# Patient Record
Sex: Male | Born: 1945 | Race: White | Hispanic: No | Marital: Married | Smoking: Never smoker
Health system: Southern US, Community
[De-identification: ages and names within clinical notes are randomized; demographics above are authoritative.]

## PROBLEM LIST (undated history)

## (undated) ENCOUNTER — Emergency Department (HOSPITAL_COMMUNITY): Source: Home / Self Care

## (undated) DIAGNOSIS — M1990 Unspecified osteoarthritis, unspecified site: Secondary | ICD-10-CM

## (undated) DIAGNOSIS — Z6841 Body Mass Index (BMI) 40.0 and over, adult: Secondary | ICD-10-CM

## (undated) DIAGNOSIS — Z87442 Personal history of urinary calculi: Secondary | ICD-10-CM

## (undated) DIAGNOSIS — G4730 Sleep apnea, unspecified: Secondary | ICD-10-CM

## (undated) DIAGNOSIS — I499 Cardiac arrhythmia, unspecified: Secondary | ICD-10-CM

## (undated) DIAGNOSIS — I10 Essential (primary) hypertension: Secondary | ICD-10-CM

## (undated) DIAGNOSIS — K5190 Ulcerative colitis, unspecified, without complications: Secondary | ICD-10-CM

## (undated) DIAGNOSIS — I739 Peripheral vascular disease, unspecified: Secondary | ICD-10-CM

## (undated) DIAGNOSIS — I82409 Acute embolism and thrombosis of unspecified deep veins of unspecified lower extremity: Secondary | ICD-10-CM

## (undated) HISTORY — PX: FEMUR SURGERY: SHX943

## (undated) HISTORY — PX: COLONOSCOPY W/ POLYPECTOMY: SHX1380

## (undated) HISTORY — DX: Cardiac arrhythmia, unspecified: I49.9

## (undated) HISTORY — DX: Essential (primary) hypertension: I10

## (undated) HISTORY — PX: JOINT REPLACEMENT: SHX530

---

## 1998-08-12 ENCOUNTER — Ambulatory Visit: Admission: RE | Admit: 1998-08-12 | Discharge: 1998-08-12 | Payer: Self-pay | Admitting: Internal Medicine

## 1999-01-29 ENCOUNTER — Encounter: Payer: Self-pay | Admitting: Orthopedic Surgery

## 1999-02-06 ENCOUNTER — Inpatient Hospital Stay (HOSPITAL_COMMUNITY): Admission: RE | Admit: 1999-02-06 | Discharge: 1999-02-12 | Payer: Self-pay | Admitting: Orthopedic Surgery

## 1999-02-06 ENCOUNTER — Encounter: Payer: Self-pay | Admitting: Orthopedic Surgery

## 1999-02-09 ENCOUNTER — Encounter: Payer: Self-pay | Admitting: Orthopedic Surgery

## 1999-09-25 ENCOUNTER — Encounter: Payer: Self-pay | Admitting: Orthopedic Surgery

## 1999-09-25 ENCOUNTER — Inpatient Hospital Stay (HOSPITAL_COMMUNITY): Admission: EM | Admit: 1999-09-25 | Discharge: 1999-09-26 | Payer: Self-pay | Admitting: Emergency Medicine

## 1999-09-25 ENCOUNTER — Encounter: Payer: Self-pay | Admitting: Emergency Medicine

## 2001-07-28 ENCOUNTER — Encounter: Payer: Self-pay | Admitting: Orthopedic Surgery

## 2001-07-28 ENCOUNTER — Encounter: Admission: RE | Admit: 2001-07-28 | Discharge: 2001-07-28 | Payer: Self-pay | Admitting: Orthopedic Surgery

## 2001-11-16 ENCOUNTER — Ambulatory Visit (HOSPITAL_COMMUNITY): Admission: RE | Admit: 2001-11-16 | Discharge: 2001-11-16 | Payer: Self-pay | Admitting: Gastroenterology

## 2003-11-06 ENCOUNTER — Inpatient Hospital Stay (HOSPITAL_COMMUNITY): Admission: EM | Admit: 2003-11-06 | Discharge: 2003-11-13 | Payer: Self-pay | Admitting: Emergency Medicine

## 2005-03-22 ENCOUNTER — Emergency Department (HOSPITAL_COMMUNITY): Admission: EM | Admit: 2005-03-22 | Discharge: 2005-03-22 | Payer: Self-pay | Admitting: Family Medicine

## 2011-02-04 ENCOUNTER — Other Ambulatory Visit: Payer: Self-pay | Admitting: Dermatology

## 2011-02-04 ENCOUNTER — Other Ambulatory Visit: Payer: Self-pay | Admitting: Gastroenterology

## 2011-02-19 ENCOUNTER — Other Ambulatory Visit (HOSPITAL_COMMUNITY): Payer: Self-pay | Admitting: Gastroenterology

## 2011-02-19 DIAGNOSIS — K635 Polyp of colon: Secondary | ICD-10-CM

## 2011-03-20 ENCOUNTER — Other Ambulatory Visit (HOSPITAL_COMMUNITY): Payer: Self-pay

## 2011-05-13 ENCOUNTER — Other Ambulatory Visit (HOSPITAL_COMMUNITY): Payer: Self-pay

## 2011-08-28 ENCOUNTER — Other Ambulatory Visit (HOSPITAL_COMMUNITY): Payer: Self-pay

## 2011-09-06 ENCOUNTER — Other Ambulatory Visit (HOSPITAL_COMMUNITY): Payer: Self-pay

## 2011-10-11 ENCOUNTER — Other Ambulatory Visit (HOSPITAL_COMMUNITY): Payer: Self-pay

## 2011-12-27 ENCOUNTER — Other Ambulatory Visit (HOSPITAL_COMMUNITY): Payer: Self-pay

## 2012-01-20 ENCOUNTER — Other Ambulatory Visit (HOSPITAL_COMMUNITY): Payer: Self-pay

## 2012-02-21 ENCOUNTER — Other Ambulatory Visit (HOSPITAL_COMMUNITY): Payer: Self-pay

## 2012-12-15 ENCOUNTER — Other Ambulatory Visit: Payer: Self-pay | Admitting: Gastroenterology

## 2012-12-15 DIAGNOSIS — Z86010 Personal history of colonic polyps: Secondary | ICD-10-CM

## 2013-01-04 ENCOUNTER — Encounter: Payer: Self-pay | Admitting: Podiatry

## 2013-01-04 ENCOUNTER — Ambulatory Visit (INDEPENDENT_AMBULATORY_CARE_PROVIDER_SITE_OTHER): Payer: PRIVATE HEALTH INSURANCE | Admitting: Podiatry

## 2013-01-04 VITALS — BP 127/65 | HR 75 | Resp 20 | Ht 72.0 in | Wt 320.0 lb

## 2013-01-04 DIAGNOSIS — L03039 Cellulitis of unspecified toe: Secondary | ICD-10-CM

## 2013-01-04 NOTE — Patient Instructions (Addendum)
Ingrown Toenail An ingrown toenail occurs when the sharp edge of a toenail grows into the skin of the groove beside the nail (lateral nail groove), causing pain. Ingrown toenails most commonly occur on the first (big) toe. If left untreated, an ingrown toenail may become inflamed and infected. The infection can even spread throughout the toe. SYMPTOMS   Pain, centered on the nail groove.  Tenderness and inflammation.  Pus drainage (occasionally).  Signs of infection: redness, swelling, pain, warm to the touch. CAUSES  Ingrown toenails are caused when the toenail grows into the neighboring fleshy nail fold. This may be the result of poor nail trimming or pressure from shoes.  RISK INCREASES WITH:   Curved nail formations.  Clipping toenails too far on the sides, which allows tissue to grow over the nail.  Poorly fitted shoes, especially those that press down on the toenails.  Sports that require sudden stops, causing the toe to jam into the shoe. PREVENTION   Trim toenails properly, making sure the sides are not clipped too short.  Wear properly fitted shoes.  Avoid excessive pressure on the toenails. PROGNOSIS  Ingrown toenails are usually curable within 1 week, depending on the presence or absence of an infection.  RELATED COMPLICATIONS   Chronic infection, that cannot be cured without surgery.  Spread of infection throughout the toe, or even the bone. TREATMENT  Treatment first involves resting from aggravating activities, wearing shoes that do not place pressure on the toenails, antibiotics (if infected), and soaking the foot in warm water (with or without Epsom salt). After the foot has been soaked, you may be able to gently lift the nail from the fleshy tissue, and place a bit of cotton ball under the nail, easing the pressure. If you have been prescribed antibiotics, expect relief to begin up to 48 hours after taking the medicine. Symptoms usually go away within 1 week. For  people who suffer from persistent (chronic) ingrown toenails, surgery may be advised. MEDICATION   If pain medicine is needed, nonsteroidal anti-inflammatory medicines (aspirin and ibuprofen), or other minor pain relievers (acetaminophen), are often advised.  Do not take pain medicine for 7 days before surgery.  Stronger pain relievers may be prescribed, if your caregiver thinks they are needed. Use only as directed and only as much as you need.  Antibiotics may be prescribed to fight infection. Take as directed by your caregiver. Finish the entire prescription, even if you start to feel better before you finish it. SOAKS AND COLD THERAPY   Soak the toe (or whole foot) for 20 minutes, twice a day, in a gallon of warm water. You may add 2 tablespoons of Epsom salts or a mild detergent.  Cold treatment (icing) should be applied for 10 to 15 minutes every 2 to 3 hours for inflammation and pain, and immediately after activity that aggravates your symptoms. Use ice packs or an ice massage. SEEK MEDICAL CARE IF:   Symptoms get worse or do not improve in 48 hours, despite treatment.  You develop fever, increased pain and swelling, or signs of infection (pain, redness, tenderness, swelling, warmth) in the toe, after surgery.  New, unexplained symptoms develop. (Drugs used in treatment may produce side effects.) Document Released: 01/07/2005 Document Revised: 04/01/2011 Document Reviewed: 04/21/2008 [facility] Patient Information 2014 [place], [place].   Betadine Soak Instructions  Purchase an 8 oz. bottle of BETADINE solution (Povidone) ANTIBACTERIAL SOAP INSTRUCTIONS  THE DAY AFTER PROCEDURE  Please follow the instructions your doctor has marked.  Shower as usual. Before getting out, place a drop of antibacterial liquid soap (Dial) on a wet, clean washcloth.  Gently wipe washcloth over affected area.  Afterward, rinse the area with warm water.  Blot the area dry with a soft cloth and cover  with antibiotic ointment (neosporin, polysporin, bacitracin) and band aid or gauze and tape  Place 3-4 drops of antibacterial liquid soap in a quart of warm tap water.  Submerge foot into water for 20 minutes.  If bandage was applied after your procedure, leave on to allow for easy lift off, then remove and continue with soak for the remaining time.  Next, blot area dry with a soft cloth and cover with a bandage.  Apply other medications as directed by your doctor, such as cortisporin otic solution (eardrops) or neosporin antibiotic ointment

## 2013-01-04 NOTE — Progress Notes (Signed)
Subjective:     Patient ID: Steven Blair, male   DOB: 1945-04-28, 67 y.o.   MRN: [id number]  HPI patient states that he has a painful ingrown toenail of his left big toe and a rash on top of his right ankle which has been present for a number of years. States the toenail gets sore in it's mostly border towards the second toe   Review of Systems  All other systems reviewed and are negative.       Objective:   Physical Exam  Nursing note and vitals reviewed. Constitutional: He is oriented to person, place, and time. He appears well-developed and well-nourished.  Cardiovascular: Intact distal pulses.   Musculoskeletal: Normal range of motion.  Neurological: He is oriented to person, place, and time.  Skin: Skin is warm.   neurovascular status was moderately diminished and I noted diminished hair growth on both feet. I questioned him on claudication symptoms and he does not appear to have claudication at this time. Left hallux is incurvated lateral border and painful when pressed with the thickened nail and slight red discoloration and dorsum of the right foot has some irritation also     Assessment:     Incurvated left hallux lateral border this painful with mild paronychia infection and rash dorsum right foot appears to be some kind of a localized process    Plan:     H&P was performed and discussed and today I infiltrated the left hallux remove the lateral border proud flesh allowed channel for drainage and applied sterile dressing. Reappoint again in 4 weeks to evaluate healing and consider sending him for circulatory studies depending on response

## 2013-01-04 NOTE — Progress Notes (Signed)
   Subjective:    Patient ID: Steven Blair, male    DOB: Jun 08, 1945, 67 y.o.   MRN: [id number]  HPI Comments: "I've got an ingrown toenail and a rash or something"  Pt has ingrown toenail 1st toe left, both corners for approx few mos. Been trimming back when can.   Pt also has rash area that is red and "crusty" for approx several mos. Been using antibiotic ointment on it. Not sore or itchy.     Review of Systems  Skin: Positive for rash.       Open sores  All other systems reviewed and are negative.       Objective:   Physical Exam        Assessment & Plan:

## 2013-02-01 ENCOUNTER — Ambulatory Visit: Payer: PRIVATE HEALTH INSURANCE | Admitting: Podiatry

## 2013-02-01 ENCOUNTER — Other Ambulatory Visit: Payer: Self-pay

## 2013-03-29 ENCOUNTER — Other Ambulatory Visit: Payer: Self-pay

## 2013-04-28 ENCOUNTER — Other Ambulatory Visit: Payer: Self-pay | Admitting: Gastroenterology

## 2013-05-24 ENCOUNTER — Other Ambulatory Visit: Payer: Self-pay

## 2013-06-07 ENCOUNTER — Other Ambulatory Visit: Payer: Self-pay

## 2013-07-05 ENCOUNTER — Other Ambulatory Visit: Payer: Self-pay

## 2013-08-02 ENCOUNTER — Other Ambulatory Visit: Payer: Self-pay

## 2013-11-22 ENCOUNTER — Other Ambulatory Visit: Payer: Self-pay

## 2014-01-21 HISTORY — PX: EYE SURGERY: SHX253

## 2014-02-14 ENCOUNTER — Other Ambulatory Visit: Payer: Self-pay | Admitting: Dermatology

## 2014-10-27 ENCOUNTER — Other Ambulatory Visit: Payer: Self-pay | Admitting: Gastroenterology

## 2014-10-31 ENCOUNTER — Observation Stay (HOSPITAL_COMMUNITY)
Admission: EM | Admit: 2014-10-31 | Discharge: 2014-11-02 | Disposition: A | Discharge status: Home / Self Care | Payer: No Typology Code available for payment source | Attending: Internal Medicine | Admitting: Internal Medicine

## 2014-10-31 ENCOUNTER — Encounter (HOSPITAL_COMMUNITY): Payer: Self-pay | Admitting: *Deleted

## 2014-10-31 ENCOUNTER — Emergency Department (HOSPITAL_COMMUNITY): Payer: No Typology Code available for payment source

## 2014-10-31 DIAGNOSIS — G4730 Sleep apnea, unspecified: Secondary | ICD-10-CM | POA: Diagnosis not present

## 2014-10-31 DIAGNOSIS — I4891 Unspecified atrial fibrillation: Secondary | ICD-10-CM | POA: Diagnosis not present

## 2014-10-31 DIAGNOSIS — E7800 Pure hypercholesterolemia, unspecified: Secondary | ICD-10-CM | POA: Diagnosis not present

## 2014-10-31 DIAGNOSIS — N200 Calculus of kidney: Secondary | ICD-10-CM | POA: Diagnosis not present

## 2014-10-31 DIAGNOSIS — Z7982 Long term (current) use of aspirin: Secondary | ICD-10-CM | POA: Insufficient documentation

## 2014-10-31 DIAGNOSIS — I480 Paroxysmal atrial fibrillation: Secondary | ICD-10-CM | POA: Diagnosis not present

## 2014-10-31 DIAGNOSIS — M19011 Primary osteoarthritis, right shoulder: Secondary | ICD-10-CM | POA: Diagnosis not present

## 2014-10-31 DIAGNOSIS — Z86718 Personal history of other venous thrombosis and embolism: Secondary | ICD-10-CM

## 2014-10-31 DIAGNOSIS — Z86010 Personal history of colonic polyps: Secondary | ICD-10-CM | POA: Diagnosis not present

## 2014-10-31 DIAGNOSIS — Z9889 Other specified postprocedural states: Secondary | ICD-10-CM | POA: Insufficient documentation

## 2014-10-31 DIAGNOSIS — K439 Ventral hernia without obstruction or gangrene: Secondary | ICD-10-CM | POA: Insufficient documentation

## 2014-10-31 DIAGNOSIS — Z6841 Body Mass Index (BMI) 40.0 and over, adult: Secondary | ICD-10-CM | POA: Diagnosis not present

## 2014-10-31 DIAGNOSIS — E43 Unspecified severe protein-calorie malnutrition: Secondary | ICD-10-CM | POA: Diagnosis not present

## 2014-10-31 DIAGNOSIS — K922 Gastrointestinal hemorrhage, unspecified: Secondary | ICD-10-CM | POA: Diagnosis present

## 2014-10-31 DIAGNOSIS — G4733 Obstructive sleep apnea (adult) (pediatric): Secondary | ICD-10-CM | POA: Diagnosis not present

## 2014-10-31 DIAGNOSIS — A09 Infectious gastroenteritis and colitis, unspecified: Secondary | ICD-10-CM

## 2014-10-31 DIAGNOSIS — R1084 Generalized abdominal pain: Secondary | ICD-10-CM | POA: Diagnosis not present

## 2014-10-31 DIAGNOSIS — R197 Diarrhea, unspecified: Secondary | ICD-10-CM | POA: Diagnosis present

## 2014-10-31 DIAGNOSIS — K5150 Left sided colitis without complications: Secondary | ICD-10-CM | POA: Insufficient documentation

## 2014-10-31 DIAGNOSIS — K529 Noninfective gastroenteritis and colitis, unspecified: Secondary | ICD-10-CM | POA: Diagnosis present

## 2014-10-31 DIAGNOSIS — Z7901 Long term (current) use of anticoagulants: Secondary | ICD-10-CM | POA: Diagnosis not present

## 2014-10-31 DIAGNOSIS — I10 Essential (primary) hypertension: Secondary | ICD-10-CM | POA: Diagnosis present

## 2014-10-31 DIAGNOSIS — I739 Peripheral vascular disease, unspecified: Secondary | ICD-10-CM | POA: Insufficient documentation

## 2014-10-31 DIAGNOSIS — Z882 Allergy status to sulfonamides status: Secondary | ICD-10-CM | POA: Insufficient documentation

## 2014-10-31 DIAGNOSIS — K5130 Ulcerative (chronic) rectosigmoiditis without complications: Principal | ICD-10-CM | POA: Insufficient documentation

## 2014-10-31 LAB — URINALYSIS, ROUTINE W REFLEX MICROSCOPIC
Glucose, UA: NEGATIVE mg/dL
Ketones, ur: 15 mg/dL — AB
Leukocytes, UA: NEGATIVE
Nitrite: NEGATIVE
Protein, ur: NEGATIVE mg/dL
Specific Gravity, Urine: 1.027 (ref 1.005–1.030)
Urobilinogen, UA: 1 mg/dL (ref 0.0–1.0)
pH: 5.5 (ref 5.0–8.0)

## 2014-10-31 LAB — CBC
HCT: 39 % (ref 39.0–52.0)
Hemoglobin: 13.1 g/dL (ref 13.0–17.0)
MCH: 32.3 pg (ref 26.0–34.0)
MCHC: 33.6 g/dL (ref 30.0–36.0)
MCV: 96.1 fL (ref 78.0–100.0)
Platelets: 392 10*3/uL (ref 150–400)
RBC: 4.06 MIL/uL — ABNORMAL LOW (ref 4.22–5.81)
RDW: 12.2 % (ref 11.5–15.5)
WBC: 9.8 10*3/uL (ref 4.0–10.5)

## 2014-10-31 LAB — COMPREHENSIVE METABOLIC PANEL
ALT: 43 U/L (ref 17–63)
AST: 36 U/L (ref 15–41)
Albumin: 2.8 g/dL — ABNORMAL LOW (ref 3.5–5.0)
Alkaline Phosphatase: 59 U/L (ref 38–126)
Anion gap: 13 (ref 5–15)
BUN: 42 mg/dL — ABNORMAL HIGH (ref 6–20)
CO2: 24 mmol/L (ref 22–32)
Calcium: 8.4 mg/dL — ABNORMAL LOW (ref 8.9–10.3)
Chloride: 96 mmol/L — ABNORMAL LOW (ref 101–111)
Creatinine, Ser: 1.25 mg/dL — ABNORMAL HIGH (ref 0.61–1.24)
GFR calc Af Amer: >60 mL/min (ref >60)
GFR calc non Af Amer: 57 mL/min — ABNORMAL LOW (ref >60)
Glucose, Bld: 136 mg/dL — ABNORMAL HIGH (ref 65–99)
Potassium: 3.5 mmol/L (ref 3.5–5.1)
Sodium: 133 mmol/L — ABNORMAL LOW (ref 135–145)
Total Bilirubin: 1.1 mg/dL (ref 0.3–1.2)
Total Protein: 6.7 g/dL (ref 6.5–8.1)

## 2014-10-31 LAB — URINE MICROSCOPIC-ADD ON

## 2014-10-31 LAB — TYPE AND SCREEN
ABO/RH(D): O POS
Antibody Screen: NEGATIVE

## 2014-10-31 LAB — SAMPLE TO BLOOD BANK

## 2014-10-31 LAB — ABO/RH: ABO/RH(D): O POS

## 2014-10-31 LAB — POC OCCULT BLOOD, ED: Fecal Occult Bld: POSITIVE — AB

## 2014-10-31 LAB — LIPASE, BLOOD: Lipase: 19 U/L — ABNORMAL LOW (ref 22–51)

## 2014-10-31 MED ORDER — SODIUM CHLORIDE 0.9 % IJ SOLN
3.0000 mL | Freq: Two times a day (BID) | INTRAMUSCULAR | Status: DC
  Administered 2014-11-01 – 2014-11-02 (×4): 3 mL via INTRAVENOUS

## 2014-10-31 MED ORDER — IOHEXOL 300 MG/ML  SOLN
50.0000 mL | Freq: Once | INTRAMUSCULAR | Status: AC | PRN
  Administered 2014-10-31: 50 mL via ORAL

## 2014-10-31 MED ORDER — MORPHINE SULFATE (PF) 4 MG/ML IV SOLN
4.0000 mg | Freq: Once | INTRAVENOUS | Status: AC
  Administered 2014-10-31: 4 mg via INTRAVENOUS
  Filled 2014-10-31: qty 1

## 2014-10-31 MED ORDER — PREDNISONE 20 MG PO TABS
40.0000 mg | ORAL_TABLET | Freq: Every day | ORAL | Status: DC
  Administered 2014-11-01 – 2014-11-02 (×3): 40 mg via ORAL
  Filled 2014-10-31 (×3): qty 2

## 2014-10-31 MED ORDER — SODIUM CHLORIDE 0.9 % IV SOLN
INTRAVENOUS | Status: DC
  Administered 2014-11-01 (×2): via INTRAVENOUS

## 2014-10-31 MED ORDER — ACETAMINOPHEN 650 MG RE SUPP: 650.0000 mg | Freq: Four times a day (QID) | RECTAL | Status: DC | PRN

## 2014-10-31 MED ORDER — SODIUM CHLORIDE 0.9 % IV BOLUS (SEPSIS)
500.0000 mL | Freq: Once | INTRAVENOUS | Status: AC
  Administered 2014-10-31: 500 mL via INTRAVENOUS

## 2014-10-31 MED ORDER — CIPROFLOXACIN IN D5W 400 MG/200ML IV SOLN
400.0000 mg | Freq: Two times a day (BID) | INTRAVENOUS | Status: DC
  Administered 2014-11-01 (×2): 400 mg via INTRAVENOUS
  Filled 2014-10-31 (×2): qty 200

## 2014-10-31 MED ORDER — SODIUM CHLORIDE 0.9 % IV SOLN: INTRAVENOUS | Status: DC

## 2014-10-31 MED ORDER — ONDANSETRON HCL 4 MG/2ML IJ SOLN: 4.0000 mg | Freq: Four times a day (QID) | INTRAMUSCULAR | Status: DC | PRN

## 2014-10-31 MED ORDER — METRONIDAZOLE IN NACL 5-0.79 MG/ML-% IV SOLN
500.0000 mg | Freq: Three times a day (TID) | INTRAVENOUS | Status: DC
  Administered 2014-11-01 – 2014-11-02 (×6): 500 mg via INTRAVENOUS
  Filled 2014-10-31 (×7): qty 100

## 2014-10-31 MED ORDER — SODIUM CHLORIDE 0.9 % IV SOLN
INTRAVENOUS | Status: DC
  Administered 2014-10-31 (×2): via INTRAVENOUS

## 2014-10-31 MED ORDER — ACETAMINOPHEN 325 MG PO TABS
650.0000 mg | ORAL_TABLET | Freq: Four times a day (QID) | ORAL | Status: DC | PRN
  Administered 2014-10-31 – 2014-11-02 (×4): 650 mg via ORAL
  Filled 2014-10-31 (×4): qty 2

## 2014-10-31 MED ORDER — PANTOPRAZOLE SODIUM 40 MG IV SOLR
40.0000 mg | Freq: Two times a day (BID) | INTRAVENOUS | Status: DC
  Administered 2014-11-01 – 2014-11-02 (×4): 40 mg via INTRAVENOUS
  Filled 2014-10-31 (×4): qty 40

## 2014-10-31 MED ORDER — ONDANSETRON HCL 4 MG PO TABS: 4.0000 mg | ORAL_TABLET | Freq: Four times a day (QID) | ORAL | Status: DC | PRN

## 2014-10-31 MED ORDER — LEVOFLOXACIN IN D5W 500 MG/100ML IV SOLN
500.0000 mg | Freq: Once | INTRAVENOUS | Status: DC
  Administered 2014-10-31: 500 mg via INTRAVENOUS
  Filled 2014-10-31: qty 100

## 2014-10-31 MED ORDER — IOHEXOL 300 MG/ML  SOLN
100.0000 mL | Freq: Once | INTRAMUSCULAR | Status: AC | PRN
  Administered 2014-10-31: 100 mL via INTRAVENOUS

## 2014-10-31 MED ORDER — METRONIDAZOLE IN NACL 5-0.79 MG/ML-% IV SOLN
500.0000 mg | Freq: Once | INTRAVENOUS | Status: DC
  Filled 2014-10-31: qty 100

## 2014-10-31 NOTE — ED Notes (Signed)
Pt to CT

## 2014-10-31 NOTE — ED Notes (Signed)
Pt reports bright red blood rectal bleeding today with dizziness.  Has had hx of internal bleeding.  Has scheduled colonoscopy tomorrow.  Pt also reports decreased appetite.

## 2014-10-31 NOTE — ED Provider Notes (Signed)
CSN: [id number]     Arrival date & time 10/31/14  1437 History   First MD Initiated Contact with Patient 10/31/14 1509     Chief Complaint  Patient presents with  . Rectal Bleeding     (Consider location/radiation/quality/duration/timing/severity/associated sxs/prior Treatment) HPI To Dr. Wynetta Emery (GI) last week. Started on prednisone for bouts of bleeding and "colon irritation". Today large bout of passing bright blood with dark, mucousy content. Gas and bloating this week-end, BMs about every 30 minutes. Only eating Jello, gatorade, Ensure, eaten very little this week-end. Light headed over weekend, worse today when out shopping. Cramping low abdominal pain for greater then 3 days. Scheduled for colonoscopy tomorrow. H/O DVT, off Xarelto 3-4 weeks, wasn't bleeding while taking Xarelto. No fever, no vomiting, no urinary symptoms except decreased urine output. Past Medical History  Diagnosis Date  . Hypertension   . Sleep apnea     no cpap used, couldn't tolerate  . DVT of leg (deep venous thrombosis) ([facility])     left leg tx. Xarelto - no longer taking  . Dysrhythmia     Atrial Fib x1 episode, cardioversion 4'16- NSR  . History of kidney stones     x1 -20 yrs ago  . Ulcerative colitis ([place])     history of , some current issues at this time  . BMI 40.0-44.9, adult ([place])     Greater than 40 BMI   Past Surgical History  Procedure Laterality Date  . Joint replacement      BTHA  . Femur surgery Left     ORIF 10'05  . Cardioversion      tx Paroxysmal Atrial Fibrillation- converted to NSR  . Colonoscopy w/ polypectomy     No family history on file. Social History  Substance Use Topics  . Smoking status: Never Smoker   . Smokeless tobacco: None  . Alcohol Use: Yes     Comment: occ. social    Review of Systems  10 Systems reviewed and are negative for acute change except as noted in the HPI.  Allergies  Sulfa antibiotics  Home Medications   Prior to Admission medications    Medication Sig Start Date End Date Taking? Authorizing Provider  losartan-hydrochlorothiazide (HYZAAR) 100-25 MG tablet Take 1 tablet by mouth every morning.   Yes Historical Provider, MD  oxymetazoline (AFRIN) 0.05 % nasal spray Place 1 spray into both nostrils daily as needed for congestion.   Yes Historical Provider, MD  predniSONE (DELTASONE) 20 MG tablet Take 40 mg by mouth daily. 10/27/14  Yes Historical Provider, MD  aspirin 81 MG tablet Take 81 mg by mouth daily.    Historical Provider, MD  COLOCORT 100 MG/60ML enema Place 1 enema rectally daily. 10/21/14   Historical Provider, MD  GAVILYTE-N WITH FLAVOR PACK 420 G solution Take 1 Dose by mouth once. 10/27/14   Historical Provider, MD  naproxen sodium (ANAPROX) 220 MG tablet Take 220 mg by mouth 2 (two) times daily with a meal.    Historical Provider, MD   BP 116/55 mmHg  Pulse 84  Temp(Src) 98 F (36.7 C) (Oral)  Resp 15  SpO2 98% Physical Exam  Constitutional: He is oriented to person, place, and time. He appears well-developed and well-nourished.  HENT:  Head: Normocephalic and atraumatic.  Eyes: EOM are normal. Pupils are equal, round, and reactive to light.  Neck: Neck supple.  Cardiovascular: Normal rate, regular rhythm, normal heart sounds and intact distal pulses.   Pulmonary/Chest: Effort normal and breath  sounds normal.  Abdominal: Soft. Bowel sounds are normal. He exhibits no distension. There is no tenderness.  Genitourinary: Guaiac positive stool.  Fresh red blood staining the gluteal region. Digital exam has melanotic stool and significant tenderness of the rectal vault.  Musculoskeletal: Normal range of motion. He exhibits no edema.  Neurological: He is alert and oriented to person, place, and time. He has normal strength. Coordination normal. GCS eye subscore is 4. GCS verbal subscore is 5. GCS motor subscore is 6.  Skin: Skin is warm, dry and intact.  Psychiatric: He has a normal mood and affect.    ED Course   Procedures (including critical care time) Labs Review Labs Reviewed  LIPASE, BLOOD - Abnormal; Notable for the following:    Lipase 19 (*)    All other components within normal limits  COMPREHENSIVE METABOLIC PANEL - Abnormal; Notable for the following:    Sodium 133 (*)    Chloride 96 (*)    Glucose, Bld 136 (*)    BUN 42 (*)    Creatinine, Ser 1.25 (*)    Calcium 8.4 (*)    Albumin 2.8 (*)    GFR calc non Af Amer 57 (*)    All other components within normal limits  CBC - Abnormal; Notable for the following:    RBC 4.06 (*)    All other components within normal limits  URINALYSIS, ROUTINE W REFLEX MICROSCOPIC (NOT AT [facility]) - Abnormal; Notable for the following:    Color, Urine AMBER (*)    APPearance CLOUDY (*)    Hgb urine dipstick TRACE (*)    Bilirubin Urine SMALL (*)    Ketones, ur 15 (*)    All other components within normal limits  URINE MICROSCOPIC-ADD ON - Abnormal; Notable for the following:    Bacteria, UA MANY (*)    Casts HYALINE CASTS (*)    All other components within normal limits  POC OCCULT BLOOD, ED - Abnormal; Notable for the following:    Fecal Occult Bld POSITIVE (*)    All other components within normal limits  SAMPLE TO BLOOD BANK  TYPE AND SCREEN  ABO/RH    Imaging Review Ct Abdomen Pelvis W Contrast  10/31/2014   CLINICAL DATA:  Bright red blood rectal bleeding today with dizziness. Scheduled colonoscopy tomorrow. Generalized abdominal pain.  EXAM: CT ABDOMEN AND PELVIS WITH CONTRAST  TECHNIQUE: Multidetector CT imaging of the abdomen and pelvis was performed using the standard protocol following bolus administration of intravenous contrast.  CONTRAST:  67m OMNIPAQUE IOHEXOL 300 MG/ML SOLN, 1059mOMNIPAQUE IOHEXOL 300 MG/ML SOLN  COMPARISON:  None.  FINDINGS: Lower chest:  No acute findings.  Hepatobiliary: Liver appears normal. Gallbladder is mildly distended. There are small layering stones and/or small amount of sludge within the gallbladder.  No pericholecystic inflammation or other signs of acute cholecystitis. No extrahepatic or intrahepatic bile duct dilatation.  Pancreas: No mass, inflammatory changes, or other significant abnormality.  Spleen: Within normal limits in size and appearance.  Adrenals/Urinary Tract: There is a 4 mm nonobstructing right renal stone, midpole region, and 6 mm nonobstructing stone within the lower pole right renal pelvis. There is a simple cyst within the upper pole of the left kidney. Kidneys otherwise normal. No hydronephrosis bilaterally. No ureteral or bladder calculi identified. Bladder is unremarkable, although partially obscured by metallic artifact from patient's bilateral hip prostheses.  Stomach/Bowel: There is mild thickening of the walls of the proximal transverse colon and upper ascending colon, centered at  the hepatic flexure. Suspect mild thickening of the walls of the sigmoid colon as well, although difficult to characterize due to inadequate distention. Stomach and small bowel appears normal throughout. No pericolic fluid collection or inflammatory stranding. No pericolic abscess collection. No dilated large or small bowel loops. No evidence of bowel perforation. Appendix is normal.  Vascular/Lymphatic: There are no enlarged lymph nodes identified. Atherosclerotic changes are seen along the walls of the normal- caliber abdominal aorta.  Reproductive: No mass or other significant abnormality.  Other: No free fluid or abscess collection. No free intraperitoneal air.  Musculoskeletal: No suspicious bone lesions identified. Extensive degenerative changes seen throughout the thoracolumbar spine but no acute osseous abnormality. There is a small periumbilical abdominal wall hernia which contains fat only. As above, there are bilateral hip prostheses which appear well position.  IMPRESSION: 1. At least mild thickening of the walls of the proximal transverse colon and upper descending colon, centered at the hepatic  flexure, suggesting acute colitis of an infectious or inflammatory nature. Perhaps mild thickening of the walls of the sigmoid colon as well, but not as convincing. No paracolic fluid collection or abscess. No evidence of bowel perforation (no free intraperitoneal air). No associated bowel obstruction. 2. Cholelithiasis without evidence of acute cholecystitis. 3. Right nephrolithiasis without hydronephrosis. 4. Left renal cyst.   Electronically Signed   By: Franki Cabot M.D.   On: 10/31/2014 19:02   I have personally reviewed and evaluated these images and lab results as part of my medical decision-making.   EKG Interpretation None       Recheck: Pain significantly improved with morphine. Patient remains alert and appropriate.  Consult: Reviewed with Dr. Paulita Fujita. We will plan to admit the patient on Cipro Flagyl and he will be seen in the morning for recheck by GI. Admission to hospitalist. MDM   Final diagnoses:  Colitis  Lower GI bleed   Patient presents with worsening colitis. Symptoms initially were for cramping and bloating with occasional blood-tinged stool. Over the past 24 hours patient has had significantly increased abdominal pain as well as explosive bloody stool. CT shows mild inflammatory changes without focal abscess or perforation. Digital examination does reveal significant tenderness of the rectum. At this time the patient will be initiated on Levaquin Flagyl combination and admitted for observation for lower GI bleed with colitis.    Charlesetta Shanks, MD 10/31/14 2118

## 2014-10-31 NOTE — H&P (Signed)
[facility] History and Physical  Patient: Steven Blair  MRN: [id number]  DOB: 1945-04-28  DOS: the patient was seen and examined on 10/31/2014 PCP: Henrine Screws, MD  Referring physician: Dr. Vallery Ridge Chief Complaint: GI bleed  HPI: Steven Blair is a 69 y.o. male with Past medical history of hypertension, sleep apnea not on C Pap, recent treatment for DVT which completed 4 weeks ago, history of ulcerative colitis, BMI more than 40. Patient presents with complaints of diarrhea as well as mild right-sided stomach pain and bright red blood per rectum. Patient mentions that he has been having loose watery stool with some blood mixed which is ongoing for last 3 weeks. He also had some right-sided mild crampy abdominal pain. He denies any nausea or vomiting no fever no chills no recent antibiotics use. Patient was on anticoagulation for DVT diagnosed in January and completed the treatment 4 weeks ago. Patient denies being on any antibiotics are being any recent hospitalization. Since last 3 days he has lost his appetite and has not been eating anything significant. He has seen his GI doctor as an outpatient will put him on 40 mg of prednisone. Patient was also scheduled for a colonoscopy tomorrow and was scheduled for a bowel prep starting tonight. While at home the patient started feeling dizzy and lightheaded and feeling more tired today. This was followed by copious amount of bright red blood per rectum. In this further later to more dizziness and therefore he decided to come to the hospital. At the time of my evaluation patient mentions his pain is minimal denies having any nausea or denies having any chest pain or shortness of breath or dizziness. No further bowel movement reported since his arrival.  The patient is coming from home.  At his baseline ambulates without support And is independent for most of his ADL; manages his medication on his own.  Review of Systems:  as mentioned in the history of present illness.  A comprehensive review of the other systems is negative.  Past Medical History  Diagnosis Date  . Hypertension   . Sleep apnea     no cpap used, couldn't tolerate  . DVT of leg (deep venous thrombosis) (HCC)     left leg tx. Xarelto - no longer taking  . Dysrhythmia     Atrial Fib x1 episode, cardioversion 4'16- NSR  . History of kidney stones     x1 -20 yrs ago  . Ulcerative colitis ([place])     history of , some current issues at this time  . BMI 40.0-44.9, adult ([place])     Greater than 40 BMI   Past Surgical History  Procedure Laterality Date  . Joint replacement      BTHA  . Femur surgery Left     ORIF 10'05  . Cardioversion      tx Paroxysmal Atrial Fibrillation- converted to NSR  . Colonoscopy w/ polypectomy     Social History:  reports that he has never smoked. He does not have any smokeless tobacco history on file. He reports that he drinks alcohol. He reports that he does not use illicit drugs.  Allergies  Allergen Reactions  . Sulfa Antibiotics Other (See Comments)    Childhood allergy    No family history on file.  Prior to Admission medications   Medication Sig Start Date End Date Taking? Authorizing Provider  losartan-hydrochlorothiazide (HYZAAR) 100-25 MG tablet Take 1 tablet by mouth every morning.   Yes Historical Provider,  MD  oxymetazoline (AFRIN) 0.05 % nasal spray Place 1 spray into both nostrils daily as needed for congestion.   Yes Historical Provider, MD  predniSONE (DELTASONE) 20 MG tablet Take 40 mg by mouth daily. 10/27/14  Yes Historical Provider, MD  aspirin 81 MG tablet Take 81 mg by mouth daily.    Historical Provider, MD  COLOCORT 100 MG/60ML enema Place 1 enema rectally daily. 10/21/14   Historical Provider, MD  GAVILYTE-N WITH FLAVOR PACK 420 G solution Take 1 Dose by mouth once. 10/27/14   Historical Provider, MD  naproxen sodium (ANAPROX) 220 MG tablet Take 220 mg by mouth 2 (two) times daily  with a meal.    Historical Provider, MD    Physical Exam: Filed Vitals:   10/31/14 1856 10/31/14 2034 10/31/14 2234 10/31/14 2243  BP: 135/75 116/55  127/86  Pulse: 87 84  96  Temp:    98.7 F (37.1 C)  TempSrc:    Oral  Resp: 17 15  16   Height:   6' 1"  (1.854 m)   Weight:   138.166 kg (304 lb 9.6 oz)   SpO2: 97% 98%  100%    General: Alert, Awake and Oriented to Time, Place and Person. Appear in mild distress Eyes: PERRL ENT: Oral Mucosa clear moist. Neck: no JVD Cardiovascular: S1 and S2 Present, no Murmur, Peripheral Pulses Present Respiratory: Bilateral Air entry equal and Decreased,  Clear to Auscultation, no Crackles, no wheezes Abdomen: Bowel Sound present, Soft and no tenderness Skin: no Rash Extremities: no Pedal edema, no calf tenderness Neurologic: Grossly no focal neuro deficit.  Labs on Admission:  CBC:  Recent Labs Lab 10/31/14 1450  WBC 9.8  HGB 13.1  HCT 39.0  MCV 96.1  PLT 392    CMP     Component Value Date/Time   NA 133* 10/31/2014 1450   K 3.5 10/31/2014 1450   CL 96* 10/31/2014 1450   CO2 24 10/31/2014 1450   GLUCOSE 136* 10/31/2014 1450   BUN 42* 10/31/2014 1450   CREATININE 1.25* 10/31/2014 1450   CALCIUM 8.4* 10/31/2014 1450   PROT 6.7 10/31/2014 1450   ALBUMIN 2.8* 10/31/2014 1450   AST 36 10/31/2014 1450   ALT 43 10/31/2014 1450   ALKPHOS 59 10/31/2014 1450   BILITOT 1.1 10/31/2014 1450   GFRNONAA 57* 10/31/2014 1450   GFRAA >60 10/31/2014 1450    No results for input(s): CKTOTAL, CKMB, CKMBINDEX, TROPONINI in the last 168 hours. BNP (last 3 results) No results for input(s): BNP in the last 8760 hours.  ProBNP (last 3 results) No results for input(s): PROBNP in the last 8760 hours.   Radiological Exams on Admission: Ct Abdomen Pelvis W Contrast  10/31/2014   CLINICAL DATA:  Bright red blood rectal bleeding today with dizziness. Scheduled colonoscopy tomorrow. Generalized abdominal pain.  EXAM: CT ABDOMEN AND PELVIS  WITH CONTRAST  TECHNIQUE: Multidetector CT imaging of the abdomen and pelvis was performed using the standard protocol following bolus administration of intravenous contrast.  CONTRAST:  38m OMNIPAQUE IOHEXOL 300 MG/ML SOLN, 1055mOMNIPAQUE IOHEXOL 300 MG/ML SOLN  COMPARISON:  None.  FINDINGS: Lower chest:  No acute findings.  Hepatobiliary: Liver appears normal. Gallbladder is mildly distended. There are small layering stones and/or small amount of sludge within the gallbladder. No pericholecystic inflammation or other signs of acute cholecystitis. No extrahepatic or intrahepatic bile duct dilatation.  Pancreas: No mass, inflammatory changes, or other significant abnormality.  Spleen: Within normal limits in size and appearance.  Adrenals/Urinary Tract: There is a 4 mm nonobstructing right renal stone, midpole region, and 6 mm nonobstructing stone within the lower pole right renal pelvis. There is a simple cyst within the upper pole of the left kidney. Kidneys otherwise normal. No hydronephrosis bilaterally. No ureteral or bladder calculi identified. Bladder is unremarkable, although partially obscured by metallic artifact from patient's bilateral hip prostheses.  Stomach/Bowel: There is mild thickening of the walls of the proximal transverse colon and upper ascending colon, centered at the hepatic flexure. Suspect mild thickening of the walls of the sigmoid colon as well, although difficult to characterize due to inadequate distention. Stomach and small bowel appears normal throughout. No pericolic fluid collection or inflammatory stranding. No pericolic abscess collection. No dilated large or small bowel loops. No evidence of bowel perforation. Appendix is normal.  Vascular/Lymphatic: There are no enlarged lymph nodes identified. Atherosclerotic changes are seen along the walls of the normal- caliber abdominal aorta.  Reproductive: No mass or other significant abnormality.  Other: No free fluid or abscess  collection. No free intraperitoneal air.  Musculoskeletal: No suspicious bone lesions identified. Extensive degenerative changes seen throughout the thoracolumbar spine but no acute osseous abnormality. There is a small periumbilical abdominal wall hernia which contains fat only. As above, there are bilateral hip prostheses which appear well position.  IMPRESSION: 1. At least mild thickening of the walls of the proximal transverse colon and upper descending colon, centered at the hepatic flexure, suggesting acute colitis of an infectious or inflammatory nature. Perhaps mild thickening of the walls of the sigmoid colon as well, but not as convincing. No paracolic fluid collection or abscess. No evidence of bowel perforation (no free intraperitoneal air). No associated bowel obstruction. 2. Cholelithiasis without evidence of acute cholecystitis. 3. Right nephrolithiasis without hydronephrosis. 4. Left renal cyst.   Electronically Signed   By: Franki Cabot M.D.   On: 10/31/2014 19:02   Assessment/Plan 1. Colitis Patient presents with complaints of bright red blood per rectum with dizziness and lightheadedness. CT scan shows patient has diffuse colitis probably inflammatory in origin. Patient was on oral prednisone. With suspicious for infection the patient was started on antibiotics after discussing with GI. The patient will be continued on Cipro and Flagyl I would also continue prednisone 40 mg. GI has been consulted who will be following up with the patient in the morning to make further recommendation. Currently I will keep him on clear liquid diet. Monitor H&H closely. A transfuse as needed for hemoglobin less than 7. Since the patient does not have any bowel movement for last 2 days other than one today I will hold off on further infectious workup.  2.   Sleep apnea Not on C Pap continue close monitoring.  3  Hypertension Holding blood pressure medication.  Nutrition: Clear liquid diet DVT  Prophylaxis:mechanical compression device  Advance goals of care discussion: Full code   Consults: GI  Family Communication: family was present at bedside, opportunity was given to ask question and all questions were answered satisfactorily at the time of interview. Disposition: Admitted as observation, telemetry unit.  Author: Berle Mull, MD [facility] Pager: [phone] 10/31/2014  If 7PM-7AM, please contact night-coverage [email or web address] Password [email or web address]

## 2014-10-31 NOTE — Progress Notes (Signed)
Brief Pharmacy note:  Ciprofloxacin for intra-abd infection  Cipro 481m IV q12 ordered, concurrent Flagyl 5050mIV q8hr appropriate  No adjustment necessary for antibiotics, Pharmacy will sign off  Thank you,  GrMinda DittoharmD Pager [phone]/10/2014, 10:24 PM

## 2014-11-01 ENCOUNTER — Encounter (HOSPITAL_COMMUNITY): Payer: Self-pay

## 2014-11-01 ENCOUNTER — Observation Stay (HOSPITAL_COMMUNITY): Payer: No Typology Code available for payment source | Admitting: Certified Registered Nurse Anesthetist

## 2014-11-01 ENCOUNTER — Encounter (HOSPITAL_COMMUNITY)
Admission: EM | Disposition: A | Discharge status: Home / Self Care | Payer: Self-pay | Source: Home / Self Care | Attending: Family Medicine

## 2014-11-01 ENCOUNTER — Ambulatory Visit (HOSPITAL_COMMUNITY)
Admission: RE | Admit: 2014-11-01 | Payer: No Typology Code available for payment source | Source: Ambulatory Visit | Admitting: Gastroenterology

## 2014-11-01 DIAGNOSIS — A09 Infectious gastroenteritis and colitis, unspecified: Secondary | ICD-10-CM | POA: Diagnosis not present

## 2014-11-01 DIAGNOSIS — E43 Unspecified severe protein-calorie malnutrition: Secondary | ICD-10-CM | POA: Insufficient documentation

## 2014-11-01 DIAGNOSIS — K529 Noninfective gastroenteritis and colitis, unspecified: Secondary | ICD-10-CM | POA: Diagnosis not present

## 2014-11-01 DIAGNOSIS — Z86718 Personal history of other venous thrombosis and embolism: Secondary | ICD-10-CM | POA: Diagnosis not present

## 2014-11-01 DIAGNOSIS — I10 Essential (primary) hypertension: Secondary | ICD-10-CM | POA: Diagnosis not present

## 2014-11-01 HISTORY — DX: Cardiac arrhythmia, unspecified: I49.9

## 2014-11-01 HISTORY — PX: FLEXIBLE SIGMOIDOSCOPY: SHX5431

## 2014-11-01 HISTORY — DX: Acute embolism and thrombosis of unspecified deep veins of unspecified lower extremity: I82.409

## 2014-11-01 HISTORY — DX: Body Mass Index (BMI) 40.0 and over, adult: Z684

## 2014-11-01 HISTORY — DX: Sleep apnea, unspecified: G47.30

## 2014-11-01 HISTORY — DX: Personal history of urinary calculi: Z87.442

## 2014-11-01 HISTORY — DX: Ulcerative colitis, unspecified, without complications: K51.90

## 2014-11-01 LAB — CBC WITH DIFFERENTIAL/PLATELET
BASOS ABS: 0 10*3/uL (ref 0.0–0.1)
Basophils Relative: 0 %
EOS ABS: 0 10*3/uL (ref 0.0–0.7)
Eosinophils Relative: 0 %
HEMATOCRIT: 36.7 % — AB (ref 39.0–52.0)
Hemoglobin: 12 g/dL — ABNORMAL LOW (ref 13.0–17.0)
LYMPHS ABS: 0.9 10*3/uL (ref 0.7–4.0)
LYMPHS PCT: 13 %
MCH: 32.1 pg (ref 26.0–34.0)
MCHC: 32.7 g/dL (ref 30.0–36.0)
MCV: 98.1 fL (ref 78.0–100.0)
MONOS PCT: 4 %
Monocytes Absolute: 0.3 10*3/uL (ref 0.1–1.0)
Neutro Abs: 6 10*3/uL (ref 1.7–7.7)
Neutrophils Relative %: 83 %
PLATELETS: 295 10*3/uL (ref 150–400)
RBC: 3.74 MIL/uL — AB (ref 4.22–5.81)
RDW: 12.5 % (ref 11.5–15.5)
WBC: 7.2 10*3/uL (ref 4.0–10.5)

## 2014-11-01 LAB — COMPREHENSIVE METABOLIC PANEL
ALBUMIN: 2.5 g/dL — AB (ref 3.5–5.0)
ALT: 34 U/L (ref 17–63)
ANION GAP: 10 (ref 5–15)
AST: 31 U/L (ref 15–41)
Alkaline Phosphatase: 54 U/L (ref 38–126)
BILIRUBIN TOTAL: 0.6 mg/dL (ref 0.3–1.2)
BUN: 31 mg/dL — AB (ref 6–20)
CHLORIDE: 95 mmol/L — AB (ref 101–111)
CO2: 28 mmol/L (ref 22–32)
Calcium: 7.9 mg/dL — ABNORMAL LOW (ref 8.9–10.3)
Creatinine, Ser: 1.03 mg/dL (ref 0.61–1.24)
GFR calc Af Amer: >60 mL/min (ref >60)
GFR calc non Af Amer: >60 mL/min (ref >60)
GLUCOSE: 167 mg/dL — AB (ref 65–99)
POTASSIUM: 4.2 mmol/L (ref 3.5–5.1)
SODIUM: 133 mmol/L — AB (ref 135–145)
Total Protein: 6.1 g/dL — ABNORMAL LOW (ref 6.5–8.1)

## 2014-11-01 LAB — PROTIME-INR
INR: 1.37 (ref 0.00–1.49)
PROTHROMBIN TIME: 17 s — AB (ref 11.6–15.2)

## 2014-11-01 LAB — C DIFFICILE QUICK SCREEN W PCR REFLEX
C DIFFICILE (CDIFF) INTERP: NEGATIVE
C DIFFICILE (CDIFF) TOXIN: NEGATIVE
C DIFFICLE (CDIFF) ANTIGEN: NEGATIVE

## 2014-11-01 SURGERY — SIGMOIDOSCOPY, FLEXIBLE
Anesthesia: Monitor Anesthesia Care

## 2014-11-01 MED ORDER — LACTATED RINGERS IV SOLN
INTRAVENOUS | Status: DC | PRN
  Administered 2014-11-01: 11:00:00 via INTRAVENOUS

## 2014-11-01 MED ORDER — LOSARTAN POTASSIUM 50 MG PO TABS
100.0000 mg | ORAL_TABLET | Freq: Every day | ORAL | Status: DC
  Administered 2014-11-01 – 2014-11-02 (×2): 100 mg via ORAL
  Filled 2014-11-01 (×2): qty 2

## 2014-11-01 MED ORDER — PROPOFOL 10 MG/ML IV BOLUS
INTRAVENOUS | Status: AC
  Filled 2014-11-01: qty 20

## 2014-11-01 MED ORDER — STERILE WATER FOR INJECTION IJ SOLN
INTRAMUSCULAR | Status: AC
  Filled 2014-11-01: qty 20

## 2014-11-01 MED ORDER — LOSARTAN POTASSIUM-HCTZ 100-25 MG PO TABS: 1.0000 | ORAL_TABLET | Freq: Every morning | ORAL | Status: DC

## 2014-11-01 MED ORDER — ONDANSETRON HCL 4 MG/2ML IJ SOLN
INTRAMUSCULAR | Status: DC | PRN
  Administered 2014-11-01: 4 mg via INTRAVENOUS

## 2014-11-01 MED ORDER — BOOST / RESOURCE BREEZE PO LIQD
1.0000 | Freq: Three times a day (TID) | ORAL | Status: DC
  Administered 2014-11-01 – 2014-11-02 (×2): 1 via ORAL

## 2014-11-01 MED ORDER — ONDANSETRON HCL 4 MG/2ML IJ SOLN
INTRAMUSCULAR | Status: AC
  Filled 2014-11-01: qty 2

## 2014-11-01 MED ORDER — HYDROCHLOROTHIAZIDE 25 MG PO TABS
25.0000 mg | ORAL_TABLET | Freq: Every day | ORAL | Status: DC
  Administered 2014-11-01 – 2014-11-02 (×2): 25 mg via ORAL
  Filled 2014-11-01 (×2): qty 1

## 2014-11-01 MED ORDER — PROMETHAZINE HCL 25 MG/ML IJ SOLN: 6.2500 mg | INTRAMUSCULAR | Status: DC | PRN

## 2014-11-01 MED ORDER — MORPHINE SULFATE (PF) 2 MG/ML IV SOLN
2.0000 mg | INTRAVENOUS | Status: DC | PRN
  Administered 2014-11-01 – 2014-11-02 (×5): 2 mg via INTRAVENOUS
  Filled 2014-11-01 (×5): qty 1

## 2014-11-01 MED ORDER — PROPOFOL 500 MG/50ML IV EMUL
INTRAVENOUS | Status: DC | PRN
  Administered 2014-11-01: 300 ug/kg/min via INTRAVENOUS

## 2014-11-01 MED ORDER — EPHEDRINE SULFATE 50 MG/ML IJ SOLN
INTRAMUSCULAR | Status: AC
  Filled 2014-11-01: qty 1

## 2014-11-01 MED ORDER — SODIUM CHLORIDE 0.9 % IJ SOLN
INTRAMUSCULAR | Status: AC
  Filled 2014-11-01: qty 10

## 2014-11-01 SURGICAL SUPPLY — 21 items

## 2014-11-01 NOTE — Op Note (Signed)
Problem: Bloody diarrhea. Chronic ulcerative proctosigmoiditis.  Endoscopist: Earle Gell  Premedication: Propofol administered by anesthesia  Procedure: Diagnostic, unprepped flexible proctosigmoidoscopy The patient was placed in the left lateral decubitus position. Anal inspection and digital rectal exam were normal. The Pentax pediatric colonoscope was introduced into the rectum and advanced to approximately 60 cm from the anal verge at which point solid stool was encountered. The patient did not receive a colon prep for today's exam.  Endoscopic appearance of the rectal and colonic mucosa extending to approximately 55 cm from the anal verge shows severe ulcerative proctosigmoiditis characterized by friable, cobblestoned mucosa with superficial ulceration and pseudomembrane formation. The colonic mucosa appeared normal at approximately 60 cm from the anal verge.  Assessment: Severe left-sided ulcerative colitis  Recommendation:  #1. Screen stool for C. difficile toxin  #2. Resume prednisone 40 mg each morning.  #3. Start low residue diet  #4. Screen for hepatitis B and C. Perform Quantiferon TB gold test. The patient may require a biologic agent in the near future.  5. He can be discharged from the hospital on prednisone 40 mg each morning. He should schedule an appointment with me in approximately 2 weeks.

## 2014-11-01 NOTE — Progress Notes (Signed)
Initial Nutrition Assessment  DOCUMENTATION CODES:   Severe malnutrition in context of acute illness/injury, Morbid obesity  INTERVENTION:  - Will order Boost Breeze TID, each supplement provides 250 kcal and 9 grams of protein - Diet advancement as medically feasible and will order Ensure Enlive with diet advancement - RD will continue to monitor for needs  NUTRITION DIAGNOSIS:   Inadequate oral intake related to acute illness, poor appetite as evidenced by per patient/family report.  GOAL:   Patient will meet greater than or equal to 90% of their needs  MONITOR:   Diet advancement, Supplement acceptance, Weight trends, Labs, I & O's  REASON FOR ASSESSMENT:   Malnutrition Screening Tool  ASSESSMENT:   69 y.o. male with Past medical history of hypertension, sleep apnea not on C Pap, recent treatment for DVT which completed 4 weeks ago, history of ulcerative colitis, BMI more than 40.  Pt seen for MST. BMI indicates morbid obesity. Pt on CLD but states he has not had anything PO this AM; last intake was 2 cans of ginger ale last night. Pt reports that for the past 1 week has has only had jello, Ensure, and grits. If he attempted to eat solid food during that time he would have "a blowout." Pt states these occurences were sometimes mixed with large amounts of blood. He states above listed items he tolerated well in addition to yogurt.  He denies nausea at this time and states no nausea or vomiting PTA. He states that abdominal pain has been intermittent and can be severe at times. He states that he lost 11 lbs in the past 1 week and gained 2 lbs back since admission; likely due to IVF. Eleven lb weight loss indicates 3.5% body weight in 1 week which is significant for time frame.  No muscle or fat wasting noted. Not able to meet needs at this time; pt to go to colonoscopy today. Medications reviewed. Labs reviewed; Na: 133 mmol/L, Cl: 96 mmol/L, BUN elevated, Ca: 7.9 mg/dL.   Diet  Order:  Diet clear liquid Room service appropriate?: Yes; Fluid consistency:: Thin  Skin:  Reviewed, no issues  Last BM:  10/11  Height:   Ht Readings from Last 1 Encounters:  10/31/14 6' 1"  (1.854 m)    Weight:   Wt Readings from Last 1 Encounters:  11/01/14 305 lb 12.5 oz (138.7 kg)    Ideal Body Weight:  83.64 kg (kg)  BMI:  Body mass index is 40.35 kg/(m^2).  Estimated Nutritional Needs:   Kcal:  2080-2280  Protein:  85-95 grams  Fluid:  2-2.3 L/day  EDUCATION NEEDS:   No education needs identified at this time     Jarome Matin, RD, LDN Inpatient Clinical Dietitian Pager # [phone] After hours/weekend pager # [phone]

## 2014-11-01 NOTE — Care Management Note (Signed)
Case Management Note  Patient Details  Name: Steven Blair MRN: [id number] Date of Birth: April 29, 1945  Subjective/Objective: 69 y/o m admitted w/colitis. From home.                   Action/Plan:d/c plan home.   Expected Discharge Date:   (unknown)               Expected Discharge Plan:  Home/Self Care  In-House Referral:     Discharge planning Services  CM Consult  Post Acute Care Choice:    Choice offered to:     DME Arranged:    DME Agency:     HH Arranged:    HH Agency:     Status of Service:  In process, will continue to follow  Medicare Important Message Given:    Date Medicare IM Given:    Medicare IM give by:    Date Additional Medicare IM Given:    Additional Medicare Important Message give by:     If discussed at [place] of Stay Meetings, dates discussed:    Additional Comments:  Dessa Phi, RN 11/01/2014, 3:18 PM

## 2014-11-01 NOTE — Transfer of Care (Signed)
Immediate Anesthesia Transfer of Care Note  Patient: Steven Blair  Procedure(s) Performed: Procedure(s): FLEXIBLE SIGMOIDOSCOPY (N/A)  Patient Location: PACU  Anesthesia Type:MAC  Level of Consciousness:  sedated, patient cooperative and responds to stimulation  Airway & Oxygen Therapy:Patient Spontanous Breathing and Patient connected to face mask oxgen  Post-op Assessment:  Report given to PACU RN and Post -op Vital signs reviewed and stable  Post vital signs:  Reviewed and stable  Last Vitals:  Filed Vitals:   11/01/14 1005  BP: 163/61  Pulse: 75  Temp: 36.9 C  Resp: 20    Complications: No apparent anesthesia complications

## 2014-11-01 NOTE — Progress Notes (Signed)
[facility] PROGRESS NOTE  Steven Blair BWI:[id number] DOB: May 25, 1945 DOA: 10/31/2014 PCP: Henrine Screws, MD  Assessment/Plan: 1. Colitis- patient underwent proctosigmoidoscopy as per GI which showed severe ulcerative proctosigmoiditis characterized by friable cobblestone mucosa with superficial ulceration and pseudomembrane formation. GI recommend screening for C. difficile toxin, continue prednisone, low residue diet, screen for have hepatitis B and C, Quantiferon test for TB test.  I will discontinue ciprofloxacin as no antibiotics recommended per GI, will continue IV Flagyl till C. difficile test is back. If C. difficile PCR is negative discontinue Flagyl. Patient can be discharged on prednisone 40 mg daily. 2. Sleep apnea- patient not on cpap, continue to monitor. 3. Hypertension- blood pressure now elevated, will restart home medications  Code Status: Full code Family Communication: No family at bedside Disposition Plan: Home in next 24 - 48 hrs   Consultants:  GI  Procedures:  Flexible proctosigmoidoscopy  Antibiotics:  Ciprofloxacin  Flagyl  HPI/Subjective: 69 year old male with a history of hypertension, sleep apnea, ulcerative colitis came to the hospital with diarrhea abdominal pain and bright red blood per rectum. Patient was empirically started on Cipro and Flagyl. This morning patient denies any complaints and waiting to get sigmoidoscopy per GI.  Objective: Filed Vitals:   11/01/14 1150  BP: 163/69  Pulse:   Temp:   Resp: 18    Intake/Output Summary (Last 24 hours) at 11/01/14 1319 Last data filed at 11/01/14 1125  Gross per 24 hour  Intake 1531.25 ml  Output      1 ml  Net 1530.25 ml   Filed Weights   10/31/14 2234 11/01/14 0526  Weight: 138.166 kg (304 lb 9.6 oz) 138.7 kg (305 lb 12.5 oz)    Exam:   General:  Appears in no acute distress  Cardiovascular: S1-S2 regular  Respiratory: Clear to auscultation  bilaterally  Abdomen: Soft, mild tenderness in the lower abdomen  Musculoskeletal: No cyanosis/clubbing/edema of the lower extremities   Data Reviewed: Basic Metabolic Panel:  Recent Labs Lab 10/31/14 1450 11/01/14 0607  NA 133* 133*  K 3.5 4.2  CL 96* 95*  CO2 24 28  GLUCOSE 136* 167*  BUN 42* 31*  CREATININE 1.25* 1.03  CALCIUM 8.4* 7.9*   Liver Function Tests:  Recent Labs Lab 10/31/14 1450 11/01/14 0607  AST 36 31  ALT 43 34  ALKPHOS 59 54  BILITOT 1.1 0.6  PROT 6.7 6.1*  ALBUMIN 2.8* 2.5*    Recent Labs Lab 10/31/14 1450  LIPASE 19*   No results for input(s): AMMONIA in the last 168 hours. CBC:  Recent Labs Lab 10/31/14 1450 11/01/14 0607  WBC 9.8 7.2  NEUTROABS  --  6.0  HGB 13.1 12.0*  HCT 39.0 36.7*  MCV 96.1 98.1  PLT 392 295   Studies: Ct Abdomen Pelvis W Contrast  10/31/2014   CLINICAL DATA:  Bright red blood rectal bleeding today with dizziness. Scheduled colonoscopy tomorrow. Generalized abdominal pain.  EXAM: CT ABDOMEN AND PELVIS WITH CONTRAST  TECHNIQUE: Multidetector CT imaging of the abdomen and pelvis was performed using the standard protocol following bolus administration of intravenous contrast.  CONTRAST:  47m OMNIPAQUE IOHEXOL 300 MG/ML SOLN, 1062mOMNIPAQUE IOHEXOL 300 MG/ML SOLN  COMPARISON:  None.  FINDINGS: Lower chest:  No acute findings.  Hepatobiliary: Liver appears normal. Gallbladder is mildly distended. There are small layering stones and/or small amount of sludge within the gallbladder. No pericholecystic inflammation or other signs of acute cholecystitis. No extrahepatic or intrahepatic bile duct dilatation.  Pancreas: No mass, inflammatory changes, or other significant abnormality.  Spleen: Within normal limits in size and appearance.  Adrenals/Urinary Tract: There is a 4 mm nonobstructing right renal stone, midpole region, and 6 mm nonobstructing stone within the lower pole right renal pelvis. There is a simple cyst  within the upper pole of the left kidney. Kidneys otherwise normal. No hydronephrosis bilaterally. No ureteral or bladder calculi identified. Bladder is unremarkable, although partially obscured by metallic artifact from patient's bilateral hip prostheses.  Stomach/Bowel: There is mild thickening of the walls of the proximal transverse colon and upper ascending colon, centered at the hepatic flexure. Suspect mild thickening of the walls of the sigmoid colon as well, although difficult to characterize due to inadequate distention. Stomach and small bowel appears normal throughout. No pericolic fluid collection or inflammatory stranding. No pericolic abscess collection. No dilated large or small bowel loops. No evidence of bowel perforation. Appendix is normal.  Vascular/Lymphatic: There are no enlarged lymph nodes identified. Atherosclerotic changes are seen along the walls of the normal- caliber abdominal aorta.  Reproductive: No mass or other significant abnormality.  Other: No free fluid or abscess collection. No free intraperitoneal air.  Musculoskeletal: No suspicious bone lesions identified. Extensive degenerative changes seen throughout the thoracolumbar spine but no acute osseous abnormality. There is a small periumbilical abdominal wall hernia which contains fat only. As above, there are bilateral hip prostheses which appear well position.  IMPRESSION: 1. At least mild thickening of the walls of the proximal transverse colon and upper descending colon, centered at the hepatic flexure, suggesting acute colitis of an infectious or inflammatory nature. Perhaps mild thickening of the walls of the sigmoid colon as well, but not as convincing. No paracolic fluid collection or abscess. No evidence of bowel perforation (no free intraperitoneal air). No associated bowel obstruction. 2. Cholelithiasis without evidence of acute cholecystitis. 3. Right nephrolithiasis without hydronephrosis. 4. Left renal cyst.    Electronically Signed   By: Franki Cabot M.D.   On: 10/31/2014 19:02    Scheduled Meds: . ciprofloxacin  400 mg Intravenous Q12H  . feeding supplement  1 Container Oral TID BM  . metronidazole  500 mg Intravenous Q8H  . pantoprazole (PROTONIX) IV  40 mg Intravenous Q12H  . predniSONE  40 mg Oral Daily  . sodium chloride  3 mL Intravenous Q12H   Continuous Infusions: . sodium chloride 75 mL/hr at 11/01/14 0000    Principal Problem:   Colitis Active Problems:   Lower GI bleed   Diarrhea, currently resolved   History of DVT of lower extremity   Sleep apnea   Hypertension   BMI 40.0-44.9, adult (HCC)   Protein-calorie malnutrition, severe    Time spent: 25 min    [facility]  [facility] Pager [phone]*. If 7PM-7AM, please contact night-coverage at [email or web address], password [facility] 11/01/2014, 1:19 PM  LOS: 1 day

## 2014-11-01 NOTE — Anesthesia Preprocedure Evaluation (Signed)
Anesthesia Evaluation  Patient identified by MRN, date of birth, ID band Patient awake    Reviewed: Allergy & Precautions, NPO status , Patient's Chart, lab work & pertinent test results  Airway Mallampati: II  TM Distance: >3 FB Neck ROM: Full    Dental no notable dental hx.    Pulmonary sleep apnea ,    Pulmonary exam normal breath sounds clear to auscultation       Cardiovascular hypertension, Pt. on medications + Peripheral Vascular Disease  Normal cardiovascular exam Rhythm:Regular Rate:Normal     Neuro/Psych negative neurological ROS  negative psych ROS   GI/Hepatic negative GI ROS, Neg liver ROS,   Endo/Other  Morbid obesity  Renal/GU negative Renal ROS  negative genitourinary   Musculoskeletal negative musculoskeletal ROS (+)   Abdominal   Peds negative pediatric ROS (+)  Hematology negative hematology ROS (+)   Anesthesia Other Findings   Reproductive/Obstetrics negative OB ROS                             Anesthesia Physical Anesthesia Plan  ASA: III  Anesthesia Plan: MAC   Post-op Pain Management:    Induction: Intravenous  Airway Management Planned: Nasal Cannula  Additional Equipment:   Intra-op Plan:   Post-operative Plan:   Informed Consent: I have reviewed the patients History and Physical, chart, labs and discussed the procedure including the risks, benefits and alternatives for the proposed anesthesia with the patient or authorized representative who has indicated his/her understanding and acceptance.   Dental advisory given  Plan Discussed with: CRNA and Surgeon  Anesthesia Plan Comments:         Anesthesia Quick Evaluation

## 2014-11-01 NOTE — H&P (Signed)
  Problem: Bloody diarrhea on xarelto for DVT and paroxysmal atrial fibrillation. History of ulcerative proctosigmoiditis. 02/04/2011 inactive colitis by colonoscopy to the ascending colon.  History: The patient is a 69 year old male born January 08, 1946. He has a history of chronic ulcerative proctosigmoiditis. He was placed on xarelto following a bout of paroxysmal atrial fibrillation.  For approximately 3 weeks, the patient has had crampy lower abdominal pain associated with bloody diarrhea without fever. His symptoms did not improve on steroid enemas.  In the past 2 weeks the was prescribed amoxicillin.  The patient was admitted to the hospital last night when his hematochezia worsened.  He is scheduled to undergo diagnostic colonoscopy today.  Past medical history: Adenomatous colon polyp removed colonoscopically in the past. 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 Hypercholesterolemia. Hypertension. Eustachian tube dysfunction. Obstructive sleep apnea syndrome. Paroxysmal atrial fibrillation. Bilateral hip replacement surgery. Surgical repair of fractured femur.  Medication allergies: Sulfa drugs  Exam: The patient is alert and lying comfortably on the endoscopy stretcher. Abdomen is soft and nontender to palpation. Lungs are clear to auscultation. Cardiac exam reveals a regular rhythm.  Plan: Proceed with diagnostic colonoscopy

## 2014-11-02 ENCOUNTER — Encounter (HOSPITAL_COMMUNITY): Payer: Self-pay | Admitting: Gastroenterology

## 2014-11-02 DIAGNOSIS — K529 Noninfective gastroenteritis and colitis, unspecified: Secondary | ICD-10-CM | POA: Diagnosis not present

## 2014-11-02 DIAGNOSIS — K922 Gastrointestinal hemorrhage, unspecified: Secondary | ICD-10-CM

## 2014-11-02 DIAGNOSIS — E43 Unspecified severe protein-calorie malnutrition: Secondary | ICD-10-CM

## 2014-11-02 DIAGNOSIS — K51011 Ulcerative (chronic) pancolitis with rectal bleeding: Secondary | ICD-10-CM | POA: Diagnosis not present

## 2014-11-02 LAB — COMPREHENSIVE METABOLIC PANEL
ALK PHOS: 45 U/L (ref 38–126)
ALT: 25 U/L (ref 17–63)
AST: 18 U/L (ref 15–41)
Albumin: 2.1 g/dL — ABNORMAL LOW (ref 3.5–5.0)
Anion gap: 4 — ABNORMAL LOW (ref 5–15)
BUN: 17 mg/dL (ref 6–20)
CALCIUM: 7.5 mg/dL — AB (ref 8.9–10.3)
CHLORIDE: 102 mmol/L (ref 101–111)
CO2: 27 mmol/L (ref 22–32)
CREATININE: 0.99 mg/dL (ref 0.61–1.24)
GFR calc Af Amer: >60 mL/min (ref >60)
Glucose, Bld: 127 mg/dL — ABNORMAL HIGH (ref 65–99)
Potassium: 3.4 mmol/L — ABNORMAL LOW (ref 3.5–5.1)
SODIUM: 133 mmol/L — AB (ref 135–145)
Total Bilirubin: 0.5 mg/dL (ref 0.3–1.2)
Total Protein: 5.3 g/dL — ABNORMAL LOW (ref 6.5–8.1)

## 2014-11-02 LAB — HEPATITIS B SURFACE ANTIBODY,QUALITATIVE: Hep B S Ab: NONREACTIVE

## 2014-11-02 LAB — HEPATITIS B CORE ANTIBODY, IGM: HEP B C IGM: NEGATIVE

## 2014-11-02 LAB — HEPATITIS B CORE ANTIBODY, TOTAL: Hep B Core Total Ab: NEGATIVE

## 2014-11-02 LAB — HEPATITIS C ANTIBODY

## 2014-11-02 MED ORDER — ONDANSETRON HCL 4 MG PO TABS: 4.0000 mg | ORAL_TABLET | Freq: Four times a day (QID) | ORAL | Status: DC | PRN

## 2014-11-02 MED ORDER — PREDNISONE 20 MG PO TABS
40.0000 mg | ORAL_TABLET | Freq: Every day | ORAL | Status: DC
Start: 2014-11-02 — End: 2017-10-18

## 2014-11-02 MED ORDER — OXYCODONE HCL 5 MG PO CAPS: 5.0000 mg | ORAL_CAPSULE | Freq: Four times a day (QID) | ORAL | Status: DC | PRN

## 2014-11-02 MED ORDER — PANTOPRAZOLE SODIUM 40 MG PO TBEC: 40.0000 mg | DELAYED_RELEASE_TABLET | Freq: Every day | ORAL | Status: DC

## 2014-11-02 NOTE — Anesthesia Postprocedure Evaluation (Signed)
  Anesthesia Post-op Note  Patient: Steven Blair  Procedure(s) Performed: Procedure(s) (LRB): FLEXIBLE SIGMOIDOSCOPY (N/A)  Patient Location: PACU  Anesthesia Type: MAC  Level of Consciousness: awake and alert   Airway and Oxygen Therapy: Patient Spontanous Breathing  Post-op Pain: mild  Post-op Assessment: Post-op Vital signs reviewed, Patient's Cardiovascular Status Stable, Respiratory Function Stable, Patent Airway and No signs of Nausea or vomiting  Last Vitals:  Filed Vitals:   11/02/14 0427  BP: 136/83  Pulse: 76  Temp: 36.6 C  Resp: 20    Post-op Vital Signs: stable   Complications: No apparent anesthesia complications

## 2014-11-02 NOTE — Discharge Summary (Signed)
Physician Discharge Summary  Steven Blair BPZ:[id number] DOB: [birth year] DOA: 10/31/2014  PCP: Henrine Screws, MD  Admit date: 10/31/2014 Discharge date: 11/02/2014  Time spent: 35 minutes  Recommendations for Outpatient Follow-up:  1. Please follow up on ulcerative protosigmoiditis, he was discharged on prednisone 40 mg PO q daily 2. He was instructed to follow up with Dr Wynetta Emery of GI in 1-2 weeks  Discharge Diagnoses:  Principal Problem:   Colitis Active Problems:   Lower GI bleed   Diarrhea, currently resolved   History of DVT of lower extremity   Sleep apnea   Hypertension   BMI 40.0-44.9, adult ([place])   Protein-calorie malnutrition, severe   Discharge Condition: Stable  Diet recommendation: Low Residual   Filed Weights   10/31/14 2234 11/01/14 0526 11/02/14 0644  Weight: 138.166 kg (304 lb 9.6 oz) 138.7 kg (305 lb 12.5 oz) 138.71 kg (305 lb 12.8 oz)    History of present illness:  Steven Blair is a 69 y.o. male with Past medical history of hypertension, sleep apnea not on C Pap, recent treatment for DVT which completed 4 weeks ago, history of ulcerative colitis, BMI more than 40. Patient presents with complaints of diarrhea as well as mild right-sided stomach pain and bright red blood per rectum. Patient mentions that he has been having loose watery stool with some blood mixed which is ongoing for last 3 weeks. He also had some right-sided mild crampy abdominal pain. He denies any nausea or vomiting no fever no chills no recent antibiotics use. Patient was on anticoagulation for DVT diagnosed in January and completed the treatment 4 weeks ago. Patient denies being on any antibiotics are being any recent hospitalization. Since last 3 days he has lost his appetite and has not been eating anything significant. He has seen his GI doctor as an outpatient will put him on 40 mg of prednisone. Patient was also scheduled for a colonoscopy tomorrow and was scheduled  for a bowel prep starting tonight. While at home the patient started feeling dizzy and lightheaded and feeling more tired today. This was followed by copious amount of bright red blood per rectum. In this further later to more dizziness and therefore he decided to come to the hospital. At the time of my evaluation patient mentions his pain is minimal denies having any nausea or denies having any chest pain or shortness of breath or dizziness. No further bowel movement reported since his arrival.  Hospital Course:  Patient is a pleasant 69 year old gentleman with a past medical history of ulcerative protosigmoiditis admitted to the medicine service on 10/31/2014 presenting with complaints of bright red blood per rectum associated with crampy lower abdominal pain. Symptoms worsening the evening of 10/31/2014 for which he presented to the emergency department. Initial labs revealed a hemoglobin of 13.1 with hematocrit of 39. Dr. Wynetta Emery of GI was consulted. He underwent flexible proctosigmoidoscopy on 11/01/2014 that showed severe left-sided also ulcerative colitis. GI reported presence of friable cobblestone mucosa with superficial ulceration and pseudomembrane formation. Patient tolerated procedure well there no immediate complications. Stool for C. difficile came back negative. GI recommending resuming prednisone 40 mg by mouth daily. On 11/02/2014 his diet was advanced which he tolerated. He remained stable, afebrile, without further episodes of hematochezia. He was discharged to his home in stable condition on 11/02/2014.  Procedures: Flexible proctosigmoidoscopy performed on 11/01/2014 impression: Severe left-sided ulcerative colitis Endoscopic appearance of the rectal and colonic mucosa extending to approximately 55 cm from the anal verge  shows severe ulcerative proctosigmoiditis characterized by friable, cobblestoned mucosa with superficial ulceration and pseudomembrane formation. The colonic mucosa  appeared normal at approximately 60 cm from the anal verge.  Consultations:  GI  Discharge Exam: Filed Vitals:   11/02/14 1626  BP: 110/50  Pulse:   Temp:   Resp:      General: Appears in no acute distress  Cardiovascular: S1-S2 regular  Respiratory: Clear to auscultation bilaterally  Abdomen: Soft, mild tenderness in the lower abdomen  Musculoskeletal: No cyanosis/clubbing/edema of the lower extremities  Discharge Instructions   Discharge Instructions    Call MD for:  difficulty breathing, headache or visual disturbances    Complete by:  As directed      Call MD for:  extreme fatigue    Complete by:  As directed      Call MD for:  hives    Complete by:  As directed      Call MD for:  persistant dizziness or light-headedness    Complete by:  As directed      Call MD for:  persistant nausea and vomiting    Complete by:  As directed      Call MD for:  redness, tenderness, or signs of infection (pain, swelling, redness, odor or green/yellow discharge around incision site)    Complete by:  As directed      Call MD for:  severe uncontrolled pain    Complete by:  As directed      Call MD for:  temperature >100.4    Complete by:  As directed      Call MD for:    Complete by:  As directed      Diet - low sodium heart healthy    Complete by:  As directed      Increase activity slowly    Complete by:  As directed           Current Discharge Medication List    START taking these medications   Details  ondansetron (ZOFRAN) 4 MG tablet Take 1 tablet (4 mg total) by mouth every 6 (six) hours as needed for nausea. Qty: 20 tablet, Refills: 0    oxycodone (OXY-IR) 5 MG capsule Take 1 capsule (5 mg total) by mouth every 6 (six) hours as needed. Qty: 10 capsule, Refills: 0      CONTINUE these medications which have CHANGED   Details  predniSONE (DELTASONE) 20 MG tablet Take 2 tablets (40 mg total) by mouth daily. Qty: 30 tablet, Refills: 1      CONTINUE these  medications which have NOT CHANGED   Details  losartan-hydrochlorothiazide (HYZAAR) 100-25 MG tablet Take 1 tablet by mouth every morning.    oxymetazoline (AFRIN) 0.05 % nasal spray Place 1 spray into both nostrils daily as needed for congestion.      STOP taking these medications     aspirin 81 MG tablet      COLOCORT 100 MG/60ML enema      GAVILYTE-N WITH FLAVOR PACK 420 G solution      naproxen sodium (ANAPROX) 220 MG tablet        Allergies  Allergen Reactions  . Sulfa Antibiotics Other (See Comments)    Childhood allergy   Follow-up Information    Follow up with Garlan Fair, MD In 1 week.   Specialty:  Gastroenterology   Contact information:   [address] [place] [address] [place] [address] [phone]        The results  of significant diagnostics from this hospitalization (including imaging, microbiology, ancillary and laboratory) are listed below for reference.    Significant Diagnostic Studies: Ct Abdomen Pelvis W Contrast  10/31/2014  CLINICAL DATA:  Bright red blood rectal bleeding today with dizziness. Scheduled colonoscopy tomorrow. Generalized abdominal pain. EXAM: CT ABDOMEN AND PELVIS WITH CONTRAST TECHNIQUE: Multidetector CT imaging of the abdomen and pelvis was performed using the standard protocol following bolus administration of intravenous contrast. CONTRAST:  20m OMNIPAQUE IOHEXOL 300 MG/ML SOLN, 1061mOMNIPAQUE IOHEXOL 300 MG/ML SOLN COMPARISON:  None. FINDINGS: Lower chest:  No acute findings. Hepatobiliary: Liver appears normal. Gallbladder is mildly distended. There are small layering stones and/or small amount of sludge within the gallbladder. No pericholecystic inflammation or other signs of acute cholecystitis. No extrahepatic or intrahepatic bile duct dilatation. Pancreas: No mass, inflammatory changes, or other significant abnormality. Spleen: Within normal limits in size and appearance. Adrenals/Urinary Tract: There is a 4 mm  nonobstructing right renal stone, midpole region, and 6 mm nonobstructing stone within the lower pole right renal pelvis. There is a simple cyst within the upper pole of the left kidney. Kidneys otherwise normal. No hydronephrosis bilaterally. No ureteral or bladder calculi identified. Bladder is unremarkable, although partially obscured by metallic artifact from patient's bilateral hip prostheses. Stomach/Bowel: There is mild thickening of the walls of the proximal transverse colon and upper ascending colon, centered at the hepatic flexure. Suspect mild thickening of the walls of the sigmoid colon as well, although difficult to characterize due to inadequate distention. Stomach and small bowel appears normal throughout. No pericolic fluid collection or inflammatory stranding. No pericolic abscess collection. No dilated large or small bowel loops. No evidence of bowel perforation. Appendix is normal. Vascular/Lymphatic: There are no enlarged lymph nodes identified. Atherosclerotic changes are seen along the walls of the normal- caliber abdominal aorta. Reproductive: No mass or other significant abnormality. Other: No free fluid or abscess collection. No free intraperitoneal air. Musculoskeletal: No suspicious bone lesions identified. Extensive degenerative changes seen throughout the thoracolumbar spine but no acute osseous abnormality. There is a small periumbilical abdominal wall hernia which contains fat only. As above, there are bilateral hip prostheses which appear well position. IMPRESSION: 1. At least mild thickening of the walls of the proximal transverse colon and upper descending colon, centered at the hepatic flexure, suggesting acute colitis of an infectious or inflammatory nature. Perhaps mild thickening of the walls of the sigmoid colon as well, but not as convincing. No paracolic fluid collection or abscess. No evidence of bowel perforation (no free intraperitoneal air). No associated bowel  obstruction. 2. Cholelithiasis without evidence of acute cholecystitis. 3. Right nephrolithiasis without hydronephrosis. 4. Left renal cyst. Electronically Signed   By: StFranki Cabot.D.   On: 10/31/2014 19:02    Microbiology: Recent Results (from the past 240 hour(s))  C difficile quick scan w PCR reflex     Status: None   Collection Time: 11/01/14  3:21 PM  Result Value Ref Range Status   C Diff antigen NEGATIVE NEGATIVE Final   C Diff toxin NEGATIVE NEGATIVE Final   C Diff interpretation Negative for toxigenic C. difficile  Final     Labs: Basic Metabolic Panel:  Recent Labs Lab 10/31/14 1450 11/01/14 0607 11/02/14 0822  NA 133* 133* 133*  K 3.5 4.2 3.4*  CL 96* 95* 102  CO2 24 28 27   GLUCOSE 136* 167* 127*  BUN 42* 31* 17  CREATININE 1.25* 1.03 0.99  CALCIUM 8.4* 7.9* 7.5*  Liver Function Tests:  Recent Labs Lab 10/31/14 1450 11/01/14 0607 11/02/14 0822  AST 36 31 18  ALT 43 34 25  ALKPHOS 59 54 45  BILITOT 1.1 0.6 0.5  PROT 6.7 6.1* 5.3*  ALBUMIN 2.8* 2.5* 2.1*    Recent Labs Lab 10/31/14 1450  LIPASE 19*   No results for input(s): AMMONIA in the last 168 hours. CBC:  Recent Labs Lab 10/31/14 1450 11/01/14 0607  WBC 9.8 7.2  NEUTROABS  --  6.0  HGB 13.1 12.0*  HCT 39.0 36.7*  MCV 96.1 98.1  PLT 392 295   Cardiac Enzymes: No results for input(s): CKTOTAL, CKMB, CKMBINDEX, TROPONINI in the last 168 hours. BNP: BNP (last 3 results) No results for input(s): BNP in the last 8760 hours.  ProBNP (last 3 results) No results for input(s): PROBNP in the last 8760 hours.  CBG: No results for input(s): GLUCAP in the last 168 hours.     SignedKelvin Cellar  [facility] 11/02/2014, 5:32 PM

## 2014-11-02 NOTE — Progress Notes (Signed)
Educated pt on discharge orders. Pt verbalized understanding of information provided by RN. Removed IV from pt. Pt exited hospital via wheelchair. RN answered all questions and concerns from pt.

## 2014-11-03 LAB — QUANTIFERON IN TUBE
QFT TB AG MINUS NIL VALUE: <0 IU/mL
QUANTIFERON MITOGEN VALUE: 0.12 IU/mL
QUANTIFERON NIL VALUE: 0.04 [IU]/mL
QUANTIFERON TB AG VALUE: 0.03 [IU]/mL
QUANTIFERON TB GOLD: UNDETERMINED

## 2014-11-03 LAB — QUANTIFERON TB GOLD ASSAY (BLOOD)

## 2014-11-08 ENCOUNTER — Emergency Department (HOSPITAL_COMMUNITY): Payer: No Typology Code available for payment source

## 2014-11-08 ENCOUNTER — Encounter (HOSPITAL_COMMUNITY): Payer: Self-pay

## 2014-11-08 ENCOUNTER — Emergency Department (HOSPITAL_COMMUNITY)
Admission: EM | Admit: 2014-11-08 | Discharge: 2014-11-08 | Disposition: A | Discharge status: Home / Self Care | Payer: No Typology Code available for payment source | Attending: Emergency Medicine | Admitting: Emergency Medicine

## 2014-11-08 ENCOUNTER — Other Ambulatory Visit: Payer: Self-pay

## 2014-11-08 DIAGNOSIS — R531 Weakness: Secondary | ICD-10-CM | POA: Diagnosis present

## 2014-11-08 DIAGNOSIS — R0602 Shortness of breath: Secondary | ICD-10-CM | POA: Insufficient documentation

## 2014-11-08 DIAGNOSIS — R05 Cough: Secondary | ICD-10-CM | POA: Insufficient documentation

## 2014-11-08 DIAGNOSIS — R42 Dizziness and giddiness: Secondary | ICD-10-CM | POA: Insufficient documentation

## 2014-11-08 DIAGNOSIS — R2243 Localized swelling, mass and lump, lower limb, bilateral: Secondary | ICD-10-CM | POA: Diagnosis not present

## 2014-11-08 DIAGNOSIS — Z8719 Personal history of other diseases of the digestive system: Secondary | ICD-10-CM | POA: Insufficient documentation

## 2014-11-08 DIAGNOSIS — Z7901 Long term (current) use of anticoagulants: Secondary | ICD-10-CM | POA: Insufficient documentation

## 2014-11-08 DIAGNOSIS — R600 Localized edema: Secondary | ICD-10-CM

## 2014-11-08 DIAGNOSIS — Z79899 Other long term (current) drug therapy: Secondary | ICD-10-CM | POA: Insufficient documentation

## 2014-11-08 DIAGNOSIS — Z6841 Body Mass Index (BMI) 40.0 and over, adult: Secondary | ICD-10-CM | POA: Insufficient documentation

## 2014-11-08 DIAGNOSIS — Z86718 Personal history of other venous thrombosis and embolism: Secondary | ICD-10-CM | POA: Insufficient documentation

## 2014-11-08 DIAGNOSIS — Z8669 Personal history of other diseases of the nervous system and sense organs: Secondary | ICD-10-CM | POA: Insufficient documentation

## 2014-11-08 DIAGNOSIS — Z7952 Long term (current) use of systemic steroids: Secondary | ICD-10-CM | POA: Diagnosis not present

## 2014-11-08 DIAGNOSIS — I10 Essential (primary) hypertension: Secondary | ICD-10-CM | POA: Insufficient documentation

## 2014-11-08 DIAGNOSIS — Z87442 Personal history of urinary calculi: Secondary | ICD-10-CM | POA: Diagnosis not present

## 2014-11-08 LAB — CBC
HCT: 32.7 % — ABNORMAL LOW (ref 39.0–52.0)
Hemoglobin: 11.1 g/dL — ABNORMAL LOW (ref 13.0–17.0)
MCH: 32.8 pg (ref 26.0–34.0)
MCHC: 33.9 g/dL (ref 30.0–36.0)
MCV: 96.7 fL (ref 78.0–100.0)
PLATELETS: 321 10*3/uL (ref 150–400)
RBC: 3.38 MIL/uL — ABNORMAL LOW (ref 4.22–5.81)
RDW: 13.1 % (ref 11.5–15.5)
WBC: 4.4 10*3/uL (ref 4.0–10.5)

## 2014-11-08 LAB — URINALYSIS, ROUTINE W REFLEX MICROSCOPIC
BILIRUBIN URINE: NEGATIVE
Glucose, UA: NEGATIVE mg/dL
Hgb urine dipstick: NEGATIVE
KETONES UR: 15 mg/dL — AB
Leukocytes, UA: NEGATIVE
NITRITE: NEGATIVE
PROTEIN: NEGATIVE mg/dL
Specific Gravity, Urine: 1.016 (ref 1.005–1.030)
UROBILINOGEN UA: 0.2 mg/dL (ref 0.0–1.0)
pH: 6.5 (ref 5.0–8.0)

## 2014-11-08 LAB — BASIC METABOLIC PANEL
Anion gap: 13 (ref 5–15)
BUN: 22 mg/dL — AB (ref 6–20)
CO2: 21 mmol/L — ABNORMAL LOW (ref 22–32)
CREATININE: 1.17 mg/dL (ref 0.61–1.24)
Calcium: 7.9 mg/dL — ABNORMAL LOW (ref 8.9–10.3)
Chloride: 100 mmol/L — ABNORMAL LOW (ref 101–111)
Glucose, Bld: 128 mg/dL — ABNORMAL HIGH (ref 65–99)
Potassium: 3.4 mmol/L — ABNORMAL LOW (ref 3.5–5.1)
SODIUM: 134 mmol/L — AB (ref 135–145)

## 2014-11-08 LAB — I-STAT TROPONIN, ED: TROPONIN I, POC: 0.02 ng/mL (ref 0.00–0.08)

## 2014-11-08 LAB — CBG MONITORING, ED: GLUCOSE-CAPILLARY: 134 mg/dL — AB (ref 65–99)

## 2014-11-08 MED ORDER — SODIUM CHLORIDE 0.9 % IV BOLUS (SEPSIS)
1000.0000 mL | Freq: Once | INTRAVENOUS | Status: AC
  Administered 2014-11-08: 1000 mL via INTRAVENOUS

## 2014-11-08 NOTE — ED Notes (Signed)
Pt ambulated to restroom. 

## 2014-11-08 NOTE — Discharge Instructions (Signed)
Please read and follow all provided instructions.  Your diagnoses today include:  1. Bilateral edema of lower extremity   2. Weakness     Tests performed today include:  Blood counts and electrolytes  Troponin test for heart muscle damage-negative  Chest x-ray-no problems  EKG-no problems  Vital signs. See below for your results today.   Medications prescribed:   None  Take any prescribed medications only as directed.  Home care instructions:  Follow any educational materials contained in this packet.  BE VERY CAREFUL not to take multiple medicines containing Tylenol (also called acetaminophen). Doing so can lead to an overdose which can damage your liver and cause liver failure and possibly death.   Follow-up instructions: Please follow-up with your primary care provider in the next 3 days for further evaluation of your symptoms.   Return instructions:   Please return to the Emergency Department if you experience worsening symptoms.   Return if you feel lightheaded or pass out, develop chest pain or worsening shortness of breath.  Please return if you have any other emergent concerns.  Additional Information:  Your vital signs today were: BP 121/64 mmHg   Pulse 65   Temp(Src) 98.5 F (36.9 C) (Oral)   Resp 18   Ht 6' (1.829 m)   Wt 298 lb (135.172 kg)   BMI 40.41 kg/m2   SpO2 100% If your blood pressure (BP) was elevated above 135/85 this visit, please have this repeated by your doctor within one month. --------------

## 2014-11-08 NOTE — ED Notes (Signed)
To room via EMS from [facility].  Pt reports admitted to hospital 1 week ago for lower GI bleeding and abdominal pain, sigmoidoscopy done on 11-01-14, showed left sided ulcerative colitis.  Bleeding has tapered off.  Last BM this morning, loose, trace of blood noted.   Onset several days edema in bilateral ankles and weakness and lightheaded when standing.

## 2014-11-08 NOTE — ED Notes (Signed)
Patient transported to X-ray 

## 2014-11-08 NOTE — ED Notes (Signed)
Pt ambulated with pulse ox at 100%, stats did not drop while walking.

## 2014-11-08 NOTE — ED Provider Notes (Signed)
CSN: [id number]     Arrival date & time 11/08/14  1137 History   First MD Initiated Contact with Patient 11/08/14 1138     Chief Complaint  Patient presents with  . Weakness  . 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 Patient went to his PCP today for a recheck and was found to have orthostasis. He was referred to the emergency department for further evaluation. Patient denies signs of stroke including: facial droop, slurred speech, aphasia, weakness/numbness in extremities, imbalance/trouble walking. He has no chest pain but does report mild shortness of breath with exertion. Also mild occasional cough. This does not cause him to stop activities but is more noticeable than usual. No history of CAD, MI, congestive heart failure. Patient has a history of hypertension which was improved with exercise. He has never smoked. No history of diabetes or high cholesterol. No family history of heart attack. With his previous DVT, patient had tender unilateral lateral leg swelling. Current swelling is different in that it is bilateral, nonpainful. The onset of this condition was acute. The course is constant. Alleviating factors: none.    The history is provided by the patient and medical records.    Past Medical History  Diagnosis Date  . Hypertension   . Sleep apnea     no cpap used, couldn't tolerate  . DVT of leg (deep venous thrombosis) (HCC)     left leg tx. Xarelto - no longer taking  . Dysrhythmia     Atrial Fib x1 episode, cardioversion 4'16- NSR  . History of kidney stones     x1 -20 yrs ago  . Ulcerative  colitis ([place])     history of , some current issues at this time  . BMI 40.0-44.9, adult ([place])     Greater than 40 BMI   Past Surgical History  Procedure Laterality Date  . Joint replacement      BTHA  . Femur surgery Left     ORIF 10'05  . Cardioversion      tx Paroxysmal Atrial Fibrillation- converted to NSR  . Colonoscopy w/ polypectomy    . Flexible sigmoidoscopy N/A 11/01/2014    Procedure: FLEXIBLE SIGMOIDOSCOPY;  Surgeon: Garlan Fair, MD;  Location: [facility] ENDOSCOPY;  Service: Endoscopy;  Laterality: N/A;   History reviewed. No pertinent family history. Social History  Substance Use Topics  . Smoking status: Never Smoker   . Smokeless tobacco: None  . Alcohol Use: Yes     Comment: occ. social    Review of Systems  Constitutional: Negative for fever.  HENT: Negative for rhinorrhea and sore throat.   Eyes: Negative for redness.  Respiratory: Positive for shortness of breath. Negative for cough.   Cardiovascular: Positive for leg swelling. Negative for chest pain.  Gastrointestinal: Negative for nausea, vomiting, abdominal pain and diarrhea.  Genitourinary: Negative for dysuria.  Musculoskeletal: Negative for myalgias.  Skin: Negative for rash.  Neurological: Positive for weakness. Negative for headaches.      Allergies  Sulfa antibiotics  Home Medications   Prior to Admission medications   Medication Sig Start Date End Date Taking? Authorizing Provider  losartan-hydrochlorothiazide (HYZAAR) 100-25 MG tablet Take 1 tablet by mouth every morning.  Historical Provider, MD  ondansetron (ZOFRAN) 4 MG tablet Take 1 tablet (4 mg total) by mouth every 6 (six) hours as needed for nausea. 11/02/14   Kelvin Cellar, MD  oxycodone (OXY-IR) 5 MG capsule Take 1 capsule (5 mg total) by mouth every 6 (six) hours as needed. 11/02/14   Kelvin Cellar, MD  oxymetazoline (AFRIN) 0.05 % nasal spray Place 1 spray into both nostrils daily as needed for congestion.    Historical  Provider, MD  predniSONE (DELTASONE) 20 MG tablet Take 2 tablets (40 mg total) by mouth daily. 11/02/14   Kelvin Cellar, MD   BP 128/61 mmHg  Pulse 98  Temp(Src) 98.5 F (36.9 C) (Oral)  Resp 20  Ht 6' (1.829 m)  Wt 298 lb (135.172 kg)  BMI 40.41 kg/m2  SpO2 99%   Physical Exam  Constitutional: He appears well-developed and well-nourished.  HENT:  Head: Normocephalic and atraumatic.  Mouth/Throat: Oropharynx is clear and moist and mucous membranes are normal. Mucous membranes are not dry.  Eyes: Conjunctivae are normal. Right eye exhibits no discharge. Left eye exhibits no discharge.  Neck: Trachea normal and normal range of motion. Neck supple. Normal carotid pulses and no JVD present. No muscular tenderness present. Carotid bruit is not present. No tracheal deviation present.  Cardiovascular: Normal rate, regular rhythm, S1 normal, S2 normal, normal heart sounds and intact distal pulses.  Exam reveals no distant heart sounds and no decreased pulses.   No murmur heard. Pulmonary/Chest: Effort normal and breath sounds normal. No respiratory distress. He has no wheezes. He exhibits no tenderness.  Abdominal: Soft. Normal aorta and bowel sounds are normal. There is no tenderness. There is no rebound and no guarding.  Musculoskeletal: He exhibits edema. He exhibits no tenderness.  1+ pitting edema to mid-ankles bilaterally.   Neurological: He is alert.  Skin: Skin is warm and dry. He is not diaphoretic. No cyanosis. No pallor.  Psychiatric: He has a normal mood and affect.  Nursing note and vitals reviewed.   ED Course  Procedures (including critical care time) Labs Review Labs Reviewed  BASIC METABOLIC PANEL - Abnormal; Notable for the following:    Sodium 134 (*)    Potassium 3.4 (*)    Chloride 100 (*)    CO2 21 (*)    Glucose, Bld 128 (*)    BUN 22 (*)    Calcium 7.9 (*)    All other components within normal limits  CBC - Abnormal; Notable for the following:    RBC  3.38 (*)    Hemoglobin 11.1 (*)    HCT 32.7 (*)    All other components within normal limits  URINALYSIS, ROUTINE W REFLEX MICROSCOPIC (NOT AT [facility]) - Abnormal; Notable for the following:    Ketones, ur 15 (*)    All other components within normal limits  CBG MONITORING, ED - Abnormal; Notable for the following:    Glucose-Capillary 134 (*)    All other components within normal limits  I-STAT TROPOININ, ED    Imaging Review Dg Chest 2 View  11/08/2014  CLINICAL DATA:  Shortness of breath with exertion. EXAM: CHEST  2 VIEW COMPARISON:  11/06/2003 FINDINGS: Stable cardiac size, with no suspected cardiomegaly when accounting for apical fat pad. Stable aortic and hilar contours. There is no edema, consolidation, effusion, or pneumothorax. Bridging thoracic osteophytes. IMPRESSION: No evidence of acute cardiopulmonary disease. Electronically Signed   By: Monte Fantasia M.D.   On: 11/08/2014 13:39   I have personally  reviewed and evaluated these images and lab results as part of my medical decision-making.   EKG Interpretation   Date/Time:  Tuesday November 08 2014 11:44:20 EDT Ventricular Rate:  73 PR Interval:  92 QRS Duration: 104 QT Interval:  425 QTC Calculation: 468 R Axis:   -39 Text Interpretation:  Sinus rhythm Short PR interval Incomplete RBBB and  LAFB Low voltage, precordial leads RSR' in V1 or V2, right VCD or RVH No  significant change since last tracing Confirmed by FLOYD MD, DANIEL  ([id number]) on 11/08/2014 12:28:07 PM       12:29 PM Patient seen and examined. Work-up initiated. Discussed with Dr. Tyrone Nine who will see.   Vital signs reviewed and are as follows: BP 128/61 mmHg  Pulse 98  Temp(Src) 98.5 F (36.9 C) (Oral)  Resp 20  Ht 6' (1.829 m)  Wt 298 lb (135.172 kg)  BMI 40.41 kg/m2  SpO2 99%  3:34 PM workup reassuring. Patient has ambulated well without difficulty or desaturation. Discussed findings with patient and wife at bedside. Will discharge to home  with PCP follow-up. Patient encouraged to return with worsening symptoms, shortness of breath, chest pain, fever, new symptoms or other concerns. He verbalizes understanding and agrees with plan. Patient encouraged to follow-up with PCP this week.  MDM   Final diagnoses:  Bilateral edema of lower extremity  Weakness   Generalized weakness: Workup is reassuring. No significant anemia. No UTI. White blood cell count is normal. Electrolytes are stable. No pneumonia on chest x-ray or other suggestive symptoms of infection on exam.  Bilateral lower extremity edema: Do not suspect CHF given normal chest x-ray findings, nephrotic syndrome given no pertinent in urine, liver failure given past medical history. Swelling is bilateral and is not suggestive of DVT today. No pain or redness to suggest cellulitis. Do not feel that Doppler ultrasound is indicated at this time. Patient follow-up with PCP.  No dangerous or life-threatening conditions suspected or identified by history, physical exam, and by work-up. No indications for hospitalization identified.      Carlisle Cater, PA-C 11/08/14 [place], DO 11/08/14 1729

## 2017-10-18 ENCOUNTER — Emergency Department (HOSPITAL_COMMUNITY): Payer: No Typology Code available for payment source

## 2017-10-18 ENCOUNTER — Observation Stay (HOSPITAL_COMMUNITY)
Admission: EM | Admit: 2017-10-18 | Discharge: 2017-10-20 | Disposition: A | Discharge status: Skilled Nursing Facility | Payer: No Typology Code available for payment source | Attending: Internal Medicine | Admitting: Internal Medicine

## 2017-10-18 ENCOUNTER — Other Ambulatory Visit: Payer: Self-pay

## 2017-10-18 DIAGNOSIS — M25559 Pain in unspecified hip: Secondary | ICD-10-CM | POA: Diagnosis present

## 2017-10-18 DIAGNOSIS — Z6841 Body Mass Index (BMI) 40.0 and over, adult: Secondary | ICD-10-CM

## 2017-10-18 DIAGNOSIS — R262 Difficulty in walking, not elsewhere classified: Secondary | ICD-10-CM

## 2017-10-18 DIAGNOSIS — G4733 Obstructive sleep apnea (adult) (pediatric): Secondary | ICD-10-CM | POA: Diagnosis not present

## 2017-10-18 DIAGNOSIS — Z86718 Personal history of other venous thrombosis and embolism: Secondary | ICD-10-CM | POA: Diagnosis not present

## 2017-10-18 DIAGNOSIS — Z87442 Personal history of urinary calculi: Secondary | ICD-10-CM | POA: Diagnosis not present

## 2017-10-18 DIAGNOSIS — Z96643 Presence of artificial hip joint, bilateral: Secondary | ICD-10-CM | POA: Diagnosis not present

## 2017-10-18 DIAGNOSIS — I10 Essential (primary) hypertension: Secondary | ICD-10-CM | POA: Diagnosis not present

## 2017-10-18 DIAGNOSIS — E669 Obesity, unspecified: Secondary | ICD-10-CM | POA: Insufficient documentation

## 2017-10-18 DIAGNOSIS — S73102A Unspecified sprain of left hip, initial encounter: Secondary | ICD-10-CM | POA: Diagnosis not present

## 2017-10-18 DIAGNOSIS — Z8679 Personal history of other diseases of the circulatory system: Secondary | ICD-10-CM | POA: Insufficient documentation

## 2017-10-18 DIAGNOSIS — Z882 Allergy status to sulfonamides status: Secondary | ICD-10-CM | POA: Insufficient documentation

## 2017-10-18 DIAGNOSIS — Z6835 Body mass index (BMI) 35.0-35.9, adult: Secondary | ICD-10-CM | POA: Diagnosis not present

## 2017-10-18 DIAGNOSIS — M25552 Pain in left hip: Secondary | ICD-10-CM | POA: Diagnosis present

## 2017-10-18 DIAGNOSIS — G4730 Sleep apnea, unspecified: Secondary | ICD-10-CM | POA: Diagnosis present

## 2017-10-18 DIAGNOSIS — M8580 Other specified disorders of bone density and structure, unspecified site: Secondary | ICD-10-CM | POA: Diagnosis not present

## 2017-10-18 DIAGNOSIS — K5190 Ulcerative colitis, unspecified, without complications: Secondary | ICD-10-CM | POA: Insufficient documentation

## 2017-10-18 DIAGNOSIS — W010XXA Fall on same level from slipping, tripping and stumbling without subsequent striking against object, initial encounter: Secondary | ICD-10-CM | POA: Diagnosis not present

## 2017-10-18 DIAGNOSIS — Z79899 Other long term (current) drug therapy: Secondary | ICD-10-CM | POA: Diagnosis not present

## 2017-10-18 DIAGNOSIS — K529 Noninfective gastroenteritis and colitis, unspecified: Secondary | ICD-10-CM | POA: Diagnosis present

## 2017-10-18 LAB — BASIC METABOLIC PANEL
Anion gap: 11 (ref 5–15)
BUN: 29 mg/dL — ABNORMAL HIGH (ref 8–23)
CHLORIDE: 105 mmol/L (ref 98–111)
CO2: 24 mmol/L (ref 22–32)
CREATININE: 1.11 mg/dL (ref 0.61–1.24)
Calcium: 9.1 mg/dL (ref 8.9–10.3)
GFR calc Af Amer: >60 mL/min (ref >60)
GFR calc non Af Amer: >60 mL/min (ref >60)
GLUCOSE: 101 mg/dL — AB (ref 70–99)
POTASSIUM: 4.1 mmol/L (ref 3.5–5.1)
Sodium: 140 mmol/L (ref 135–145)

## 2017-10-18 LAB — CBC
HCT: 45.1 % (ref 39.0–52.0)
HEMOGLOBIN: 15.2 g/dL (ref 13.0–17.0)
MCH: 34.7 pg — AB (ref 26.0–34.0)
MCHC: 33.7 g/dL (ref 30.0–36.0)
MCV: 103 fL — AB (ref 78.0–100.0)
Platelets: 178 10*3/uL (ref 150–400)
RBC: 4.38 MIL/uL (ref 4.22–5.81)
RDW: 12.6 % (ref 11.5–15.5)
WBC: 9.1 10*3/uL (ref 4.0–10.5)

## 2017-10-18 MED ORDER — IBUPROFEN 400 MG PO TABS
600.0000 mg | ORAL_TABLET | Freq: Four times a day (QID) | ORAL | Status: DC
  Administered 2017-10-18 – 2017-10-20 (×6): 600 mg via ORAL
  Filled 2017-10-18: qty 3
  Filled 2017-10-18: qty 1
  Filled 2017-10-18: qty 3
  Filled 2017-10-18 (×3): qty 1

## 2017-10-18 MED ORDER — KETOROLAC TROMETHAMINE 60 MG/2ML IM SOLN
60.0000 mg | Freq: Once | INTRAMUSCULAR | Status: AC
  Administered 2017-10-18: 60 mg via INTRAMUSCULAR
  Filled 2017-10-18: qty 2

## 2017-10-18 MED ORDER — HYDROCODONE-ACETAMINOPHEN 5-325 MG PO TABS
1.0000 | ORAL_TABLET | Freq: Once | ORAL | Status: DC
Start: 2017-10-18 — End: 2017-10-18

## 2017-10-18 MED ORDER — ACETAMINOPHEN 325 MG PO TABS
650.0000 mg | ORAL_TABLET | Freq: Four times a day (QID) | ORAL | Status: DC | PRN
  Administered 2017-10-19: 650 mg via ORAL
  Filled 2017-10-18: qty 2

## 2017-10-18 MED ORDER — DIAZEPAM 5 MG PO TABS
5.0000 mg | ORAL_TABLET | Freq: Once | ORAL | Status: AC
  Administered 2017-10-18: 5 mg via ORAL
  Filled 2017-10-18: qty 1

## 2017-10-18 NOTE — ED Notes (Signed)
Bed: OZ29 Expected date:  Expected time:  Means of arrival:  Comments: RM 9

## 2017-10-18 NOTE — ED Notes (Signed)
Patient transported to MRI 

## 2017-10-18 NOTE — ED Notes (Signed)
Patient transported to X-ray 

## 2017-10-18 NOTE — Progress Notes (Addendum)
[facility] ED CSW covering [facility] reviewed chart and noted consult for information on [facility] versus SNF placement.  CSW spoke to EDP who confirms pt has complaints of left hip pain after a fall onto his left side and pt states he has a HX of left hip replacement. Review of the chart reveals a visit to a PA on 8/26 for the same reason.  Per ED RN notes"Patient arrives with c/o fall. Patient had a fall from standing, fell on left side. Hx left hip replacement with rod. Patient reports ability to walk on left leg after. Pain worse with movement. Denies hitting head".  EDP stated pt cannot walk and EDP states pt "is not comfortable with going home".  Per EDP, "Workup thus far shows no fractures / ligament damage on the MRI and Xray".  CSW attempted to call the pt's cell phone at ph: [phone] and reached the pt's voicemail and left a HIPPA-compliant VM asking for a return call to the [place] at ph: [phone].   CSW reviewed chart and per chart pt's insurance is "[place] [employer]".  CSW received a call back from the pt who provided the CSW with verbal permission to have [employer] fax the CSW the pt's beneifts and CSW received the FAX.  CSW asked if the pt lived alone and the pt stated, "No, actually I live with my wife, and she is at a wedding right now where I was supposed to be dancing with her tonight", but pt stated instead the pt came to the ED.  Pt stated his wife would be back home at the pt's house after the wedding tonight.  CSW went online and  called Provider Services for [employer] at [phone] and had a copy of the pt's explanation of services faxed to the [place].   Per EDP, pt is unable to walk and if pt is unable to walk and unable to perform PT then the pt would be considered custodial by the insurance company and per the pt's benefits packet faxed to the [place] under the section entitled, "[place]:  "[place] is NOT covered.  Benefits are limited to a 21-day  maximum following a transfer from an acute inpatient confinement.  Benefits are payable at 100% NO DEDUCTIBLE".  CSW called [facility] the SNF/[facility] # on the pt's [employer] for insurance authorizations on the contact list at:  Ph: [phone] and office is closed until Monday morning.  7:36 PM EDP who stated pt will remain in the ED overnight to see if pt improves and is able to ambulate tomorrow to be safe for D/C.   EDP is aware that pt's ins auth cannot begin until Mon 9/30 to see if pt will receive insurance days.  Per the pt's chart and from talking to the EDP, [place] notes that it is not likely since pt has no acute, rehab-able injuries or conditions that insurance companies usually authorize days for and pt's insurance DOES NOT pay for custodial days, pt is not likely to receive authorized SNF days.  In addition, pt's benefits state that pt can only be transferred to a SNF if authorized from "acute inpatient confinement".  EDP updated.  2nd shift ED CSW will leave handoff for 1st shift ED CSW.  CSW will leave a copy of the pt's benefits on the [facility] ED CSW's desk in the main office and the [facility] ED CSW can  Request that the [facility] ED CSW on 9/29 fax these to the [facility] ED CSW, if needed.  CSW will continue to follow for D/C needs.  Alphonse Guild. Shaden Lacher, LCSW, LCAS, CSI Clinical Social Worker Ph: [phone]

## 2017-10-18 NOTE — ED Notes (Signed)
Pt resting watching t.v., awaiting placement at this time. I will continue to monitor.

## 2017-10-18 NOTE — ED Provider Notes (Addendum)
  Physical Exam  BP (!) 158/71 (BP Location: Right Arm)   Pulse 73   Resp 16   Ht 6' (1.829 m)   Wt 124.3 kg   SpO2 95%   BMI 37.16 kg/m   Physical Exam  ED Course/Procedures     Procedures  MDM    Assuming care of patient from Dr. Ronnald Nian.   Patient in the ED for pain in the L hip. Pt had a fall at 7 am, and resultant severe pain. Workup thus far shows no fractures / ligament damage on the MRI and Xray.  Concerning findings are as following: none/ Important pending results are CT hip.  According to Dr. Ronnald Nian, plan is to f/u on CT. We will reassess post CT scan.  Pt is unable to walk and he is not comfortable going home. Will consult Ortho if needed.  Patient had no complaints currently, no concerns from the nursing side. Will continue to monitor.    Varney Biles, MD 10/18/17 1624  8:08 PM Results reviewed with Dr. Lyla Glassing, orthopedic.  He has independently reviewed the CT scan, MRI and the x-ray.  He recommends getting a lumbar spine x-ray just to be sure that there is no lumbar spine fracture.  Otherwise there is no additional help from orthopedic service.  Meanwhile we consulted social work service.  They have reviewed patient's health insurance, and reported that if patient needs placement, the patient will need admission prior to being sent to an assisted living.  Our plan is for patient to stay in the ED and get anti-inflammatory for now.  He will be assessed by PT OT in the morning.  If they recommend that patient needs to be placed at an assisted living, and patient also wants the same then he will need admission unfortunately to get placed.    Varney Biles, MD 10/18/17 2009

## 2017-10-18 NOTE — ED Notes (Signed)
Patient transported to CT 

## 2017-10-18 NOTE — ED Triage Notes (Signed)
Patient arrives with c/o fall. Patient had a fall from standing, fell on left side. Hx left hip replacement with rod. Patient reports ability to walk on left leg after. Pain worse with movement. Denies hitting head.

## 2017-10-18 NOTE — ED Notes (Signed)
Bed: WA29 Expected date:  Expected time:  Means of arrival:  Comments:

## 2017-10-18 NOTE — ED Provider Notes (Signed)
[facility] DEPT Provider Note   CSN: [id number] Arrival date & time: 10/18/17  1116     History   Chief Complaint Chief Complaint  Patient presents with  . Leg Pain  . Fall    HPI Steven Blair is a 72 y.o. male.  The history is provided by the patient.  Leg Pain   This is a new problem. The current episode started 3 to 5 hours ago. The problem occurs constantly. The problem has been gradually worsening. The pain is present in the left hip and left upper leg. The quality of the pain is described as aching. The pain is at a severity of 4/10. The pain is moderate. Pertinent negatives include no numbness, full range of motion, no stiffness, no tingling and no itching. The symptoms are aggravated by activity. He has tried nothing for the symptoms. The treatment provided no relief. There has been a history of trauma (tripped over something and landed on left hip area).    Past Medical History:  Diagnosis Date  . BMI 40.0-44.9, adult (HCC)    Greater than 40 BMI  . 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 Xarelto - no longer taking  . Dysrhythmia    Atrial Fib x1 episode, cardioversion 4'16- NSR  . History of kidney stones    x1 -20 yrs ago  . Hypertension   . Sleep apnea    no cpap used, couldn't tolerate  . Ulcerative colitis ([place])    history of , some current issues at this time    Patient Active Problem List   Diagnosis Date Noted  . Protein-calorie malnutrition, severe 11/01/2014  . Lower GI bleed 10/31/2014  . Colitis 10/31/2014  . Diarrhea, currently resolved 10/31/2014  . History of DVT of lower extremity 10/31/2014  . Sleep apnea   . Hypertension   . BMI 40.0-44.9, adult [facility])     Past Surgical History:  Procedure Laterality Date  . CARDIOVERSION     tx Paroxysmal Atrial Fibrillation- converted to NSR  . COLONOSCOPY W/ POLYPECTOMY    . FEMUR SURGERY Left    ORIF 10'05  . FLEXIBLE SIGMOIDOSCOPY N/A  11/01/2014   Procedure: FLEXIBLE SIGMOIDOSCOPY;  Surgeon: Garlan Fair, MD;  Location: [facility] ENDOSCOPY;  Service: Endoscopy;  Laterality: N/A;  . JOINT REPLACEMENT     BTHA        Home Medications    Prior to Admission medications   Medication Sig Start Date End Date Taking? Authorizing Provider  losartan-hydrochlorothiazide (HYZAAR) 50-12.5 MG tablet Take 1 tablet by mouth daily.   Yes [provider]  Mesalamine 800 MG TBEC Take 800 mg by mouth daily. 10/17/17  Yes [provider]    Family History No family history on file.  Social History Social History   Tobacco Use  . Smoking status: Never Smoker  Substance Use Topics  . Alcohol use: Yes    Comment: occ. social  . Drug use: No     Allergies   Sulfa antibiotics   Review of Systems Review of Systems  Constitutional: Negative for chills and fever.  HENT: Negative for ear pain and sore throat.   Eyes: Negative for pain and visual disturbance.  Respiratory: Negative for cough and shortness of breath.   Cardiovascular: Negative for chest pain and palpitations.  Gastrointestinal: Negative for abdominal pain and vomiting.  Genitourinary: Negative for dysuria and hematuria.  Musculoskeletal: Positive for gait problem. Negative for  arthralgias, back pain and stiffness.  Skin: Negative for color change, itching and rash.  Neurological: Negative for tingling, seizures, syncope and numbness.  All other systems reviewed and are negative.    Physical Exam Updated Vital Signs  ED Triage Vitals [10/18/17 1125]  Enc Vitals Group     BP 140/61     Pulse Rate 84     Resp 14     Temp      Temp src      SpO2 98 %     Weight 274 lb (124.3 kg)     Height 6' (1.829 m)     Head Circumference      Peak Flow      Pain Score 0     Pain Loc      Pain Edu?      Excl. in [place]?     Physical Exam  Constitutional: He is oriented to person, place, and time. He appears well-developed and well-nourished.    HENT:  Head: Normocephalic and atraumatic.  Eyes: Pupils are equal, round, and reactive to light. Conjunctivae and EOM are normal.  Neck: Normal range of motion. Neck supple.  Cardiovascular: Normal rate, regular rhythm, normal heart sounds and intact distal pulses.  No murmur heard. Pulmonary/Chest: Effort normal and breath sounds normal. No respiratory distress.  Abdominal: Soft. There is no tenderness.  Musculoskeletal: He exhibits tenderness (TTP in left hip/pelvis). He exhibits no edema or deformity.  Pain with ROM of LLE  Neurological: He is alert and oriented to person, place, and time. No cranial nerve deficit or sensory deficit. He exhibits normal muscle tone. Coordination normal.  5+/5 strength and normal sensation in LLE  Skin: Skin is warm and dry.  Psychiatric: He has a normal mood and affect.  Nursing note and vitals reviewed.    ED Treatments / Results  Labs (all labs ordered are listed, but only abnormal results are displayed) Labs Reviewed - No data to display  EKG None  Radiology Mr Hip Left Wo Contrast  Result Date: 10/18/2017 CLINICAL DATA:  Left hip pain status post fall. EXAM: MR OF THE LEFT HIP WITHOUT CONTRAST TECHNIQUE: Multiplanar, multisequence MR imaging was performed. No intravenous contrast was administered. COMPARISON:  None. FINDINGS: Bones/Joint/Cartilage Bilateral total hip arthroplasties with susceptibility artifact partially obscuring the adjacent soft tissue and osseous structures. No periarticular fluid collection. No osteolysis. No acute fracture or dislocation. Normal alignment. No joint effusion. Degenerative disc disease with disc height loss throughout the lumbar spine. Broad-based disc bulge at L4-5. Moderate bilateral facet arthropathy at L4-5. Moderate spinal stenosis at L4-5 with bilateral lateral recess stenosis and foraminal stenosis. Moderate right and mild left facet arthropathy at L5-S1. Moderate bilateral facet arthropathy at L3-4  and a broad-based disc osteophyte complex with moderate spinal stenosis. Sacrum and sacroiliac joints are intact without widening or erosive changes. No sacral fracture. Ligaments, Muscles and Tendons Generalized atrophy of bilateral gluteal musculature. Muscles are otherwise normal in signal. No muscle edema. No intramuscular fluid collection. Soft tissue No fluid collection or hematoma.  No soft tissue mass. IMPRESSION: 1. No acute injury of the left hip. 2. No soft tissue injury around the left hip. 3. Lumbar spine spondylosis partially visualized. Electronically Signed   By: Kathreen Devoid   On: 10/18/2017 15:27   Dg Hip Unilat With Pelvis 2-3 Views Left  Result Date: 10/18/2017 CLINICAL DATA:  Status post fall.  Left-sided pain EXAM: DG HIP (WITH OR WITHOUT PELVIS) 2-3V LEFT COMPARISON:  None.  FINDINGS: Generalized osteopenia. No acute fracture or dislocation. Bilateral total hip arthroplasties. Left lateral sideplate and multiple interlocking screws and cerclage wires without failure or complication. Distal aspect of the left femoral distal sideplate is not included in the field of view. Mild degenerative changes of bilateral sacroiliac joints and lower lumbar spine. IMPRESSION: No acute osseous injury of the left hip. Electronically Signed   By: Kathreen Devoid   On: 10/18/2017 12:58   Dg Femur Min 2 Views Left  Result Date: 10/18/2017 CLINICAL DATA:  Pain status post fall EXAM: LEFT FEMUR 2 VIEWS COMPARISON:  None. FINDINGS: Generalized osteopenia. Left total hip arthroplasty without failure complication. Lateral femoral side plate and multiple interlocking screws and cerclage wires transfixing a healed fracture. No aggressive osseous lesion. Mild medial femorotibial compartment joint space narrowing. IMPRESSION: No acute osseous injury of the left femur. Electronically Signed   By: Kathreen Devoid   On: 10/18/2017 13:01    Procedures Procedures (including critical care time)  Medications Ordered in  ED Medications  ketorolac (TORADOL) injection 60 mg (has no administration in time range)  diazepam (VALIUM) tablet 5 mg (has no administration in time range)     Initial Impression / Assessment and Plan / ED Course  I have reviewed the triage vital signs and the nursing notes.  Pertinent labs & imaging results that were available during my care of the patient were reviewed by me and considered in my medical decision making (see chart for details).     Steven Blair is a 72 year old male history of hypertension who presents to the ED with left hip pain after mechanical fall.  Patient with normal vitals.  No fever.  Patient tripped and fell and landed on his left hip earlier this morning.  He was able to ambulate with a walker shortly after the fall but he had increased pain and instability in the left hip.  Patient is neurovascularly and neuromuscularly intact on exam.  He has pain in his left hip when I attempted hip flexion.  There is no obvious deformity or internal rotation.  Patient had hip replacement surgery 16 years ago and had left femur rod placed 13 years ago.  He is not on any blood thinners.  He typically ambulates with a walker at times.  X-rays of the left hip and left femur show no acute fractures or malalignment.  However given concern for ongoing occult injury contacted radiology and we will try MRI of the left hip to try to further evaluate for any ligamentous injury or other occult bony injury.  Patient does have hardware which will cause imaging to be less than ideal but believe this would be preferred to CT.   Patient with MRI that did not show any acute findings.  Ligaments and bony structures are intact.  Had a discussion with Dr. Posey Pronto with radiology again on the phone and we will also obtain a CT scan of the left hip to give further evaluation of the bony structures.  Patient given p.o. Valium, IM Toradol to treat any muscular strain source.  However, he is still unable to  ambulate or bear any weight on his left leg.  Patient was signed out to Dr. Kathrynn Blair with patient pending CT scan.  Likely will touch base with orthopedics as maybe there is an occult hardware issue given the patient feeling instability in the joint.  Please see Dr. Andree Elk note for further results, evaluation, disposition of the patient.  This chart was dictated  using voice recognition software.  Despite best efforts to proofread,  errors can occur which can change the documentation meaning.   Final Clinical Impressions(s) / ED Diagnoses   Final diagnoses:  Left hip pain    ED Discharge Orders    None       Lennice Sites, DO 10/18/17 1630

## 2017-10-19 ENCOUNTER — Encounter (HOSPITAL_COMMUNITY): Payer: Self-pay | Admitting: Internal Medicine

## 2017-10-19 DIAGNOSIS — M25559 Pain in unspecified hip: Secondary | ICD-10-CM | POA: Diagnosis present

## 2017-10-19 DIAGNOSIS — R262 Difficulty in walking, not elsewhere classified: Secondary | ICD-10-CM

## 2017-10-19 DIAGNOSIS — M25552 Pain in left hip: Secondary | ICD-10-CM | POA: Diagnosis not present

## 2017-10-19 MED ORDER — SODIUM CHLORIDE 0.9 % IV SOLN: 250.0000 mL | INTRAVENOUS | Status: DC | PRN

## 2017-10-19 MED ORDER — MESALAMINE 400 MG PO CPDR
800.0000 mg | DELAYED_RELEASE_CAPSULE | Freq: Every day | ORAL | Status: DC
  Administered 2017-10-19 – 2017-10-20 (×2): 800 mg via ORAL
  Filled 2017-10-19 (×2): qty 2

## 2017-10-19 MED ORDER — ONDANSETRON HCL 4 MG/2ML IJ SOLN: 4.0000 mg | Freq: Four times a day (QID) | INTRAMUSCULAR | Status: DC | PRN

## 2017-10-19 MED ORDER — HYDROCHLOROTHIAZIDE 12.5 MG PO CAPS
12.5000 mg | ORAL_CAPSULE | Freq: Every day | ORAL | Status: DC
  Filled 2017-10-19: qty 1

## 2017-10-19 MED ORDER — LOSARTAN POTASSIUM 50 MG PO TABS
50.0000 mg | ORAL_TABLET | Freq: Every day | ORAL | Status: DC
  Administered 2017-10-20: 50 mg via ORAL
  Filled 2017-10-19: qty 1

## 2017-10-19 MED ORDER — LOSARTAN POTASSIUM-HCTZ 50-12.5 MG PO TABS: 1.0000 | ORAL_TABLET | Freq: Every day | ORAL | Status: DC

## 2017-10-19 MED ORDER — ONDANSETRON HCL 4 MG PO TABS: 4.0000 mg | ORAL_TABLET | Freq: Four times a day (QID) | ORAL | Status: DC | PRN

## 2017-10-19 MED ORDER — SODIUM CHLORIDE 0.9% FLUSH: 3.0000 mL | INTRAVENOUS | Status: DC | PRN

## 2017-10-19 MED ORDER — ACETAMINOPHEN 325 MG PO TABS: 650.0000 mg | ORAL_TABLET | Freq: Four times a day (QID) | ORAL | Status: DC | PRN

## 2017-10-19 MED ORDER — TRAMADOL HCL 50 MG PO TABS: 100.0000 mg | ORAL_TABLET | Freq: Four times a day (QID) | ORAL | Status: DC | PRN

## 2017-10-19 MED ORDER — ENOXAPARIN SODIUM 60 MG/0.6ML ~~LOC~~ SOLN
60.0000 mg | SUBCUTANEOUS | Status: DC
  Administered 2017-10-19: 60 mg via SUBCUTANEOUS
  Filled 2017-10-19 (×2): qty 0.6

## 2017-10-19 MED ORDER — ACETAMINOPHEN 650 MG RE SUPP: 650.0000 mg | Freq: Four times a day (QID) | RECTAL | Status: DC | PRN

## 2017-10-19 MED ORDER — POLYETHYLENE GLYCOL 3350 17 G PO PACK: 17.0000 g | PACK | Freq: Every day | ORAL | Status: DC | PRN

## 2017-10-19 MED ORDER — SODIUM CHLORIDE 0.9% FLUSH: 3.0000 mL | Freq: Two times a day (BID) | INTRAVENOUS | Status: DC

## 2017-10-19 NOTE — Progress Notes (Signed)
CSW received handoff from 2nd shift CSW last night. CSW aware of plan to observe patient overnight and likely discharge the patient home today.  CSW contacted EDP for update on patient. EDP explained patient is still waiting for PT consult.   CSW will follow up with patient after PT evaluation completed.  Stephanie Acre, [place] Social Worker [phone]

## 2017-10-19 NOTE — Progress Notes (Signed)
CSW notes patient moved from [facility] to inpatient under observation. CSW met with patient at bedside to review barriers to SNF with insurance authorization. Patient and patient's wife verbally acknowledge understanding that a hospital stay under observation will not count toward days hospitalized for insurance authorization for SNF.  Patient and wife aware that PT recommends SNF, but orthopedics did not make recommendations. CSW reviewed SNF criteria of an acute rehabilitative condition.  Patient and wife aware of barriers to insurance authorization for SNF. Patient and wife aware that paying for SNF out of pocket will likely require patient to pay several thousand dollars up front for a 30 day stay, which may offer a partial refund if patient were to leave SNF before the 30 days were up. Patient and wife state money is not an issue and would like to get the best care for patient.  CSW spoke with patient's wife via phone. Patient's wife states he cannot return home because he is unable to walk. CSW notes that PT evaluated the patient as being able to walk 20 feet with a walker. Wife states patient's bedroom is upstairs and would not be able to access his bed. CSW explained this is typically what insurance considers "custodial care" and will not cover SNF stay for.  Wife asked CSW if the patient would be discharged home "without a plan in place or without resources." CSW explained that the patient is still being followed by social work and case management and upon discharge, the patient could refuse or decline those services.   Patient and his wife state that they understand the patient is currently hospitalized under observation, but hope the patient's status will change to admitted after further review tomorrow. In which case, they hope to meet criteria for SNF with insurance authorization. Otherwise, patient and wife may be receptive to paying out of pocket for SNF.  CSW provided patient with a printed  copy of SNF listings in [place], should patient or wife wish to pursue SNF placement.  Stephanie Acre, [place] Social Worker [phone]

## 2017-10-19 NOTE — Clinical Social Work Note (Signed)
Clinical Social Work Assessment  Patient Details  Name: COURTLAND REAS MRN: [id number] Date of Birth: 1945/12/28  Date of referral:  10/19/17               Reason for consult:  Discharge Planning, Facility Placement                Permission sought to share information with:  Case Manager Permission granted to share information::  Yes, Verbal Permission Granted  Name::        Agency::     Relationship::     Contact Information:     Housing/Transportation Living arrangements for the past 2 months:  Single Family Home Source of Information:  Patient Patient Interpreter Needed:  None Criminal Activity/Legal Involvement Pertinent to Current Situation/Hospitalization:  No - Comment as needed Significant Relationships:  Spouse Lives with:  Spouse Do you feel safe going back to the place where you live?  No Need for family participation in patient care:  No (Coment)  Care giving concerns:  Patient arrives with c/o fall. Patient had a fall from standing, fell on left side. Hx left hip replacement with rod 16 and 22 years ago. Patient reports ability to walk on left leg after. Pain worse with movement. Denies hitting head.  Patient reports not feeling safe discharging home.  Social Worker assessment / plan:  CSW met with patient at bedside to introduce self, complete assessment, and discuss discharge plans.  Patient lives at home with wife and reports that he is able to walk and navigate his home without assistive devices at baseline. Patient reports two hip replacement surgeries: 16 years ago and 22 years ago, respectively. Patient is not currently followed by Home Health or receiving PT/OT.   The patient states he does not feel safe discharging home at this time. Patient aware of insurance barriers and unmet medical criteria for SNF placement. Patient receptive to paying out of pocket for a shorter term SNF placement. Patient states "I think I need a week in rehab," and would be  receptive to paying for one week at a SNF. CSW explained that typically SNF's will require one month upfront payment, patient not agreeable, restating he feels he needs one week. Patient not agreeable to discharging home with Home Health and PT/OT, citing that he lives in a home with stairs and that his bedroom upstairs. Patient lives with his wife, but reports she works out of the home.   Employment status:  Retired Forensic scientist:  (S) Other (Comment Required)([employer] ) PT Recommendations:  Not assessed at this time Information / Referral to community resources:     Patient/Family's Response to care:  Patient is pleasant.  Patient/Family's Understanding of and Emotional Response to Diagnosis, Current Treatment, and Prognosis:   Patient voices understanding of barriers to SNF but still wishes to pursue placement. Patient declines HH and PT at this time.  Emotional Assessment Appearance:  Appears stated age Attitude/Demeanor/Rapport:    Affect (typically observed):  Pleasant, Appropriate Orientation:  Oriented to Self, Oriented to Place, Oriented to  Time, Oriented to Situation Alcohol / Substance use:  Not Applicable Psych involvement (Current and /or in the community):  No (Comment)  Discharge Needs  Concerns to be addressed:  Discharge Planning Concerns, Care Coordination Readmission within the last 30 days:  No Current discharge risk:  None Barriers to Discharge:  No Barriers Identified   Joellen Jersey, [place] 10/19/2017, 11:57 AM

## 2017-10-19 NOTE — H&P (Signed)
History and Physical    Steven Blair IBB:[id number] DOB: 01/14/1946 DOA: 10/18/2017  PCP: Josetta Huddle, MD   Patient coming from: home    Chief Complaint: fall, R hip pain  HPI: Steven Blair is a 72 y.o. male with medical history significant of class III obesity, history of atrial fibrillation status post cardioversion, DVT status post completion of rivaroxaban, OSA untreated, ulcerative colitis on mesalamine, hypertension who comes in after mechanical fall and severe right hip pain inability to ambulate.  Patient apparently has had bilateral hip replacements and revision on the left after periprosthetic fracture who had a mechanical fall.  Patient currently tripped and landed on his left hip earlier on 10/17/2017.  Patient was unable to ambulate despite medication source.  X-ray, CT, MRI showed no evidence of fracture.  Orthopedics was consulted and did not have any new recommendations.  Patient was evaluated by physical therapy who recommended skilled nursing facility placement for rehab.  Patient is being admitted for this.  Patient denies any lower extremity numbness or tingling.  He denies any back pain.  He denies any loss of control of bladder or bowel.  He has not had any gait disturbance other than that limited by pain.  He denies any chest pain, palpitations, cough, orthopnea, worsening lower extremity edema.  ED Course: In the ED vitals were stable.  Labs were unremarkable.  Imaging was unremarkable.  Patient was evaluated by PT who recommended skilled nursing facility placement.  Review of Systems: As per HPI otherwise 10 point review of systems negative.    Past Medical History:  Diagnosis Date  . BMI 40.0-44.9, adult (HCC)    Greater than 40 BMI  . 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 Xarelto - no longer taking  . Dysrhythmia    Atrial Fib x1 episode, cardioversion 4'16- NSR  . History of kidney stones    x1 -20 yrs ago  . Hypertension   . Sleep  apnea    no cpap used, couldn't tolerate  . Ulcerative colitis ([place])    history of , some current issues at this time    Past Surgical History:  Procedure Laterality Date  . CARDIOVERSION     tx Paroxysmal Atrial Fibrillation- converted to NSR  . COLONOSCOPY W/ POLYPECTOMY    . FEMUR SURGERY Left    ORIF 10'05  . FLEXIBLE SIGMOIDOSCOPY N/A 11/01/2014   Procedure: FLEXIBLE SIGMOIDOSCOPY;  Surgeon: Garlan Fair, MD;  Location: [facility];  Service: Endoscopy;  Laterality: N/A;  . JOINT REPLACEMENT     BTHA     reports that he has never smoked. He has never used smokeless tobacco. He reports that he drinks alcohol. He reports that he does not use drugs.  Allergies  Allergen Reactions  . Sulfa Antibiotics Other (See Comments)    Childhood allergy    Family History  Problem Relation Age of Onset  . Lung cancer Brother      Prior to Admission medications   Medication Sig Start Date End Date Taking? Authorizing Provider  losartan-hydrochlorothiazide (HYZAAR) 50-12.5 MG tablet Take 1 tablet by mouth daily.   Yes [provider]  Mesalamine 800 MG TBEC Take 800 mg by mouth daily. 10/17/17  Yes [provider]    Physical Exam: Vitals:   10/18/17 1125 10/18/17 1343 10/18/17 1702 10/19/17 0535  BP: 140/61 (!) 158/71 (!) 143/61 (!) 143/72  Pulse: 84 73 73 62  Resp: 14 16 16  16  Temp:    (!) 97.5 F (36.4 C)  TempSrc:    Oral  SpO2: 98% 95% 95% 96%  Weight: 124.3 kg     Height: 6' (1.829 m)       Constitutional: NAD, calm, comfortable Vitals:   10/18/17 1125 10/18/17 1343 10/18/17 1702 10/19/17 0535  BP: 140/61 (!) 158/71 (!) 143/61 (!) 143/72  Pulse: 84 73 73 62  Resp: 14 16 16 16   Temp:    (!) 97.5 F (36.4 C)  TempSrc:    Oral  SpO2: 98% 95% 95% 96%  Weight: 124.3 kg     Height: 6' (1.829 m)      Eyes: Anicteric sclera ENMT: Moist mucous membranes good dentition.  Neck: [place] Respiratory: clear to auscultation bilaterally, no  wheezing, no crackles. Normal respiratory effort. No accessory muscle use.  Cardiovascular: Regular rate and rhythm, no murmurs.  Abdomen: no tenderness, no masses palpated. No hepatosplenomegaly. Bowel sounds positive.  Musculoskeletal: 1+ lower extremity edema, pain on internal rotation and flexion of the left hip localized to lateral portion of the hip, intact distal pulses.  Skin: no acute rash Neurologic: Bilateral lower extremity strength is 5 out of 5 but limited by pain on left Psychiatric: Normal judgment and insight. Alert and oriented x 3. Normal mood.     Labs on Admission: I have personally reviewed following labs and imaging studies  CBC: Recent Labs  Lab 10/18/17 1828  WBC 9.1  HGB 15.2  HCT 45.1  MCV 103.0*  PLT 088   Basic Metabolic Panel: Recent Labs  Lab 10/18/17 1828  NA 140  K 4.1  CL 105  CO2 24  GLUCOSE 101*  BUN 29*  CREATININE 1.11  CALCIUM 9.1   GFR: Estimated Creatinine Clearance: 83.1 mL/min (by C-G formula based on SCr of 1.11 mg/dL). Liver Function Tests: No results for input(s): AST, ALT, ALKPHOS, BILITOT, PROT, ALBUMIN in the last 168 hours. No results for input(s): LIPASE, AMYLASE in the last 168 hours. No results for input(s): AMMONIA in the last 168 hours. Coagulation Profile: No results for input(s): INR, PROTIME in the last 168 hours. Cardiac Enzymes: No results for input(s): CKTOTAL, CKMB, CKMBINDEX, TROPONINI in the last 168 hours. BNP (last 3 results) No results for input(s): PROBNP in the last 8760 hours. HbA1C: No results for input(s): HGBA1C in the last 72 hours. CBG: No results for input(s): GLUCAP in the last 168 hours. Lipid Profile: No results for input(s): CHOL, HDL, LDLCALC, TRIG, CHOLHDL, LDLDIRECT in the last 72 hours. Thyroid Function Tests: No results for input(s): TSH, T4TOTAL, FREET4, T3FREE, THYROIDAB in the last 72 hours. Anemia Panel: No results for input(s): VITAMINB12, FOLATE, FERRITIN, TIBC, IRON,  RETICCTPCT in the last 72 hours. Urine analysis:    Component Value Date/Time   COLORURINE YELLOW 11/08/2014 1440   APPEARANCEUR CLEAR 11/08/2014 1440   LABSPEC 1.016 11/08/2014 1440   PHURINE 6.5 11/08/2014 1440   GLUCOSEU NEGATIVE 11/08/2014 1440   HGBUR NEGATIVE 11/08/2014 1440   BILIRUBINUR NEGATIVE 11/08/2014 1440   KETONESUR 15 (A) 11/08/2014 1440   PROTEINUR NEGATIVE 11/08/2014 1440   UROBILINOGEN 0.2 11/08/2014 1440   NITRITE NEGATIVE 11/08/2014 1440   LEUKOCYTESUR NEGATIVE 11/08/2014 1440    Radiological Exams on Admission: Dg Lumbar Spine Complete  Result Date: 10/18/2017 CLINICAL DATA:  Fall EXAM: LUMBAR SPINE - COMPLETE 4+ VIEW COMPARISON:  CT abdomen/pelvis dated 10/31/2014 FINDINGS: Five lumbar-type vertebral bodies. Normal lumbar lordosis. Mild wedging at L1, chronic. No evidence of fracture  or dislocation. Vertebral body heights are otherwise maintained. Moderate multilevel degenerative changes. Visualized bony pelvis appears intact. Postsurgical changes related to bilateral hip arthroplasty, incompletely visualized. IMPRESSION: No fracture or dislocation is seen. Moderate multilevel degenerative changes. Electronically Signed   By: Julian Hy M.D.   On: 10/18/2017 19:13   Ct Hip Left Wo Contrast  Result Date: 10/18/2017 CLINICAL DATA:  Status post fall from standing. Pain worse with movement EXAM: CT OF THE LEFT HIP WITHOUT CONTRAST TECHNIQUE: Multidetector CT imaging of the left hip was performed according to the standard protocol. Multiplanar CT image reconstructions were also generated. COMPARISON:  None. FINDINGS: Bones/Joint/Cartilage Left total hip arthroplasty with beam hardening artifact partially obscuring the adjacent soft tissue and osseous structures. No hardware failure or complication. No osteolysis or periarticular fluid collection. No acute fracture or dislocation. Lateral proximal and mid femoral diaphysis sideplate with multiple interlocking screws  and cerclage wires transfixing the diaphysis without failure or complication. The distal most aspect of the sideplate is excluded from the field of view. Mild osteoarthritis of left sacroiliac joint. No aggressive osseous lesion. Normal alignment. No joint effusion. Ligaments Ligaments are suboptimally evaluated by CT. Muscles and Tendons No intramuscular fluid collection or hematoma. Mild generalized atrophy of the gluteal muscles. Soft tissue No fluid collection or hematoma. No soft tissue mass. Peripheral vascular atherosclerotic disease. Mild soft tissue contusion in the subcutaneous fat overlying the greater trochanter. IMPRESSION: 1. Left total hip arthroplasty without hardware failure or complication. No acute osseous abnormality of the left hip. 2. Lateral proximal and mid femoral diaphysis sideplate with multiple interlocking screws and cerclage wires transfixing the diaphysis without failure or complication. The distal most aspect of the sideplate is excluded from the field of view. At Electronically Signed   By: Kathreen Devoid   On: 10/18/2017 17:01   Mr Hip Left Wo Contrast  Result Date: 10/18/2017 CLINICAL DATA:  Left hip pain status post fall. EXAM: MR OF THE LEFT HIP WITHOUT CONTRAST TECHNIQUE: Multiplanar, multisequence MR imaging was performed. No intravenous contrast was administered. COMPARISON:  None. FINDINGS: Bones/Joint/Cartilage Bilateral total hip arthroplasties with susceptibility artifact partially obscuring the adjacent soft tissue and osseous structures. No periarticular fluid collection. No osteolysis. No acute fracture or dislocation. Normal alignment. No joint effusion. Degenerative disc disease with disc height loss throughout the lumbar spine. Broad-based disc bulge at L4-5. Moderate bilateral facet arthropathy at L4-5. Moderate spinal stenosis at L4-5 with bilateral lateral recess stenosis and foraminal stenosis. Moderate right and mild left facet arthropathy at L5-S1. Moderate  bilateral facet arthropathy at L3-4 and a broad-based disc osteophyte complex with moderate spinal stenosis. Sacrum and sacroiliac joints are intact without widening or erosive changes. No sacral fracture. Ligaments, Muscles and Tendons Generalized atrophy of bilateral gluteal musculature. Muscles are otherwise normal in signal. No muscle edema. No intramuscular fluid collection. Soft tissue No fluid collection or hematoma.  No soft tissue mass. IMPRESSION: 1. No acute injury of the left hip. 2. No soft tissue injury around the left hip. 3. Lumbar spine spondylosis partially visualized. Electronically Signed   By: Kathreen Devoid   On: 10/18/2017 15:27   Dg Hip Unilat With Pelvis 2-3 Views Left  Result Date: 10/18/2017 CLINICAL DATA:  Status post fall.  Left-sided pain EXAM: DG HIP (WITH OR WITHOUT PELVIS) 2-3V LEFT COMPARISON:  None. FINDINGS: Generalized osteopenia. No acute fracture or dislocation. Bilateral total hip arthroplasties. Left lateral sideplate and multiple interlocking screws and cerclage wires without failure or complication. Distal aspect of  the left femoral distal sideplate is not included in the field of view. Mild degenerative changes of bilateral sacroiliac joints and lower lumbar spine. IMPRESSION: No acute osseous injury of the left hip. Electronically Signed   By: Kathreen Devoid   On: 10/18/2017 12:58   Dg Femur Min 2 Views Left  Result Date: 10/18/2017 CLINICAL DATA:  Pain status post fall EXAM: LEFT FEMUR 2 VIEWS COMPARISON:  None. FINDINGS: Generalized osteopenia. Left total hip arthroplasty without failure complication. Lateral femoral side plate and multiple interlocking screws and cerclage wires transfixing a healed fracture. No aggressive osseous lesion. Mild medial femorotibial compartment joint space narrowing. IMPRESSION: No acute osseous injury of the left femur. Electronically Signed   By: Kathreen Devoid   On: 10/18/2017 13:01    EKG: Independently reviewed.  None  performed  Assessment/Plan Active Problems:   Colitis   History of DVT of lower extremity   Sleep apnea   Hypertension   BMI 40.0-44.9, adult (HCC)   Inability to walk   Hip pain    #)  left hip pain: Patient is unable to ambulate.  There is no evidence of occult fracture on imaging.  Patient is being admitted for skilled nursing facility placement. -Pain control - PT will continue to follow -Social work consult for placement  #) Hypertension: -Continue losartan 50 mg daily -Continue HCTZ 12.5 mg daily  #) Ulcerative colitis: -Continue mesalamine 800 mg daily  #) History of A. fib status post cardioversion/history of DVT: Currently patient is not on anti-anticoagulation.  #) OSA: Patient currently did not tolerate CPAP  Fluids: Tolerating p.o. Elect lites: Monitor and supplement Nutrition: Heart healthy diet Exam prophylaxis: Enoxaparin  Disposition: Pending skilled nursing facility placement  Full code   Cristy Folks MD [facility]   If 7PM-7AM, please contact night-coverage [email or web address] Password [email or web address]  10/19/2017, 1:57 PM

## 2017-10-19 NOTE — Progress Notes (Signed)
Pt states that he does not use CPAP at home and has declined use of CPAP during hospital stay.  RT to monitor and assess as needed.

## 2017-10-19 NOTE — Progress Notes (Addendum)
CSW met with patient at bedside to introduce self, complete assessment, and discuss discharge plans.  Patient lives at home with wife and reports that he is able to walk and navigate his home without assistive devices at baseline. Patient reports two hip replacement surgeries: 16 years ago and 22 years ago, respectively. Patient is not currently followed by Home Health or receiving PT/OT.   The patient states he does not feel safe discharging home at this time. Patient aware of insurance barriers and unmet criteria for SNF placement at this time. Patient receptive to paying out of pocket for a shorter term SNF placement. Patient states "I think I need a week in rehab," and would be receptive to paying for one week at a SNF. CSW explained that typically SNF's will require one month upfront payment, patient not agreeable, restating he feels he needs one week.   Patient states he is not agreeable to discharging home with Home Health and PT/OT, citing that he lives in a home with stairs and that his bedroom upstairs. Patient lives with his wife, but reports she works out of the home.   CSW consulted SW Surveyor, quantity. AD advises CSW to involve case management to offer patient home health services, as the patient may not meet criteria for SNF, and pending EDP medical clearance and PT reccomendations will likely be discharging home.   Update 1pm:  CSW notes PT evaluation complete and currently recommending SNF with PT x3 weekly.  "Pt reports spouse unable to assist to extent he is currently needing for minimal mobilization.  Unless significant progress achieved in acute stay, pt would benefit from follow up rehab at SNF level."  CSW contacted EDP to determine if patient will be admitted inpatient or discharged home. EDP to review patient.  CSW continuing to follow.  Stephanie Acre, [place] Social Worker [phone]

## 2017-10-19 NOTE — ED Provider Notes (Signed)
Patient evaluated by physical therapy and orthopedics.  Orthopedics stated nothing to do at this time.  Physical therapy is recommending skilled nursing facility for acute rehab.  Patient feels improved following anti-inflammatories.  Medicine to admit for further physical therapy and possibly placement to skilled nursing facility.  Hemodynamically stable throughout my care.   Lennice Sites, DO 10/19/17 1355

## 2017-10-19 NOTE — Evaluation (Signed)
Physical Therapy Evaluation Patient Details Name: Steven Blair MRN: [id number] DOB: 06-Mar-1945 Today's Date: 10/19/2017   History of Present Illness  Pt admitted s/p fall with L hip pain and inability to ambulate.  Pt s/p bil THR and with rod in L femur.  MRI, CT and X-rays clear  Clinical Impression  Pt admitted as above and presenting with functional mobility limitations 2* decreased L LE strength/ROM and significant pain with movement.  Pt currently requiring increased time and significant assist to perform basic mobility tasks and unable to attempt stairs needed to access home 2* pain.  Pt reports spouse unable to assist to extent he is currently needing for minimal mobilization.  Unless significant progress achieved in acute stay, pt would benefit from follow up rehab at SNF level.    Follow Up Recommendations SNF    Equipment Recommendations  None recommended by PT    Recommendations for Other Services       Precautions / Restrictions Precautions Precautions: Fall Restrictions Weight Bearing Restrictions: No      Mobility  Bed Mobility Overal bed mobility: Needs Assistance Bed Mobility: Supine to Sit;Sit to Supine     Supine to sit: Mod assist;+2 for safety/equipment Sit to supine: Min assist;Mod assist   General bed mobility comments: Assist to manage L LE and significant assist to bring trunk to upright sitting - pt ltd by obesity`  Transfers Overall transfer level: Needs assistance Equipment used: Rolling walker (2 wheeled) Transfers: Sit to/from Stand Sit to Stand: Min assist;Mod assist;From elevated surface         General transfer comment: cues for LE management, transition position and use of UEs to self assist. Pt stood from elevated bed  Ambulation/Gait Ambulation/Gait assistance: Min assist;+2 safety/equipment Gait Distance (Feet): 20 Feet Assistive device: Rolling walker (2 wheeled) Gait Pattern/deviations: Step-to pattern;Decreased step length  - right;Decreased step length - left;Decreased stance time - left;Shuffle;Narrow base of support;Antalgic Gait velocity: decr   General Gait Details: Increased time, multiple rests 2* pain with advancing L LE fwd.  Stairs Stairs: (Not attempted 2* pain with ambulation)          Wheelchair Mobility    Modified Rankin (Stroke Patients Only)       Balance Overall balance assessment: Mild deficits observed, not formally tested                                           Pertinent Vitals/Pain Pain Assessment: 0-10 Pain Score: 8  Pain Location: R hip/thigh with movement Pain Descriptors / Indicators: Grimacing;Guarding;Stabbing;Tightness;Spasm Pain Intervention(s): Limited activity within patient's tolerance;Monitored during session;Premedicated before session;Ice applied    Home Living Family/patient expects to be discharged to:: Private residence Living Arrangements: Spouse/significant other Available Help at Discharge: Family Type of Home: House Home Access: Stairs to enter   Technical brewer of Steps: 1 Home Layout: Two level Home Equipment: Environmental consultant - 2 wheels;Bedside commode;Cane - single point      Prior Function Level of Independence: Independent         Comments: Pt IND prior to fall several days ago     Hand Dominance        Extremity/Trunk Assessment   Upper Extremity Assessment Upper Extremity Assessment: Overall WFL for tasks assessed    Lower Extremity Assessment Lower Extremity Assessment: LLE deficits/detail LLE Deficits / Details: Min AROM at hip but with gentle AAROM pt  able to achieve hip flex to 75 degrees and abd to 20 degrees    Cervical / Trunk Assessment Cervical / Trunk Assessment: Normal  Communication   Communication: No difficulties  Cognition Arousal/Alertness: Awake/alert Behavior During Therapy: WFL for tasks assessed/performed Overall Cognitive Status: Within Functional Limits for tasks assessed                                         General Comments      Exercises General Exercises - Lower Extremity Ankle Circles/Pumps: AROM;Both;15 reps;Supine Heel Slides: AAROM;Left;15 reps;Supine Hip ABduction/ADduction: AAROM;Left;15 reps;Supine   Assessment/Plan    PT Assessment Patient needs continued PT services  PT Problem List Decreased strength;Decreased range of motion;Decreased activity tolerance;Decreased balance;Decreased mobility;Decreased knowledge of use of DME;Obesity;Pain       PT Treatment Interventions DME instruction;Gait training;Stair training;Functional mobility training;Therapeutic activities;Therapeutic exercise;Patient/family education    PT Goals (Current goals can be found in the Care Plan section)  Acute Rehab PT Goals Patient Stated Goal: Improved pain control to regain IND PT Goal Formulation: With patient Time For Goal Achievement: 11/01/17 Potential to Achieve Goals: Good    Frequency Min 3X/week   Barriers to discharge Inaccessible home environment 18 stairs     Co-evaluation               AM-PAC PT "6 Clicks" Daily Activity  Outcome Measure Difficulty turning over in bed (including adjusting bedclothes, sheets and blankets)?: Unable Difficulty moving from lying on back to sitting on the side of the bed? : Unable Difficulty sitting down on and standing up from a chair with arms (e.g., wheelchair, bedside commode, etc,.)?: Unable Help needed moving to and from a bed to chair (including a wheelchair)?: A Little Help needed walking in hospital room?: A Little Help needed climbing 3-5 steps with a railing? : Total 6 Click Score: 10    End of Session Equipment Utilized During Treatment: Gait belt Activity Tolerance: Patient limited by pain Patient left: in bed;with call bell/phone within reach Nurse Communication: Mobility status PT Visit Diagnosis: Difficulty in walking, not elsewhere classified (R26.2);Pain;History of  falling (Z91.81) Pain - Right/Left: Left Pain - part of body: Hip    Time: 7473-4037 PT Time Calculation (min) (ACUTE ONLY): 35 min   Charges:   PT Evaluation $PT Eval Low Complexity: 1 Low PT Treatments $Gait Training: 8-22 mins        Debe Coder PT Acute Rehabilitation Services Pager [phone] Office [phone]   Damarea Merkel 10/19/2017, 12:43 PM

## 2017-10-19 NOTE — ED Notes (Signed)
Belongings placed in locker 29, grey sneakers, grey top and sleep pants noted at this time.

## 2017-10-19 NOTE — Consult Note (Addendum)
Patient ID: Steven Blair MRN: [id number] DOB/AGE: [birth year] 72 y.o.  Admit date: 10/18/2017  Admission Diagnoses:  Left Hip Pain   HPI: very pleasant 72 year old male pt presents to the ED after a fall on his left side yesterday in his home. The pt denies syncope.  He says he just tripped.  He denies pain anywhere besides his hip.    Pt denies pain when sitting or standing but say if he goes to take a step he has significant pain with lifting his leg.  The pt reports concerns of getting up an down steps in his home.  Pt also has a past med hx of Afib, no longer on blood thinner.  He also has hx of HTN, sleep apnea and UC.    Past Medical History: Past Medical History:  Diagnosis Date  . BMI 40.0-44.9, adult (HCC)    Greater than 40 BMI  . 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 Xarelto - no longer taking  . Dysrhythmia    Atrial Fib x1 episode, cardioversion 4'16- NSR  . History of kidney stones    x1 -20 yrs ago  . Hypertension   . Sleep apnea    no cpap used, couldn't tolerate  . Ulcerative colitis ([place])    history of , some current issues at this time    Surgical History: Past Surgical History:  Procedure Laterality Date  . CARDIOVERSION     tx Paroxysmal Atrial Fibrillation- converted to NSR  . COLONOSCOPY W/ POLYPECTOMY    . FEMUR SURGERY Left    ORIF 10'05  . FLEXIBLE SIGMOIDOSCOPY N/A 11/01/2014   Procedure: FLEXIBLE SIGMOIDOSCOPY;  Surgeon: Garlan Fair, MD;  Location: [facility];  Service: Endoscopy;  Laterality: N/A;  . JOINT REPLACEMENT     BTHA    Family History: No family history on file.  Social History: Social History   Socioeconomic History  . Marital status: Married    Spouse name: Not on file  . Number of children: Not on file  . Years of education: Not on file  . Highest education level: Not on file  Occupational History  . Not on file  Social Needs  . Financial resource strain: Not on file  . Food  insecurity:    Worry: Not on file    Inability: Not on file  . Transportation needs:    Medical: Not on file    Non-medical: Not on file  Tobacco Use  . Smoking status: Never Smoker  Substance and Sexual Activity  . Alcohol use: Yes    Comment: occ. social  . Drug use: No  . Sexual activity: Not on file  Lifestyle  . Physical activity:    Days per week: Not on file    Minutes per session: Not on file  . Stress: Not on file  Relationships  . Social connections:    Talks on phone: Not on file    Gets together: Not on file    Attends religious service: Not on file    Active member of club or organization: Not on file    Attends meetings of clubs or organizations: Not on file    Relationship status: Not on file  . Intimate partner violence:    Fear of current or ex partner: Not on file    Emotionally abused: Not on file    Physically abused: Not on file    Forced sexual activity: Not  on file  Other Topics Concern  . Not on file  Social History Narrative  . Not on file    Allergies: Sulfa antibiotics  Medications: I have reviewed the patient's current medications.  Vital Signs: Patient Vitals for the past 24 hrs:  BP Temp Temp src Pulse Resp SpO2 Height Weight  10/19/17 0535 (!) 143/72 (!) 97.5 F (36.4 C) Oral 62 16 96 % - -  10/18/17 1702 (!) 143/61 - - 73 16 95 % - -  10/18/17 1343 (!) 158/71 - - 73 16 95 % - -  10/18/17 1125 140/61 - - 84 14 98 % 6' (1.829 m) 124.3 kg    Radiology: Dg Lumbar Spine Complete  Result Date: 10/18/2017 CLINICAL DATA:  Fall EXAM: LUMBAR SPINE - COMPLETE 4+ VIEW COMPARISON:  CT abdomen/pelvis dated 10/31/2014 FINDINGS: Five lumbar-type vertebral bodies. Normal lumbar lordosis. Mild wedging at L1, chronic. No evidence of fracture or dislocation. Vertebral body heights are otherwise maintained. Moderate multilevel degenerative changes. Visualized bony pelvis appears intact. Postsurgical changes related to bilateral hip arthroplasty,  incompletely visualized. IMPRESSION: No fracture or dislocation is seen. Moderate multilevel degenerative changes. Electronically Signed   By: Julian Hy M.D.   On: 10/18/2017 19:13   Ct Hip Left Wo Contrast  Result Date: 10/18/2017 CLINICAL DATA:  Status post fall from standing. Pain worse with movement EXAM: CT OF THE LEFT HIP WITHOUT CONTRAST TECHNIQUE: Multidetector CT imaging of the left hip was performed according to the standard protocol. Multiplanar CT image reconstructions were also generated. COMPARISON:  None. FINDINGS: Bones/Joint/Cartilage Left total hip arthroplasty with beam hardening artifact partially obscuring the adjacent soft tissue and osseous structures. No hardware failure or complication. No osteolysis or periarticular fluid collection. No acute fracture or dislocation. Lateral proximal and mid femoral diaphysis sideplate with multiple interlocking screws and cerclage wires transfixing the diaphysis without failure or complication. The distal most aspect of the sideplate is excluded from the field of view. Mild osteoarthritis of left sacroiliac joint. No aggressive osseous lesion. Normal alignment. No joint effusion. Ligaments Ligaments are suboptimally evaluated by CT. Muscles and Tendons No intramuscular fluid collection or hematoma. Mild generalized atrophy of the gluteal muscles. Soft tissue No fluid collection or hematoma. No soft tissue mass. Peripheral vascular atherosclerotic disease. Mild soft tissue contusion in the subcutaneous fat overlying the greater trochanter. IMPRESSION: 1. Left total hip arthroplasty without hardware failure or complication. No acute osseous abnormality of the left hip. 2. Lateral proximal and mid femoral diaphysis sideplate with multiple interlocking screws and cerclage wires transfixing the diaphysis without failure or complication. The distal most aspect of the sideplate is excluded from the field of view. At Electronically Signed   By: Kathreen Devoid   On: 10/18/2017 17:01   Mr Hip Left Wo Contrast  Result Date: 10/18/2017 CLINICAL DATA:  Left hip pain status post fall. EXAM: MR OF THE LEFT HIP WITHOUT CONTRAST TECHNIQUE: Multiplanar, multisequence MR imaging was performed. No intravenous contrast was administered. COMPARISON:  None. FINDINGS: Bones/Joint/Cartilage Bilateral total hip arthroplasties with susceptibility artifact partially obscuring the adjacent soft tissue and osseous structures. No periarticular fluid collection. No osteolysis. No acute fracture or dislocation. Normal alignment. No joint effusion. Degenerative disc disease with disc height loss throughout the lumbar spine. Broad-based disc bulge at L4-5. Moderate bilateral facet arthropathy at L4-5. Moderate spinal stenosis at L4-5 with bilateral lateral recess stenosis and foraminal stenosis. Moderate right and mild left facet arthropathy at L5-S1. Moderate bilateral facet arthropathy at L3-4 and  a broad-based disc osteophyte complex with moderate spinal stenosis. Sacrum and sacroiliac joints are intact without widening or erosive changes. No sacral fracture. Ligaments, Muscles and Tendons Generalized atrophy of bilateral gluteal musculature. Muscles are otherwise normal in signal. No muscle edema. No intramuscular fluid collection. Soft tissue No fluid collection or hematoma.  No soft tissue mass. IMPRESSION: 1. No acute injury of the left hip. 2. No soft tissue injury around the left hip. 3. Lumbar spine spondylosis partially visualized. Electronically Signed   By: Kathreen Devoid   On: 10/18/2017 15:27   Dg Hip Unilat With Pelvis 2-3 Views Left  Result Date: 10/18/2017 CLINICAL DATA:  Status post fall.  Left-sided pain EXAM: DG HIP (WITH OR WITHOUT PELVIS) 2-3V LEFT COMPARISON:  None. FINDINGS: Generalized osteopenia. No acute fracture or dislocation. Bilateral total hip arthroplasties. Left lateral sideplate and multiple interlocking screws and cerclage wires without failure or  complication. Distal aspect of the left femoral distal sideplate is not included in the field of view. Mild degenerative changes of bilateral sacroiliac joints and lower lumbar spine. IMPRESSION: No acute osseous injury of the left hip. Electronically Signed   By: Kathreen Devoid   On: 10/18/2017 12:58   Dg Femur Min 2 Views Left  Result Date: 10/18/2017 CLINICAL DATA:  Pain status post fall EXAM: LEFT FEMUR 2 VIEWS COMPARISON:  None. FINDINGS: Generalized osteopenia. Left total hip arthroplasty without failure complication. Lateral femoral side plate and multiple interlocking screws and cerclage wires transfixing a healed fracture. No aggressive osseous lesion. Mild medial femorotibial compartment joint space narrowing. IMPRESSION: No acute osseous injury of the left femur. Electronically Signed   By: Kathreen Devoid   On: 10/18/2017 13:01    Labs: Recent Labs    10/18/17 1828  WBC 9.1  RBC 4.38  HCT 45.1  PLT 178   Recent Labs    10/18/17 1828  NA 140  K 4.1  CL 105  CO2 24  BUN 29*  CREATININE 1.11  GLUCOSE 101*  CALCIUM 9.1   No results for input(s): LABPT, INR in the last 72 hours.  Review of Systems: ROS  Physical Exam: Body mass index is 37.16 kg/m.  Physical Exam  Constitutional: He is oriented to person, place, and time. He appears well-developed and well-nourished.  HENT:  Head: Normocephalic.  GI: Soft.  Neurological: He is alert and oriented to person, place, and time.  Skin: Skin is warm and dry.  Psychiatric: He has a normal mood and affect. His behavior is normal. Judgment and thought content normal.   No edema or ecchymosis noted Pain elicited with rotation of left hip Pt utilizes a walker for ambulation  Assessment and Plan: Pt may DC and f/u with Dr. Elmyra Ricks in clinic this week Pt is waiting for PT conult  Ronette Deter, PAC for Steven Schools, MD [facility] [phone]  Imaging studies: no acute fracture Patient with increased pain after  a fall Agree with d/c if cleared by therapy Can f/u with Dr Maureen Ralphs this week

## 2017-10-20 DIAGNOSIS — K529 Noninfective gastroenteritis and colitis, unspecified: Secondary | ICD-10-CM | POA: Diagnosis not present

## 2017-10-20 DIAGNOSIS — I10 Essential (primary) hypertension: Secondary | ICD-10-CM

## 2017-10-20 DIAGNOSIS — M25552 Pain in left hip: Secondary | ICD-10-CM | POA: Diagnosis not present

## 2017-10-20 DIAGNOSIS — R739 Hyperglycemia, unspecified: Secondary | ICD-10-CM

## 2017-10-20 DIAGNOSIS — Z86718 Personal history of other venous thrombosis and embolism: Secondary | ICD-10-CM | POA: Diagnosis not present

## 2017-10-20 DIAGNOSIS — R262 Difficulty in walking, not elsewhere classified: Secondary | ICD-10-CM

## 2017-10-20 DIAGNOSIS — G4730 Sleep apnea, unspecified: Secondary | ICD-10-CM

## 2017-10-20 MED ORDER — TRAMADOL HCL 50 MG PO TABS: 100.0000 mg | ORAL_TABLET | Freq: Four times a day (QID) | ORAL | 0 refills | Status: DC | PRN

## 2017-10-20 MED ORDER — ONDANSETRON HCL 4 MG PO TABS: 4.0000 mg | ORAL_TABLET | Freq: Four times a day (QID) | ORAL | 0 refills | Status: DC | PRN

## 2017-10-20 MED ORDER — ACETAMINOPHEN 325 MG PO TABS: 650.0000 mg | ORAL_TABLET | Freq: Four times a day (QID) | ORAL | 0 refills | Status: DC | PRN

## 2017-10-20 MED ORDER — POLYETHYLENE GLYCOL 3350 17 G PO PACK: 17.0000 g | PACK | Freq: Every day | ORAL | 0 refills | Status: DC | PRN

## 2017-10-20 NOTE — Clinical Social Work Placement (Addendum)
Patient agreeable to pay privately at [facility] for physical therapy.  D/C summary sent  Nurse call report to: [phone] Spouse to tranport by personal car  CLINICAL SOCIAL WORK PLACEMENT  NOTE  Date:  10/20/2017  Patient Details  Name: Steven Blair MRN: [id number] Date of Birth: 05-Sep-1945  Clinical Social Work is seeking post-discharge placement for this patient at the [place] level of care (*CSW will initial, date and re-position this form in  chart as items are completed):  Yes   Patient/family provided with [place] Work Department's list of facilities offering this level of care within the geographic area requested by the patient (or if unable, by the patient's family).  Yes   Patient/family informed of their freedom to choose among providers that offer the needed level of care, that participate in Medicare, Medicaid or managed care program needed by the patient, have an available bed and are willing to accept the patient.  Yes   Patient/family informed of [place]'s ownership interest in [facility] and [facility], as well as of the fact that they are under no obligation to receive care at these facilities.  PASRR submitted to EDS on 10/20/17     PASRR number received on 10/20/17     Existing PASRR number confirmed on       FL2 transmitted to all facilities in geographic area requested by pt/family on       FL2 transmitted to all facilities within larger geographic area on 10/20/17     Patient informed that his/her managed care company has contracts with or will negotiate with certain facilities, including the following:        Yes   Patient/family informed of bed offers received.  Patient chooses bed at [facility]     Physician recommends and patient chooses bed at      Patient to be transferred to [facility] on 10/20/17.  Patient to be transferred to facility by Spouse    Patient family notified on 10/20/17 of  transfer.  Name of family member notified:  Spouse      PHYSICIAN       Additional Comment:    _______________________________________________ Lia Hopping, LCSW 10/20/2017, 2:46 PM

## 2017-10-20 NOTE — Discharge Summary (Signed)
Physician Discharge Summary  Steven Blair PJK:[id number] DOB: Aug 03, 1945 DOA: 10/18/2017  PCP: Josetta Huddle, MD  Admit date: 10/18/2017 Discharge date: 10/20/2017  Admitted From: Home Disposition: SNF  Recommendations for Outpatient Follow-up:  1. Follow up with PCP in 1-2 weeks 2. Follow up with Orthopedic Dr. Alvan Dame or Dr. Lyla Glassing 3. Please obtain CMP/CBC, Mag, Phos in one week 4. Please follow up on the following pending results:  Home Health: No Equipment/Devices: None recommended by SNF  Discharge Condition: Stable CODE STATUS: FULL CODE Diet recommendation: Heart Healthy Diet   Brief/Interim Summary: HPI per Dr. Thomes Dinning on 10/19/17 EMITT MAGLIONE is a 72 y.o. male with medical history significant of class III obesity, history of atrial fibrillation status post cardioversion, DVT status post completion of rivaroxaban, OSA untreated, ulcerative colitis on mesalamine, hypertension who comes in after mechanical fall and severe right hip pain inability to ambulate.  Patient apparently has had bilateral hip replacements and revision on the left after periprosthetic fracture who had a mechanical fall.  Patient currently tripped and landed on his left hip earlier on 10/17/2017.  Patient was unable to ambulate despite medication source.  X-ray, CT, MRI showed no evidence of fracture.  Orthopedics was consulted and did not have any new recommendations.  Patient was evaluated by physical therapy who recommended skilled nursing facility placement for rehab.  Patient is being admitted for this.  Patient denies any lower extremity numbness or tingling.  He denies any back pain.  He denies any loss of control of bladder or bowel.  He has not had any gait disturbance other than that limited by pain.  He denies any chest pain, palpitations, cough, orthopnea, worsening lower extremity edema.  ED Course: In the ED vitals were stable.  Labs were unremarkable.  Imaging was unremarkable.  Patient was  evaluated by PT who recommended skilled nursing facility placement.  **Was doing well had no complaints.  Was seen by orthopedics who reviewed imaging and feel that he has a muscle strain.  Recommending physical therapy and discharge to SNF and follow-up with outpatient orthopedics with Dr. Lyla Glassing or Dr. Laverta Baltimore within 4 weeks if no improvement.  Patient was deemed medically stable to be discharged and will need to follow-up with PCP as well  Discharge Diagnoses:  Active Problems:   Colitis   History of DVT of lower extremity   Sleep apnea   Hypertension   BMI 40.0-44.9, adult (HCC)   Inability to walk   Hip pain   Left hip pain  Left Hip Pain in the setting of Hip Strain -Patient is unable to ambulate.   -There is no evidence of occult fracture on imaging. -Patient was being admitted for skilled nursing facility placement. -Pain control with Tramadol -Seen by Orthopedics  -PT will continue to follow recommending SNF -Social work consult for placement -Follow-up with Orthopedics in 4 weeks  Hypertension -Continue Losartan 50 mg daily -Continue HCTZ 12.5 mg daily  Ulcerative Colitis -Continue Mesalamine 800 mg daily  History of A. Fib status post cardioversion/history of DVT -Currently patient is not on Anticoagulation.  OSA -Patient currently did not tolerate CPAP  Obesity -Estimated body mass index is 35.89 kg/m as calculated from the following:   Height as of this encounter: 6' 1"  (1.854 m).   Weight as of this encounter: 123.4 kg. -Weight Loss counseling given  Hyperglycemia -Patient blood sugar slightly elevated--check hemoglobin A see if there is outpatient -Follow with PCP  Discharge Instructions  Discharge Instructions  Call MD for:  difficulty breathing, headache or visual disturbances   Complete by:  As directed    Call MD for:  extreme fatigue   Complete by:  As directed    Call MD for:  hives   Complete by:  As directed    Call MD for:   persistant dizziness or light-headedness   Complete by:  As directed    Call MD for:  persistant nausea and vomiting   Complete by:  As directed    Call MD for:  redness, tenderness, or signs of infection (pain, swelling, redness, odor or green/yellow discharge around incision site)   Complete by:  As directed    Call MD for:  severe uncontrolled pain   Complete by:  As directed    Call MD for:  temperature >100.4   Complete by:  As directed    Diet - low sodium heart healthy   Complete by:  As directed    Discharge instructions   Complete by:  As directed    You were cared for by a hospitalist during your hospital stay. If you have any questions about your discharge medications or the care you received while you were in the hospital after you are discharged, you can call the unit and ask to speak with the hospitalist on call if the hospitalist that took care of you is not available. Once you are discharged, your primary care physician will handle any further medical issues. Please note that NO REFILLS for any discharge medications will be authorized once you are discharged, as it is imperative that you return to your primary care physician (or establish a relationship with a primary care physician if you do not have one) for your aftercare needs so that they can reassess your need for medications and monitor your lab values.  Follow up with PCP and Orthopedic Surgery as an outpatient. Take all medications as prescribed. If symptoms change or worsen please return to the ED for evaluation   Increase activity slowly   Complete by:  As directed      Allergies as of 10/20/2017      Reactions   Sulfa Antibiotics Other (See Comments)   Childhood allergy      Medication List    TAKE these medications   acetaminophen 325 MG tablet Commonly known as:  TYLENOL Take 2 tablets (650 mg total) by mouth every 6 (six) hours as needed for mild pain (or Fever >/= 101).   losartan-hydrochlorothiazide  50-12.5 MG tablet Commonly known as:  HYZAAR Take 1 tablet by mouth daily.   Mesalamine 800 MG Tbec Take 800 mg by mouth daily.   ondansetron 4 MG tablet Commonly known as:  ZOFRAN Take 1 tablet (4 mg total) by mouth every 6 (six) hours as needed for nausea.   polyethylene glycol packet Commonly known as:  MIRALAX / GLYCOLAX Take 17 g by mouth daily as needed for mild constipation.   traMADol 50 MG tablet Commonly known as:  ULTRAM Take 2 tablets (100 mg total) by mouth every 6 (six) hours as needed for moderate pain.       Allergies  Allergen Reactions  . Sulfa Antibiotics Other (See Comments)    Childhood allergy   Consultations:  Orthopedic Surgery  Procedures/Studies: Dg Lumbar Spine Complete  Result Date: 10/18/2017 CLINICAL DATA:  Fall EXAM: LUMBAR SPINE - COMPLETE 4+ VIEW COMPARISON:  CT abdomen/pelvis dated 10/31/2014 FINDINGS: Five lumbar-type vertebral bodies. Normal lumbar lordosis. Mild wedging at L1,  chronic. No evidence of fracture or dislocation. Vertebral body heights are otherwise maintained. Moderate multilevel degenerative changes. Visualized bony pelvis appears intact. Postsurgical changes related to bilateral hip arthroplasty, incompletely visualized. IMPRESSION: No fracture or dislocation is seen. Moderate multilevel degenerative changes. Electronically Signed   By: Julian Hy M.D.   On: 10/18/2017 19:13   Ct Hip Left Wo Contrast  Result Date: 10/18/2017 CLINICAL DATA:  Status post fall from standing. Pain worse with movement EXAM: CT OF THE LEFT HIP WITHOUT CONTRAST TECHNIQUE: Multidetector CT imaging of the left hip was performed according to the standard protocol. Multiplanar CT image reconstructions were also generated. COMPARISON:  None. FINDINGS: Bones/Joint/Cartilage Left total hip arthroplasty with beam hardening artifact partially obscuring the adjacent soft tissue and osseous structures. No hardware failure or complication. No osteolysis or  periarticular fluid collection. No acute fracture or dislocation. Lateral proximal and mid femoral diaphysis sideplate with multiple interlocking screws and cerclage wires transfixing the diaphysis without failure or complication. The distal most aspect of the sideplate is excluded from the field of view. Mild osteoarthritis of left sacroiliac joint. No aggressive osseous lesion. Normal alignment. No joint effusion. Ligaments Ligaments are suboptimally evaluated by CT. Muscles and Tendons No intramuscular fluid collection or hematoma. Mild generalized atrophy of the gluteal muscles. Soft tissue No fluid collection or hematoma. No soft tissue mass. Peripheral vascular atherosclerotic disease. Mild soft tissue contusion in the subcutaneous fat overlying the greater trochanter. IMPRESSION: 1. Left total hip arthroplasty without hardware failure or complication. No acute osseous abnormality of the left hip. 2. Lateral proximal and mid femoral diaphysis sideplate with multiple interlocking screws and cerclage wires transfixing the diaphysis without failure or complication. The distal most aspect of the sideplate is excluded from the field of view. At Electronically Signed   By: Kathreen Devoid   On: 10/18/2017 17:01   Mr Hip Left Wo Contrast  Result Date: 10/18/2017 CLINICAL DATA:  Left hip pain status post fall. EXAM: MR OF THE LEFT HIP WITHOUT CONTRAST TECHNIQUE: Multiplanar, multisequence MR imaging was performed. No intravenous contrast was administered. COMPARISON:  None. FINDINGS: Bones/Joint/Cartilage Bilateral total hip arthroplasties with susceptibility artifact partially obscuring the adjacent soft tissue and osseous structures. No periarticular fluid collection. No osteolysis. No acute fracture or dislocation. Normal alignment. No joint effusion. Degenerative disc disease with disc height loss throughout the lumbar spine. Broad-based disc bulge at L4-5. Moderate bilateral facet arthropathy at L4-5. Moderate  spinal stenosis at L4-5 with bilateral lateral recess stenosis and foraminal stenosis. Moderate right and mild left facet arthropathy at L5-S1. Moderate bilateral facet arthropathy at L3-4 and a broad-based disc osteophyte complex with moderate spinal stenosis. Sacrum and sacroiliac joints are intact without widening or erosive changes. No sacral fracture. Ligaments, Muscles and Tendons Generalized atrophy of bilateral gluteal musculature. Muscles are otherwise normal in signal. No muscle edema. No intramuscular fluid collection. Soft tissue No fluid collection or hematoma.  No soft tissue mass. IMPRESSION: 1. No acute injury of the left hip. 2. No soft tissue injury around the left hip. 3. Lumbar spine spondylosis partially visualized. Electronically Signed   By: Kathreen Devoid   On: 10/18/2017 15:27   Dg Hip Unilat With Pelvis 2-3 Views Left  Result Date: 10/18/2017 CLINICAL DATA:  Status post fall.  Left-sided pain EXAM: DG HIP (WITH OR WITHOUT PELVIS) 2-3V LEFT COMPARISON:  None. FINDINGS: Generalized osteopenia. No acute fracture or dislocation. Bilateral total hip arthroplasties. Left lateral sideplate and multiple interlocking screws and cerclage wires without failure  or complication. Distal aspect of the left femoral distal sideplate is not included in the field of view. Mild degenerative changes of bilateral sacroiliac joints and lower lumbar spine. IMPRESSION: No acute osseous injury of the left hip. Electronically Signed   By: Kathreen Devoid   On: 10/18/2017 12:58   Dg Femur Min 2 Views Left  Result Date: 10/18/2017 CLINICAL DATA:  Pain status post fall EXAM: LEFT FEMUR 2 VIEWS COMPARISON:  None. FINDINGS: Generalized osteopenia. Left total hip arthroplasty without failure complication. Lateral femoral side plate and multiple interlocking screws and cerclage wires transfixing a healed fracture. No aggressive osseous lesion. Mild medial femorotibial compartment joint space narrowing. IMPRESSION: No  acute osseous injury of the left femur. Electronically Signed   By: Kathreen Devoid   On: 10/18/2017 13:01    Subjective: Seen and examined bedside that his hip was feeling a little bit better.  He states he was able to stand with minimal pain however still very weak.  No chest pain, lightheadedness or dizziness.  Understands that he may need to pay out of pocket to go to SNF and is willing to do that and complete therapy prior to going home.  Patient deemed medically stable to be discharged to skilled nursing facility and need to follow-up outpatient with PCP and orthopedic surgery.  Discharge Exam: Vitals:   10/19/17 2232 10/20/17 0537  BP: 104/63 135/66  Pulse: 63 64  Resp: 17 17  Temp: 98.5 F (36.9 C) 98.2 F (36.8 C)  SpO2: 93% 97%   Vitals:   10/19/17 1444 10/19/17 1702 10/19/17 2232 10/20/17 0537  BP: (!) 108/47 (!) 114/56 104/63 135/66  Pulse: 66 71 63 64  Resp: 16  17 17   Temp: 98.3 F (36.8 C)  98.5 F (36.9 C) 98.2 F (36.8 C)  TempSrc: Oral  Oral Oral  SpO2:   93% 97%  Weight:  123.4 kg    Height:  6' 1"  (1.854 m)     General: Pt is alert, awake, not in acute distress Cardiovascular: RRR, S1/S2 +, no rubs, no gallops Respiratory: CTA bilaterally, no wheezing, no rhonchi Abdominal: Soft, NT, Distended 2/2 body habitus, bowel sounds + Extremities: no edema, no cyanosis  The results of significant diagnostics from this hospitalization (including imaging, microbiology, ancillary and laboratory) are listed below for reference.    Microbiology: No results found for this or any previous visit (from the past 240 hour(s)).   Labs: BNP (last 3 results) No results for input(s): BNP in the last 8760 hours. Basic Metabolic Panel: Recent Labs  Lab 10/18/17 1828  NA 140  K 4.1  CL 105  CO2 24  GLUCOSE 101*  BUN 29*  CREATININE 1.11  CALCIUM 9.1   Liver Function Tests: No results for input(s): AST, ALT, ALKPHOS, BILITOT, PROT, ALBUMIN in the last 168 hours. No  results for input(s): LIPASE, AMYLASE in the last 168 hours. No results for input(s): AMMONIA in the last 168 hours. CBC: Recent Labs  Lab 10/18/17 1828  WBC 9.1  HGB 15.2  HCT 45.1  MCV 103.0*  PLT 178   Cardiac Enzymes: No results for input(s): CKTOTAL, CKMB, CKMBINDEX, TROPONINI in the last 168 hours. BNP: Invalid input(s): POCBNP CBG: No results for input(s): GLUCAP in the last 168 hours. D-Dimer No results for input(s): DDIMER in the last 72 hours. Hgb A1c No results for input(s): HGBA1C in the last 72 hours. Lipid Profile No results for input(s): CHOL, HDL, LDLCALC, TRIG, CHOLHDL, LDLDIRECT in the  last 72 hours. Thyroid function studies No results for input(s): TSH, T4TOTAL, T3FREE, THYROIDAB in the last 72 hours.  Invalid input(s): FREET3 Anemia work up No results for input(s): VITAMINB12, FOLATE, FERRITIN, TIBC, IRON, RETICCTPCT in the last 72 hours. Urinalysis    Component Value Date/Time   COLORURINE YELLOW 11/08/2014 1440   APPEARANCEUR CLEAR 11/08/2014 1440   LABSPEC 1.016 11/08/2014 1440   PHURINE 6.5 11/08/2014 1440   GLUCOSEU NEGATIVE 11/08/2014 1440   HGBUR NEGATIVE 11/08/2014 1440   BILIRUBINUR NEGATIVE 11/08/2014 1440   KETONESUR 15 (A) 11/08/2014 1440   PROTEINUR NEGATIVE 11/08/2014 1440   UROBILINOGEN 0.2 11/08/2014 1440   NITRITE NEGATIVE 11/08/2014 1440   LEUKOCYTESUR NEGATIVE 11/08/2014 1440   Sepsis Labs Invalid input(s): PROCALCITONIN,  WBC,  LACTICIDVEN Microbiology No results found for this or any previous visit (from the past 240 hour(s)).  Time coordinating discharge: 25 minutes  SIGNED:  Kerney Elbe, DO [facility] 10/20/2017, 11:55 AM Pager is on [place]  If 7PM-7AM, please contact night-coverage [email or web address] Password [email or web address]

## 2017-10-20 NOTE — Progress Notes (Signed)
Patient seen and examined today. Is an established patient of Dr. Alvan Dame. States hip is feeling much better. No pain at rest or with standing; only has lateral thigh pain with ambulating. Imaging reviewed - xrays, CT and MRI of L hip - all negative for acute injury. No need for surgical intervention.  Suspect muscle strain. Cont PT and d/c when ready. Follow up with myself or Dr. Alvan Dame in 4 weeks if no improvement.

## 2017-10-20 NOTE — Plan of Care (Signed)

## 2017-10-20 NOTE — Care Management Obs Status (Signed)
[place] NOTIFICATION   Patient Details  Name: Steven Blair MRN: [id number] Date of Birth: November 27, 1945   Medicare Observation Status Notification Given:  Yes    Guadalupe Maple, RN 10/20/2017, 12:37 PM

## 2017-10-20 NOTE — NC FL2 (Signed)
[place] LEVEL OF CARE SCREENING TOOL     IDENTIFICATION  Patient Name: Steven Blair Birthdate: 1945-04-18 Sex: male Admission Date (Current Location): 10/18/2017  [facility] [place] Number:  Herbalist and Address:  [facility],  [address], [place]      Provider Number: [id number]  Attending Physician Name and Address:  Kerney Elbe, DO  Relative Name and Phone Number:       Current Level of Care: Hospital Recommended Level of Care: [place] Prior Approval Number:    Date Approved/Denied:   PASRR Number:    Discharge Plan: SNF    Current Diagnoses: Patient Active Problem List   Diagnosis Date Noted  . Inability to walk 10/19/2017  . Hip pain 10/19/2017  . Left hip pain 10/19/2017  . Protein-calorie malnutrition, severe 11/01/2014  . Lower GI bleed 10/31/2014  . Colitis 10/31/2014  . History of DVT of lower extremity 10/31/2014  . Sleep apnea   . Hypertension   . BMI 40.0-44.9, adult (HCC)     Orientation RESPIRATION BLADDER Height & Weight     Self, Situation, Time, Place  Normal Continent Weight: 272 lb (123.4 kg) Height:  6' 1"  (185.4 cm)  BEHAVIORAL SYMPTOMS/MOOD NEUROLOGICAL BOWEL NUTRITION STATUS      Continent Diet(Heart Healthy )  AMBULATORY STATUS COMMUNICATION OF NEEDS Skin   Extensive Assist   Normal                       Personal Care Assistance Level of Assistance  Bathing, Feeding, Dressing Bathing Assistance: Limited assistance Feeding assistance: Independent Dressing Assistance: Limited assistance     Functional Limitations Info  Sight, Hearing, Speech Sight Info: Impaired(Wears Glasses) Hearing Info: Adequate Speech Info: Adequate    SPECIAL CARE FACTORS FREQUENCY  PT (By licensed PT)     PT Frequency: 5x/week OT Frequency: 5x/week            Contractures Contractures Info: Not present    Additional Factors Info  Code Status,  Allergies, Psychotropic Code Status Info: Fullcode Allergies Info: Allergies: Sulfa Antibiotics           Current Medications (10/20/2017):  This is the current hospital active medication list Current Facility-Administered Medications  Medication Dose Route Frequency Provider Last Rate Last Dose  . 0.9 %  sodium chloride infusion  250 mL Intravenous PRN Purohit, Shrey C, MD      . acetaminophen (TYLENOL) tablet 650 mg  650 mg Oral Q6H PRN Purohit, Shrey C, MD       Or  . acetaminophen (TYLENOL) suppository 650 mg  650 mg Rectal Q6H PRN Purohit, Shrey C, MD      . enoxaparin (LOVENOX) injection 60 mg  60 mg Subcutaneous Q24H Purohit, Shrey C, MD   60 mg at 10/19/17 2215  . losartan (COZAAR) tablet 50 mg  50 mg Oral Daily Purohit, Shrey C, MD       Or  . hydrochlorothiazide (MICROZIDE) capsule 12.5 mg  12.5 mg Oral Daily Purohit, Shrey C, MD      . ibuprofen (ADVIL,MOTRIN) tablet 600 mg  600 mg Oral QID Purohit, Shrey C, MD   600 mg at 10/19/17 2215  . Mesalamine (ASACOL) DR capsule 800 mg  800 mg Oral Daily Purohit, Shrey C, MD   800 mg at 10/19/17 1705  . ondansetron (ZOFRAN) tablet 4 mg  4 mg Oral Q6H PRN Purohit, Konrad Dolores, MD  Or  . ondansetron (ZOFRAN) injection 4 mg  4 mg Intravenous Q6H PRN Purohit, Shrey C, MD      . polyethylene glycol (MIRALAX / GLYCOLAX) packet 17 g  17 g Oral Daily PRN Purohit, Shrey C, MD      . sodium chloride flush (NS) 0.9 % injection 3 mL  3 mL Intravenous Q12H Purohit, Shrey C, MD      . sodium chloride flush (NS) 0.9 % injection 3 mL  3 mL Intravenous PRN Purohit, Shrey C, MD      . traMADol (ULTRAM) tablet 100 mg  100 mg Oral Q6H PRN Purohit, Konrad Dolores, MD         Discharge Medications: Please see discharge summary for a list of discharge medications.  Relevant Imaging Results:  Relevant Lab Results:   Additional Information JGG:[id number]  Lia Hopping, LCSW

## 2017-10-20 NOTE — Progress Notes (Signed)
Physical Therapy Treatment Patient Details Name: Steven Blair MRN: [id number] DOB: 1946/01/07 Today's Date: 10/20/2017    History of Present Illness Pt admitted s/p fall with L hip pain and inability to ambulate.  Pt s/p bil THR and with rod in L femur.  MRI, CT and X-rays clear    PT Comments    Pt sitting EOB on arrival.  Assisted with amb a greater distance with walker.  Pt moving slowly and puts excessive WBing B UE's .  Pt unable to attempt stairs.  Pt plans to D/C to SNF for ST Rehab.    Follow Up Recommendations  SNF     Equipment Recommendations  None recommended by PT    Recommendations for Other Services       Precautions / Restrictions Precautions Precautions: Fall Restrictions Weight Bearing Restrictions: No    Mobility  Bed Mobility               General bed mobility comments: Pt sitting EOB on arrival  Transfers Overall transfer level: Needs assistance Equipment used: Rolling walker (2 wheeled) Transfers: Sit to/from Stand Sit to Stand: Min assist;Mod assist;From elevated surface         General transfer comment: cues for LE management, transition position and use of UEs to self assist. Pt stood from elevated bed with increased time   Ambulation/Gait Ambulation/Gait assistance: Min assist Gait Distance (Feet): 45 Feet Assistive device: Rolling walker (2 wheeled) Gait Pattern/deviations: Step-to pattern;Decreased step length - right;Decreased step length - left;Decreased stance time - left;Shuffle;Narrow base of support;Antalgic Gait velocity: decr   General Gait Details: increased time and tolerated an increased distance.  Still "sore" and noted excessive WB B UE's on walker.     Stairs             Wheelchair Mobility    Modified Rankin (Stroke Patients Only)       Balance                                            Cognition Arousal/Alertness: Awake/alert Behavior During Therapy: WFL for tasks  assessed/performed Overall Cognitive Status: Within Functional Limits for tasks assessed                                        Exercises  sitting EOB TE's  10 reps LAQ's  10 reps glut sqeezes    General Comments        Pertinent Vitals/Pain Pain Assessment: 0-10 Pain Score: 3  Pain Location: R hip/thigh with movement Pain Descriptors / Indicators: Grimacing;Guarding;Stabbing;Tightness;Spasm Pain Intervention(s): Monitored during session;Repositioned;Ice applied    Home Living                      Prior Function            PT Goals (current goals can now be found in the care plan section) Progress towards PT goals: Progressing toward goals    Frequency    Min 3X/week      PT Plan Current plan remains appropriate    Co-evaluation              AM-PAC PT "6 Clicks" Daily Activity  Outcome Measure  Difficulty turning over in bed (including adjusting bedclothes, sheets and blankets)?: A  Little Difficulty moving from lying on back to sitting on the side of the bed? : A Little Difficulty sitting down on and standing up from a chair with arms (e.g., wheelchair, bedside commode, etc,.)?: A Little Help needed moving to and from a bed to chair (including a wheelchair)?: A Little Help needed walking in hospital room?: A Little Help needed climbing 3-5 steps with a railing? : A Lot 6 Click Score: 17    End of Session Equipment Utilized During Treatment: Gait belt Activity Tolerance: Patient limited by pain Patient left: in bed;with call bell/phone within reach Nurse Communication: Mobility status PT Visit Diagnosis: Difficulty in walking, not elsewhere classified (R26.2);Pain;History of falling (Z91.81) Pain - Right/Left: Left Pain - part of body: Hip     Time: 1110-1135 PT Time Calculation (min) (ACUTE ONLY): 25 min  Charges:  $Gait Training: 8-22 mins $Therapeutic Activity: 8-22 mins                     Rica Koyanagi   PTA Acute  Rehabilitation Services Pager      [phone] Office      [phone]

## 2017-10-20 NOTE — Care Management Note (Signed)
Case Management Note  Patient Details  Name: Steven Blair MRN: [id number] Date of Birth: 03-02-1945  Subjective/Objective:   Plan for d/c to SNF, discharge planning per CSW. [phone]                 Action/Plan:   Expected Discharge Date:                  Expected Discharge Plan:  [place]  In-House Referral:  Clinical Social Work  Discharge planning Services  CM Consult  Post Acute Care Choice:  NA Choice offered to:  Patient  DME Arranged:  N/A DME Agency:  NA  HH Arranged:  NA HH Agency:  NA  Status of Service:  Completed, signed off  If discussed at [place] of Stay Meetings, dates discussed:    Additional Comments:  Guadalupe Maple, RN 10/20/2017, 10:04 AM

## 2018-10-14 ENCOUNTER — Other Ambulatory Visit: Payer: Self-pay

## 2018-10-14 ENCOUNTER — Ambulatory Visit (HOSPITAL_COMMUNITY)
Admission: RE | Admit: 2018-10-14 | Discharge: 2018-10-14 | Disposition: A | Discharge status: Home / Self Care | Payer: No Typology Code available for payment source | Source: Ambulatory Visit | Attending: Physician Assistant | Admitting: Physician Assistant

## 2018-10-14 ENCOUNTER — Encounter (HOSPITAL_COMMUNITY): Payer: Self-pay | Admitting: Physician Assistant

## 2018-10-14 VITALS — BP 132/76 | HR 113 | Ht 73.0 in | Wt 266.6 lb

## 2018-10-14 DIAGNOSIS — I4891 Unspecified atrial fibrillation: Secondary | ICD-10-CM

## 2018-10-14 DIAGNOSIS — I10 Essential (primary) hypertension: Secondary | ICD-10-CM | POA: Diagnosis not present

## 2018-10-14 DIAGNOSIS — G4733 Obstructive sleep apnea (adult) (pediatric): Secondary | ICD-10-CM | POA: Diagnosis not present

## 2018-10-14 DIAGNOSIS — Z881 Allergy status to other antibiotic agents status: Secondary | ICD-10-CM | POA: Insufficient documentation

## 2018-10-14 DIAGNOSIS — E785 Hyperlipidemia, unspecified: Secondary | ICD-10-CM | POA: Insufficient documentation

## 2018-10-14 DIAGNOSIS — Z79899 Other long term (current) drug therapy: Secondary | ICD-10-CM | POA: Insufficient documentation

## 2018-10-14 DIAGNOSIS — K5190 Ulcerative colitis, unspecified, without complications: Secondary | ICD-10-CM | POA: Insufficient documentation

## 2018-10-14 DIAGNOSIS — Z882 Allergy status to sulfonamides status: Secondary | ICD-10-CM | POA: Insufficient documentation

## 2018-10-14 DIAGNOSIS — Z7901 Long term (current) use of anticoagulants: Secondary | ICD-10-CM | POA: Insufficient documentation

## 2018-10-14 DIAGNOSIS — E669 Obesity, unspecified: Secondary | ICD-10-CM | POA: Diagnosis not present

## 2018-10-14 DIAGNOSIS — I480 Paroxysmal atrial fibrillation: Secondary | ICD-10-CM | POA: Insufficient documentation

## 2018-10-14 DIAGNOSIS — Z6835 Body mass index (BMI) 35.0-35.9, adult: Secondary | ICD-10-CM | POA: Diagnosis not present

## 2018-10-14 MED ORDER — APIXABAN 5 MG PO TABS: 5.0000 mg | ORAL_TABLET | Freq: Two times a day (BID) | ORAL | 3 refills | Status: DC

## 2018-10-14 MED ORDER — DILTIAZEM HCL ER COATED BEADS 120 MG PO CP24: 120.0000 mg | ORAL_CAPSULE | Freq: Every day | ORAL | 3 refills | Status: DC

## 2018-10-14 NOTE — Progress Notes (Signed)
Primary Care Physician: Josetta Huddle, MD Primary Cardiologist: none Primary Electrophysiologist: none Referring Physician: Dr Tammi Klippel is a 73 y.o. male with a history of ulcerative colitis, HTN, HLD, obesity, OSA, and paroxysmal atrial fibrillation who presents for consultation in the [facility].  The patient was initially diagnosed with atrial fibrillation remotely in the setting of hip surgery and acute DVT and has had no known recurrrance until presenting to his PCP for his annual phsyical where he was noted to have an irregular pulse. An ECG revealed rate controlled afib. He was started on warfarin and referred to the AF Clinic. Patient reports that he is completely unaware of his arrhythmia. He states that his quality of life is "better than it has been in 20 years." He is very active by walking about one hour daily. He reports that he used to weigh >300 lbs. Patient does have OSA but is not on CPAP after recent weight loss. He does admit to alcohol use, about two drinks daily.  Today, he denies symptoms of palpitations, chest pain, shortness of breath, orthopnea, PND, lower extremity edema, dizziness, presyncope, syncope, snoring, daytime somnolence, bleeding, or neurologic sequela. The patient is tolerating medications without difficulties and is otherwise without complaint today.    Atrial Fibrillation Risk Factors:  he does have symptoms or diagnosis of sleep apnea. he is not on CPAP therapy. he does not have a history of rheumatic fever. he does have a history of alcohol use. The patient does not have a history of early familial atrial fibrillation or other arrhythmias.  he has a BMI of Body mass index is 35.17 kg/m.Marland Kitchen Filed Weights   10/14/18 1007  Weight: 120.9 kg    Family History  Problem Relation Age of Onset  . Lung cancer Brother      Atrial Fibrillation Management history:  Previous antiarrhythmic drugs: none  Previous cardioversions: 6-7 years ago Previous ablations: none CHADS2VASC score: 2 Anticoagulation history: Xarelto (for DVT in the past), warfarin    Past Medical History:  Diagnosis Date  . BMI 40.0-44.9, adult (HCC)    Greater than 40 BMI  . 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 Xarelto - no longer taking  . Dysrhythmia    Atrial Fib x1 episode, cardioversion 4'16- NSR  . History of kidney stones    x1 -20 yrs ago  . Hypertension   . Sleep apnea    no cpap used, couldn't tolerate  . Ulcerative colitis ([place])    history of , some current issues at this time   Past Surgical History:  Procedure Laterality Date  . CARDIOVERSION     tx Paroxysmal Atrial Fibrillation- converted to NSR  . COLONOSCOPY W/ POLYPECTOMY    . FEMUR SURGERY Left    ORIF 10'05  . FLEXIBLE SIGMOIDOSCOPY N/A 11/01/2014   Procedure: FLEXIBLE SIGMOIDOSCOPY;  Surgeon: Garlan Fair, MD;  Location: [facility];  Service: Endoscopy;  Laterality: N/A;  . JOINT REPLACEMENT     BTHA    Current Outpatient Medications  Medication Sig Dispense Refill  . losartan-hydrochlorothiazide (HYZAAR) 100-25 MG tablet TK 1 T PO QD    . apixaban (ELIQUIS) 5 MG TABS tablet Take 1 tablet (5 mg total) by mouth 2 (two) times daily. 60 tablet 3  . diltiazem (CARDIZEM CD) 120 MG 24 hr capsule Take 1 capsule (120 mg total) by mouth daily. 30 capsule 3   No current  facility-administered medications for this encounter.     Allergies  Allergen Reactions  . Sulfa Antibiotics Other (See Comments)    Childhood allergy    Social History   Socioeconomic History  . Marital status: Married    Spouse name: Not on file  . Number of children: Not on file  . Years of education: Not on file  . Highest education level: Not on file  Occupational History  . Not on file  Social Needs  . Financial resource strain: Not on file  . Food insecurity    Worry: Not on file    Inability: Not on file  .  Transportation needs    Medical: Not on file    Non-medical: Not on file  Tobacco Use  . Smoking status: Never Smoker  . Smokeless tobacco: Never Used  Substance and Sexual Activity  . Alcohol use: Yes    Alcohol/week: 2.0 standard drinks    Types: 2 Cans of beer per week    Comment: daily  . Drug use: No  . Sexual activity: Not on file  Lifestyle  . Physical activity    Days per week: Not on file    Minutes per session: Not on file  . Stress: Not on file  Relationships  . Social Herbalist on phone: Not on file    Gets together: Not on file    Attends religious service: Not on file    Active member of club or organization: Not on file    Attends meetings of clubs or organizations: Not on file    Relationship status: Not on file  . Intimate partner violence    Fear of current or ex partner: Not on file    Emotionally abused: Not on file    Physically abused: Not on file    Forced sexual activity: Not on file  Other Topics Concern  . Not on file  Social History Narrative  . Not on file     ROS- All systems are reviewed and negative except as per the HPI above.  Physical Exam: Vitals:   10/14/18 1007  BP: 132/76  Pulse: (!) 113  Weight: 120.9 kg  Height: 6' 1"  (1.854 m)    GEN- The patient is well appearing obese male, alert and oriented x 3 today.   Head- normocephalic, atraumatic Eyes-  Sclera clear, conjunctiva pink Ears- hearing intact Oropharynx- clear Neck- supple  Lungs- Clear to ausculation bilaterally, normal work of breathing Heart- irregular rate and rhythm, no murmurs, rubs or gallops  GI- soft, NT, ND, + BS Extremities- no clubbing, cyanosis, or edema MS- no significant deformity or atrophy Skin- no rash or lesion Psych- euthymic mood, full affect Neuro- strength and sensation are intact  Wt Readings from Last 3 Encounters:  10/14/18 120.9 kg  10/19/17 123.4 kg  11/08/14 135.2 kg    EKG today demonstrates afib HR 113, LAFB,  QRS 90, QTc 477  Epic records are reviewed at length today  Assessment and Plan:  1. Atrial fibrillation Patient in afib today of unknown duration. General education about afib provided and questions answered.  We also discussed his stroke risk and the risks and benefits of anticoagulation.  Will start diltiazem 120 mg daily for rate control. After discussing risks and benefits of warfarin vs Eliquis, patient would like to try Eliquis. Start Eliquis 5 mg BID, stop warfarin. Will check echocardiogram once he his better rate controlled. Will plan on DCCV after 3 weeks of  uninterrupted anticoagulation. Ultimately, he may be a good candidate for rate control given his paucity of symptoms.   This patients CHA2DS2-VASc Score and unadjusted Ischemic Stroke Rate (% per year) is equal to 2.2 % stroke rate/year from a score of 2  Above score calculated as 1 point each if present [CHF, HTN, DM, Vascular=MI/PAD/Aortic Plaque, Age if 65-74, or Male] Above score calculated as 2 points each if present [Age > 75, or Stroke/TIA/TE]   2. Obesity Body mass index is 35.17 kg/m. Lifestyle modification was discussed at length including regular exercise and weight reduction. Patient doing very well with diet and daily exercise.   3. Obstructive sleep apnea The importance of adequate treatment of sleep apnea was discussed today in order to improve our ability to maintain sinus rhythm long term. Patient denies snoring since recent weight loss. Not currently on CPAP therapy.   4. HTN Stable, med changes as above.    Follow up in the AF clinic in one week.   [facility] [address] [place], [place] [address] [phone] 10/14/2018 10:47 AM

## 2018-10-14 NOTE — Patient Instructions (Signed)
Stop coumadin (warfarin)  Start Eliquis 72m twice a day  Start Diltiazem (cardizem) 1265monce a day

## 2018-10-21 ENCOUNTER — Ambulatory Visit (HOSPITAL_COMMUNITY): Payer: No Typology Code available for payment source | Admitting: Physician Assistant

## 2018-10-23 ENCOUNTER — Other Ambulatory Visit: Payer: Self-pay

## 2018-10-23 ENCOUNTER — Encounter (HOSPITAL_COMMUNITY): Payer: Self-pay | Admitting: Physician Assistant

## 2018-10-23 ENCOUNTER — Ambulatory Visit (HOSPITAL_COMMUNITY)
Admission: RE | Admit: 2018-10-23 | Discharge: 2018-10-23 | Disposition: A | Discharge status: Home / Self Care | Payer: No Typology Code available for payment source | Source: Ambulatory Visit | Attending: Physician Assistant | Admitting: Physician Assistant

## 2018-10-23 VITALS — BP 136/82 | HR 102 | Ht 73.0 in | Wt 267.4 lb

## 2018-10-23 DIAGNOSIS — Z6841 Body Mass Index (BMI) 40.0 and over, adult: Secondary | ICD-10-CM | POA: Diagnosis not present

## 2018-10-23 DIAGNOSIS — Z79899 Other long term (current) drug therapy: Secondary | ICD-10-CM | POA: Insufficient documentation

## 2018-10-23 DIAGNOSIS — Z7901 Long term (current) use of anticoagulants: Secondary | ICD-10-CM | POA: Diagnosis not present

## 2018-10-23 DIAGNOSIS — E669 Obesity, unspecified: Secondary | ICD-10-CM | POA: Diagnosis not present

## 2018-10-23 DIAGNOSIS — I4819 Other persistent atrial fibrillation: Secondary | ICD-10-CM | POA: Diagnosis not present

## 2018-10-23 DIAGNOSIS — E785 Hyperlipidemia, unspecified: Secondary | ICD-10-CM | POA: Insufficient documentation

## 2018-10-23 DIAGNOSIS — I10 Essential (primary) hypertension: Secondary | ICD-10-CM | POA: Insufficient documentation

## 2018-10-23 DIAGNOSIS — G4733 Obstructive sleep apnea (adult) (pediatric): Secondary | ICD-10-CM | POA: Diagnosis not present

## 2018-10-23 LAB — BASIC METABOLIC PANEL
Anion gap: 11 (ref 5–15)
BUN: 18 mg/dL (ref 8–23)
CO2: 24 mmol/L (ref 22–32)
Calcium: 8.8 mg/dL — ABNORMAL LOW (ref 8.9–10.3)
Chloride: 102 mmol/L (ref 98–111)
Creatinine, Ser: 0.84 mg/dL (ref 0.61–1.24)
GFR calc Af Amer: >60 mL/min (ref >60)
GFR calc non Af Amer: >60 mL/min (ref >60)
Glucose, Bld: 117 mg/dL — ABNORMAL HIGH (ref 70–99)
Potassium: 4 mmol/L (ref 3.5–5.1)
Sodium: 137 mmol/L (ref 135–145)

## 2018-10-23 LAB — CBC
HCT: 44.3 % (ref 39.0–52.0)
Hemoglobin: 14.6 g/dL (ref 13.0–17.0)
MCH: 34.7 pg — ABNORMAL HIGH (ref 26.0–34.0)
MCHC: 33 g/dL (ref 30.0–36.0)
MCV: 105.2 fL — ABNORMAL HIGH (ref 80.0–100.0)
Platelets: 200 10*3/uL (ref 150–400)
RBC: 4.21 MIL/uL — ABNORMAL LOW (ref 4.22–5.81)
RDW: 12 % (ref 11.5–15.5)
WBC: 5.8 10*3/uL (ref 4.0–10.5)
nRBC: 0 % (ref 0.0–0.2)

## 2018-10-23 NOTE — H&P (View-Only) (Signed)
Primary Care Physician: Josetta Huddle, MD Primary Cardiologist: none Primary Electrophysiologist: none Referring Physician: Dr Tammi Klippel is a 73 y.o. male with a history of ulcerative colitis, HTN, HLD, obesity, OSA, and paroxysmal atrial fibrillation who presents for consultation in the [facility].  The patient was initially diagnosed with atrial fibrillation remotely in the setting of hip surgery and acute DVT and has had no known recurrrance until presenting to his PCP for his annual phsyical where he was noted to have an irregular pulse. An ECG revealed rate controlled afib. He was started on warfarin and referred to the AF Clinic. Patient reports that he is completely unaware of his arrhythmia. He states that his quality of life is "better than it has been in 20 years." He is very active by walking about one hour daily. He reports that he used to weigh >300 lbs. Patient does have OSA but is not on CPAP after recent weight loss. He does admit to alcohol use, about two drinks daily.  On follow up today, patient reports that he feels well. He is tolerating the medication without issues. His heart rate at home has been in the 80s-low 90s. He has cut back his alcohol use to just 2 drinks weekly. He remains very active.   Today, he denies symptoms of palpitations, chest pain, shortness of breath, orthopnea, PND, lower extremity edema, dizziness, presyncope, syncope, snoring, daytime somnolence, bleeding, or neurologic sequela. The patient is tolerating medications without difficulties and is otherwise without complaint today.    Atrial Fibrillation Risk Factors:  he does have symptoms or diagnosis of sleep apnea. he is not on CPAP therapy. he does not have a history of rheumatic fever. he does have a history of alcohol use. The patient does not have a history of early familial atrial fibrillation or other arrhythmias.  he has a BMI of Body mass  index is 35.28 kg/m.Marland Kitchen Filed Weights   10/23/18 0935  Weight: 121.3 kg    Family History  Problem Relation Age of Onset  . Lung cancer Brother      Atrial Fibrillation Management history:  Previous antiarrhythmic drugs: none Previous cardioversions: 6-7 years ago Previous ablations: none CHADS2VASC score: 2 Anticoagulation history: Xarelto (for DVT in the past), warfarin    Past Medical History:  Diagnosis Date  . BMI 40.0-44.9, adult (HCC)    Greater than 40 BMI  . 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 Xarelto - no longer taking  . Dysrhythmia    Atrial Fib x1 episode, cardioversion 4'16- NSR  . History of kidney stones    x1 -20 yrs ago  . Hypertension   . Sleep apnea    no cpap used, couldn't tolerate  . Ulcerative colitis ([place])    history of , some current issues at this time   Past Surgical History:  Procedure Laterality Date  . CARDIOVERSION     tx Paroxysmal Atrial Fibrillation- converted to NSR  . COLONOSCOPY W/ POLYPECTOMY    . FEMUR SURGERY Left    ORIF 10'05  . FLEXIBLE SIGMOIDOSCOPY N/A 11/01/2014   Procedure: FLEXIBLE SIGMOIDOSCOPY;  Surgeon: Garlan Fair, MD;  Location: [facility];  Service: Endoscopy;  Laterality: N/A;  . JOINT REPLACEMENT     BTHA    Current Outpatient Medications  Medication Sig Dispense Refill  . apixaban (ELIQUIS) 5 MG TABS tablet Take 1 tablet (5 mg total) by mouth 2 (  two) times daily. 60 tablet 3  . diltiazem (CARDIZEM CD) 120 MG 24 hr capsule Take 1 capsule (120 mg total) by mouth daily. 30 capsule 3  . losartan-hydrochlorothiazide (HYZAAR) 100-25 MG tablet TK 1 T PO QD    . amoxicillin (AMOXIL) 500 MG capsule      No current facility-administered medications for this encounter.     Allergies  Allergen Reactions  . Sulfa Antibiotics Other (See Comments)    Childhood allergy    Social History   Socioeconomic History  . Marital status: Married    Spouse name: Not on file  . Number of  children: Not on file  . Years of education: Not on file  . Highest education level: Not on file  Occupational History  . Not on file  Social Needs  . Financial resource strain: Not on file  . Food insecurity    Worry: Not on file    Inability: Not on file  . Transportation needs    Medical: Not on file    Non-medical: Not on file  Tobacco Use  . Smoking status: Never Smoker  . Smokeless tobacco: Never Used  Substance and Sexual Activity  . Alcohol use: Yes    Alcohol/week: 2.0 standard drinks    Types: 2 Cans of beer per week    Comment: daily  . Drug use: No  . Sexual activity: Not on file  Lifestyle  . Physical activity    Days per week: Not on file    Minutes per session: Not on file  . Stress: Not on file  Relationships  . Social Herbalist on phone: Not on file    Gets together: Not on file    Attends religious service: Not on file    Active member of club or organization: Not on file    Attends meetings of clubs or organizations: Not on file    Relationship status: Not on file  . Intimate partner violence    Fear of current or ex partner: Not on file    Emotionally abused: Not on file    Physically abused: Not on file    Forced sexual activity: Not on file  Other Topics Concern  . Not on file  Social History Narrative  . Not on file     ROS- All systems are reviewed and negative except as per the HPI above.  Physical Exam: Vitals:   10/23/18 0935  BP: 136/82  Pulse: (!) 102  Weight: 121.3 kg  Height: 6' 1"  (1.854 m)    GEN- The patient is well appearing obese male, alert and oriented x 3 today.   HEENT-head normocephalic, atraumatic, sclera clear, conjunctiva pink, hearing intact, trachea midline. Lungs- Clear to ausculation bilaterally, normal work of breathing Heart- irregular rate and rhythm, no murmurs, rubs or gallops  GI- soft, NT, ND, + BS Extremities- no clubbing, cyanosis, or edema MS- no significant deformity or atrophy  Skin- no rash or lesion Psych- euthymic mood, full affect Neuro- strength and sensation are intact   Wt Readings from Last 3 Encounters:  10/23/18 121.3 kg  10/14/18 120.9 kg  10/19/17 123.4 kg    EKG today demonstrates afib HR 102, LAFB, QRS 90, QTc 471  Epic records are reviewed at length today  Assessment and Plan:  1. Persistent atrial fibrillation Patient reports that he feel well since starting the medication. Continue diltiazem 120 mg daily Continue Eliquis 5 mg BID Will check echocardiogram once he is  back in SR. Will plan on DCCV after 3 weeks of uninterrupted anticoagulation. CBC/Bmet today. Ultimately, he may be a good candidate for rate control given his paucity of symptoms.   This patients CHA2DS2-VASc Score and unadjusted Ischemic Stroke Rate (% per year) is equal to 2.2 % stroke rate/year from a score of 2  Above score calculated as 1 point each if present [CHF, HTN, DM, Vascular=MI/PAD/Aortic Plaque, Age if 65-74, or Male] Above score calculated as 2 points each if present [Age > 75, or Stroke/TIA/TE]   2. Obesity Body mass index is 35.28 kg/m. Lifestyle modification was discussed and encouraged including regular physical activity and weight reduction. Doing very well with regular physical activity.   3. Obstructive sleep apnea Patient denies snoring since recent weight loss. Not currently on CPAP therapy.   4. HTN Stable, no changes today.    Follow up in the AF clinic one week post DCCV.   [facility] [address] [place], [place] [address] [phone] 10/23/2018 10:07 AM

## 2018-10-23 NOTE — Patient Instructions (Signed)
Cardioversion scheduled for Friday, October 16th  - Arrive at the [facility] and go to admitting at 7:30AM  -Do not eat or drink anything after midnight the night prior to your procedure.  - Take all your morning medication with a sip of water prior to arrival.  - You will not be able to drive home after your procedure.

## 2018-10-23 NOTE — Progress Notes (Signed)
Primary Care Physician: Josetta Huddle, MD Primary Cardiologist: none Primary Electrophysiologist: none Referring Physician: Dr Tammi Klippel is a 73 y.o. male with a history of ulcerative colitis, HTN, HLD, obesity, OSA, and paroxysmal atrial fibrillation who presents for consultation in the [facility].  The patient was initially diagnosed with atrial fibrillation remotely in the setting of hip surgery and acute DVT and has had no known recurrrance until presenting to his PCP for his annual phsyical where he was noted to have an irregular pulse. An ECG revealed rate controlled afib. He was started on warfarin and referred to the AF Clinic. Patient reports that he is completely unaware of his arrhythmia. He states that his quality of life is "better than it has been in 20 years." He is very active by walking about one hour daily. He reports that he used to weigh >300 lbs. Patient does have OSA but is not on CPAP after recent weight loss. He does admit to alcohol use, about two drinks daily.  On follow up today, patient reports that he feels well. He is tolerating the medication without issues. His heart rate at home has been in the 80s-low 90s. He has cut back his alcohol use to just 2 drinks weekly. He remains very active.   Today, he denies symptoms of palpitations, chest pain, shortness of breath, orthopnea, PND, lower extremity edema, dizziness, presyncope, syncope, snoring, daytime somnolence, bleeding, or neurologic sequela. The patient is tolerating medications without difficulties and is otherwise without complaint today.    Atrial Fibrillation Risk Factors:  he does have symptoms or diagnosis of sleep apnea. he is not on CPAP therapy. he does not have a history of rheumatic fever. he does have a history of alcohol use. The patient does not have a history of early familial atrial fibrillation or other arrhythmias.  he has a BMI of Body mass  index is 35.28 kg/m.Marland Kitchen Filed Weights   10/23/18 0935  Weight: 121.3 kg    Family History  Problem Relation Age of Onset  . Lung cancer Brother      Atrial Fibrillation Management history:  Previous antiarrhythmic drugs: none Previous cardioversions: 6-7 years ago Previous ablations: none CHADS2VASC score: 2 Anticoagulation history: Xarelto (for DVT in the past), warfarin    Past Medical History:  Diagnosis Date  . BMI 40.0-44.9, adult (HCC)    Greater than 40 BMI  . 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 Xarelto - no longer taking  . Dysrhythmia    Atrial Fib x1 episode, cardioversion 4'16- NSR  . History of kidney stones    x1 -20 yrs ago  . Hypertension   . Sleep apnea    no cpap used, couldn't tolerate  . Ulcerative colitis ([place])    history of , some current issues at this time   Past Surgical History:  Procedure Laterality Date  . CARDIOVERSION     tx Paroxysmal Atrial Fibrillation- converted to NSR  . COLONOSCOPY W/ POLYPECTOMY    . FEMUR SURGERY Left    ORIF 10'05  . FLEXIBLE SIGMOIDOSCOPY N/A 11/01/2014   Procedure: FLEXIBLE SIGMOIDOSCOPY;  Surgeon: Garlan Fair, MD;  [facility]: [facility];  Service: Endoscopy;  Laterality: N/A;  . JOINT REPLACEMENT     BTHA    Current Outpatient Medications  Medication Sig Dispense Refill  . apixaban (ELIQUIS) 5 MG TABS tablet Take 1 tablet (5 mg total) by mouth 2 (  two) times daily. 60 tablet 3  . diltiazem (CARDIZEM CD) 120 MG 24 hr capsule Take 1 capsule (120 mg total) by mouth daily. 30 capsule 3  . losartan-hydrochlorothiazide (HYZAAR) 100-25 MG tablet TK 1 T PO QD    . amoxicillin (AMOXIL) 500 MG capsule      No current facility-administered medications for this encounter.     Allergies  Allergen Reactions  . Sulfa Antibiotics Other (See Comments)    Childhood allergy    Social History   Socioeconomic History  . Marital status: Married    Spouse name: Not on file  . Number of  children: Not on file  . Years of education: Not on file  . Highest education level: Not on file  Occupational History  . Not on file  Social Needs  . Financial resource strain: Not on file  . Food insecurity    Worry: Not on file    Inability: Not on file  . Transportation needs    Medical: Not on file    Non-medical: Not on file  Tobacco Use  . Smoking status: Never Smoker  . Smokeless tobacco: Never Used  Substance and Sexual Activity  . Alcohol use: Yes    Alcohol/week: 2.0 standard drinks    Types: 2 Cans of beer per week    Comment: daily  . Drug use: No  . Sexual activity: Not on file  Lifestyle  . Physical activity    Days per week: Not on file    Minutes per session: Not on file  . Stress: Not on file  Relationships  . Social Herbalist on phone: Not on file    Gets together: Not on file    Attends religious service: Not on file    Active member of club or organization: Not on file    Attends meetings of clubs or organizations: Not on file    Relationship status: Not on file  . Intimate partner violence    Fear of current or ex partner: Not on file    Emotionally abused: Not on file    Physically abused: Not on file    Forced sexual activity: Not on file  Other Topics Concern  . Not on file  Social History Narrative  . Not on file     ROS- All systems are reviewed and negative except as per the HPI above.  Physical Exam: Vitals:   10/23/18 0935  BP: 136/82  Pulse: (!) 102  Weight: 121.3 kg  Height: 6' 1"  (1.854 m)    GEN- The patient is well appearing obese male, alert and oriented x 3 today.   HEENT-head normocephalic, atraumatic, sclera clear, conjunctiva pink, hearing intact, trachea midline. Lungs- Clear to ausculation bilaterally, normal work of breathing Heart- irregular rate and rhythm, no murmurs, rubs or gallops  GI- soft, NT, ND, + BS Extremities- no clubbing, cyanosis, or edema MS- no significant deformity or atrophy  Skin- no rash or lesion Psych- euthymic mood, full affect Neuro- strength and sensation are intact   Wt Readings from Last 3 Encounters:  10/23/18 121.3 kg  10/14/18 120.9 kg  10/19/17 123.4 kg    EKG today demonstrates afib HR 102, LAFB, QRS 90, QTc 471  Epic records are reviewed at length today  Assessment and Plan:  1. Persistent atrial fibrillation Patient reports that he feel well since starting the medication. Continue diltiazem 120 mg daily Continue Eliquis 5 mg BID Will check echocardiogram once he is  back in SR. Will plan on DCCV after 3 weeks of uninterrupted anticoagulation. CBC/Bmet today. Ultimately, he may be a good candidate for rate control given his paucity of symptoms.   This patients CHA2DS2-VASc Score and unadjusted Ischemic Stroke Rate (% per year) is equal to 2.2 % stroke rate/year from a score of 2  Above score calculated as 1 point each if present [CHF, HTN, DM, Vascular=MI/PAD/Aortic Plaque, Age if 65-74, or Male] Above score calculated as 2 points each if present [Age > 75, or Stroke/TIA/TE]   2. Obesity Body mass index is 35.28 kg/m. Lifestyle modification was discussed and encouraged including regular physical activity and weight reduction. Doing very well with regular physical activity.   3. Obstructive sleep apnea Patient denies snoring since recent weight loss. Not currently on CPAP therapy.   4. HTN Stable, no changes today.    Follow up in the AF clinic one week post DCCV.   [facility] [address] [place], [place] [address] [phone] 10/23/2018 10:07 AM

## 2018-11-03 ENCOUNTER — Other Ambulatory Visit (HOSPITAL_COMMUNITY)
Admission: RE | Admit: 2018-11-03 | Discharge: 2018-11-03 | Disposition: A | Discharge status: Home / Self Care | Payer: No Typology Code available for payment source | Source: Ambulatory Visit | Attending: Cardiology | Admitting: Cardiology

## 2018-11-03 DIAGNOSIS — Z20828 Contact with and (suspected) exposure to other viral communicable diseases: Secondary | ICD-10-CM | POA: Insufficient documentation

## 2018-11-03 DIAGNOSIS — Z01812 Encounter for preprocedural laboratory examination: Secondary | ICD-10-CM | POA: Insufficient documentation

## 2018-11-04 LAB — NOVEL CORONAVIRUS, NAA (HOSP ORDER, SEND-OUT TO REF LAB; TAT 18-24 HRS): SARS-CoV-2, NAA: NOT DETECTED

## 2018-11-05 NOTE — Progress Notes (Signed)
Spoke with pt who states that he has remained in quarantine and is not experiencing and s/s of COVID 19 since his screening. All questions answered appropriately. Jobe Igo, RN

## 2018-11-05 NOTE — Anesthesia Preprocedure Evaluation (Addendum)
Anesthesia Evaluation  Patient identified by MRN, date of birth, ID band Patient awake    Reviewed: Allergy & Precautions, NPO status , Patient's Chart, lab work & pertinent test results  Airway Mallampati: II  TM Distance: >3 FB Neck ROM: Full    Dental no notable dental hx. (+) Teeth Intact   Pulmonary sleep apnea and Continuous Positive Airway Pressure Ventilation ,    Pulmonary exam normal breath sounds clear to auscultation       Cardiovascular hypertension, Pt. on medications + DVT  Normal cardiovascular exam+ dysrhythmias Atrial Fibrillation  Rhythm:Regular Rate:Normal  On eliquis   Neuro/Psych negative neurological ROS  negative psych ROS   GI/Hepatic Neg liver ROS, PUD, Hx of Ulcerative colitis   Endo/Other  negative endocrine ROS  Renal/GU negative Renal ROS     Musculoskeletal negative musculoskeletal ROS (+)   Abdominal (+) + obese,   Peds  Hematology   Anesthesia Other Findings   Reproductive/Obstetrics                            Anesthesia Physical Anesthesia Plan  ASA: III  Anesthesia Plan: General   Post-op Pain Management:    Induction: Intravenous  PONV Risk Score and Plan: 2 and Treatment may vary due to age or medical condition  Airway Management Planned: Natural Airway and Nasal Cannula  Additional Equipment: None  Intra-op Plan:   Post-operative Plan:   Informed Consent: I have reviewed the patients History and Physical, chart, labs and discussed the procedure including the risks, benefits and alternatives for the proposed anesthesia with the patient or authorized representative who has indicated his/her understanding and acceptance.     Dental advisory given  Plan Discussed with:   Anesthesia Plan Comments:        Anesthesia Quick Evaluation

## 2018-11-06 ENCOUNTER — Encounter (HOSPITAL_COMMUNITY)
Admission: RE | Disposition: A | Discharge status: Home / Self Care | Payer: Self-pay | Source: Home / Self Care | Attending: Cardiology

## 2018-11-06 ENCOUNTER — Ambulatory Visit (HOSPITAL_COMMUNITY): Payer: No Typology Code available for payment source | Admitting: Anesthesiology

## 2018-11-06 ENCOUNTER — Ambulatory Visit (HOSPITAL_COMMUNITY)
Admission: RE | Admit: 2018-11-06 | Discharge: 2018-11-06 | Disposition: A | Discharge status: Home / Self Care | Payer: No Typology Code available for payment source | Attending: Cardiology | Admitting: Cardiology

## 2018-11-06 ENCOUNTER — Encounter (HOSPITAL_COMMUNITY): Payer: Self-pay | Admitting: Certified Registered Nurse Anesthetist

## 2018-11-06 ENCOUNTER — Other Ambulatory Visit: Payer: Self-pay

## 2018-11-06 DIAGNOSIS — I10 Essential (primary) hypertension: Secondary | ICD-10-CM | POA: Diagnosis not present

## 2018-11-06 DIAGNOSIS — I4819 Other persistent atrial fibrillation: Secondary | ICD-10-CM | POA: Diagnosis not present

## 2018-11-06 DIAGNOSIS — Z79899 Other long term (current) drug therapy: Secondary | ICD-10-CM | POA: Insufficient documentation

## 2018-11-06 DIAGNOSIS — G4733 Obstructive sleep apnea (adult) (pediatric): Secondary | ICD-10-CM | POA: Diagnosis not present

## 2018-11-06 DIAGNOSIS — E669 Obesity, unspecified: Secondary | ICD-10-CM | POA: Diagnosis not present

## 2018-11-06 DIAGNOSIS — Z6835 Body mass index (BMI) 35.0-35.9, adult: Secondary | ICD-10-CM | POA: Diagnosis not present

## 2018-11-06 DIAGNOSIS — E785 Hyperlipidemia, unspecified: Secondary | ICD-10-CM | POA: Diagnosis not present

## 2018-11-06 DIAGNOSIS — Z882 Allergy status to sulfonamides status: Secondary | ICD-10-CM | POA: Diagnosis not present

## 2018-11-06 DIAGNOSIS — Z86718 Personal history of other venous thrombosis and embolism: Secondary | ICD-10-CM | POA: Insufficient documentation

## 2018-11-06 DIAGNOSIS — Z7901 Long term (current) use of anticoagulants: Secondary | ICD-10-CM | POA: Insufficient documentation

## 2018-11-06 HISTORY — PX: CARDIOVERSION: SHX1299

## 2018-11-06 SURGERY — CARDIOVERSION
Anesthesia: General

## 2018-11-06 MED ORDER — SODIUM CHLORIDE 0.9 % IV SOLN
INTRAVENOUS | Status: AC | PRN
  Administered 2018-11-06: 500 mL via INTRAMUSCULAR

## 2018-11-06 MED ORDER — PROPOFOL 10 MG/ML IV BOLUS
INTRAVENOUS | Status: DC | PRN
  Administered 2018-11-06: 100 mg via INTRAVENOUS

## 2018-11-06 MED ORDER — LIDOCAINE 2% (20 MG/ML) 5 ML SYRINGE
INTRAMUSCULAR | Status: DC | PRN
  Administered 2018-11-06: 100 mg via INTRAVENOUS

## 2018-11-06 NOTE — Anesthesia Postprocedure Evaluation (Signed)
Anesthesia Post Note  Patient: Steven Blair  Procedure(s) Performed: CARDIOVERSION (N/A )     Patient location during evaluation: Endoscopy Anesthesia Type: General Level of consciousness: awake and alert Pain management: pain level controlled Vital Signs Assessment: post-procedure vital signs reviewed and stable Respiratory status: spontaneous breathing, nonlabored ventilation, respiratory function stable and patient connected to nasal cannula oxygen Cardiovascular status: blood pressure returned to baseline and stable Postop Assessment: no apparent nausea or vomiting Anesthetic complications: no    Last Vitals:  Vitals:   11/06/18 0837 11/06/18 0847  BP: (!) 104/54 (!) 115/58  Pulse: 71 (!) 52  Resp: 16 16  Temp:    SpO2: 98% 97%    Last Pain:  Vitals:   11/06/18 0742  TempSrc: Oral  PainSc: 0-No pain                 Barnet Glasgow

## 2018-11-06 NOTE — Transfer of Care (Signed)
Immediate Anesthesia Transfer of Care Note  Patient: Steven Blair  Procedure(s) Performed: CARDIOVERSION (N/A )  Patient Location: Endoscopy Unit  Anesthesia Type:General  Level of Consciousness: awake, alert , oriented and patient cooperative  Airway & Oxygen Therapy: Patient Spontanous Breathing  Post-op Assessment: Report given to RN and Post -op Vital signs reviewed and stable  Post vital signs: Reviewed and stable  Last Vitals:  Vitals Value Taken Time  BP    Temp    Pulse    Resp    SpO2      Last Pain:  Vitals:   11/06/18 0742  TempSrc: Oral  PainSc: 0-No pain         Complications: No apparent anesthesia complications

## 2018-11-06 NOTE — Anesthesia Procedure Notes (Signed)
Procedure Name: General with mask airway Date/Time: 11/06/2018 8:30 AM Performed by: Lowella Dell, CRNA Pre-anesthesia Checklist: Patient identified, Emergency Drugs available, Suction available, Patient being monitored and Timeout performed Patient Re-evaluated:Patient Re-evaluated prior to induction Oxygen Delivery Method: Ambu bag Preoxygenation: Pre-oxygenation with 100% oxygen Induction Type: IV induction Ventilation: Mask ventilation without difficulty Placement Confirmation: positive ETCO2 Dental Injury: Teeth and Oropharynx as per pre-operative assessment

## 2018-11-06 NOTE — Interval H&P Note (Signed)
History and Physical Interval Note:  11/06/2018 8:15 AM  Steven Blair  has presented today for surgery, with the diagnosis of AFIB.  The various methods of treatment have been discussed with the patient and family. After consideration of risks, benefits and other options for treatment, the patient has consented to  Procedure(s): CARDIOVERSION (N/A) as a surgical intervention.  The patient's history has been reviewed, patient examined, no change in status, stable for surgery.  I have reviewed the patient's chart and labs.  Questions were answered to the patient's satisfaction.     Donato Heinz

## 2018-11-06 NOTE — CV Procedure (Signed)
Procedure:   DCCV  Indication:  Symptomatic atrial fibrillation  Procedure Note:  The patient signed informed consent.  They have had had therapeutic anticoagulation with Eliquis greater than 3 weeks.  Anesthesia was administered by Dr. Valma Cava.  Adequate airway was maintained throughout and vital followed per protocol.  They were cardioverted x 1 with 200J of biphasic synchronized energy.  They converted to NSR.  There were no apparent complications.  The patient had normal neuro status and respiratory status post procedure with vitals stable as recorded elsewhere.    Follow up:  They will continue on current medical therapy and follow up with cardiology as scheduled.  Oswaldo Milian, MD 11/06/2018 8:52 AM

## 2018-11-08 ENCOUNTER — Encounter (HOSPITAL_COMMUNITY): Payer: Self-pay | Admitting: Cardiology

## 2018-11-13 ENCOUNTER — Ambulatory Visit (HOSPITAL_COMMUNITY): Payer: No Typology Code available for payment source | Admitting: Physician Assistant

## 2018-11-23 ENCOUNTER — Ambulatory Visit (HOSPITAL_COMMUNITY)
Admission: RE | Admit: 2018-11-23 | Discharge: 2018-11-23 | Disposition: A | Discharge status: Home / Self Care | Payer: No Typology Code available for payment source | Source: Ambulatory Visit | Attending: Physician Assistant | Admitting: Physician Assistant

## 2018-11-23 ENCOUNTER — Encounter (HOSPITAL_COMMUNITY): Payer: Self-pay | Admitting: Physician Assistant

## 2018-11-23 ENCOUNTER — Other Ambulatory Visit: Payer: Self-pay

## 2018-11-23 VITALS — BP 140/88 | HR 102 | Ht 73.0 in | Wt 270.4 lb

## 2018-11-23 DIAGNOSIS — I10 Essential (primary) hypertension: Secondary | ICD-10-CM | POA: Diagnosis not present

## 2018-11-23 DIAGNOSIS — E669 Obesity, unspecified: Secondary | ICD-10-CM | POA: Insufficient documentation

## 2018-11-23 DIAGNOSIS — Z86718 Personal history of other venous thrombosis and embolism: Secondary | ICD-10-CM | POA: Insufficient documentation

## 2018-11-23 DIAGNOSIS — D6869 Other thrombophilia: Secondary | ICD-10-CM | POA: Diagnosis not present

## 2018-11-23 DIAGNOSIS — Z801 Family history of malignant neoplasm of trachea, bronchus and lung: Secondary | ICD-10-CM | POA: Diagnosis not present

## 2018-11-23 DIAGNOSIS — Z882 Allergy status to sulfonamides status: Secondary | ICD-10-CM | POA: Insufficient documentation

## 2018-11-23 DIAGNOSIS — Z79899 Other long term (current) drug therapy: Secondary | ICD-10-CM | POA: Diagnosis not present

## 2018-11-23 DIAGNOSIS — Z7901 Long term (current) use of anticoagulants: Secondary | ICD-10-CM | POA: Insufficient documentation

## 2018-11-23 DIAGNOSIS — G4733 Obstructive sleep apnea (adult) (pediatric): Secondary | ICD-10-CM | POA: Diagnosis not present

## 2018-11-23 DIAGNOSIS — I4819 Other persistent atrial fibrillation: Secondary | ICD-10-CM | POA: Diagnosis present

## 2018-11-23 DIAGNOSIS — R9431 Abnormal electrocardiogram [ECG] [EKG]: Secondary | ICD-10-CM | POA: Diagnosis not present

## 2018-11-23 DIAGNOSIS — Z6835 Body mass index (BMI) 35.0-35.9, adult: Secondary | ICD-10-CM | POA: Diagnosis not present

## 2018-11-23 NOTE — Progress Notes (Signed)
Primary Care Physician: Josetta Huddle, MD Primary Cardiologist: none Primary Electrophysiologist: none Referring Physician: Dr Tammi Klippel is a 73 y.o. male with a history of ulcerative colitis, HTN, HLD, obesity, OSA, and paroxysmal atrial fibrillation who presents for consultation in the [facility].  The patient was initially diagnosed with atrial fibrillation remotely in the setting of hip surgery and acute DVT and has had no known recurrrance until presenting to his PCP for his annual phsyical where he was noted to have an irregular pulse. An ECG revealed rate controlled afib. He was started on warfarin and referred to the AF Clinic. Patient reports that he is completely unaware of his arrhythmia. He states that his quality of life is "better than it has been in 20 years." He is very active by walking about one hour daily. He reports that he used to weigh >300 lbs. Patient does have OSA but is not on CPAP after recent weight loss. He does admit to alcohol use, about two drinks daily. He is on Eliquis for CHADS2VASC score of 2.  On follow up today, patient is s/p DCCV 11/06/18. Patient reports that he felt no different in SR. He is very surprised to hear he is back in afib today. He remains asymptomatic. There were no specific triggers that the patient could identify. He checks his BP and heart rate daily which have been within the normal range.  Today, he denies symptoms of palpitations, chest pain, shortness of breath, orthopnea, PND, lower extremity edema, dizziness, presyncope, syncope, snoring, daytime somnolence, bleeding, or neurologic sequela. The patient is tolerating medications without difficulties and is otherwise without complaint today.    Atrial Fibrillation Risk Factors:  he does have symptoms or diagnosis of sleep apnea. he is not on CPAP therapy. he does not have a history of rheumatic fever. he does have a history of alcohol use.  The patient does not have a history of early familial atrial fibrillation or other arrhythmias.  he has a BMI of Body mass index is 35.67 kg/m.Marland Kitchen Filed Weights   11/23/18 0838  Weight: 122.7 kg    Family History  Problem Relation Age of Onset  . Lung cancer Brother      Atrial Fibrillation Management history:  Previous antiarrhythmic drugs: none Previous cardioversions: 6-7 years ago, 11/06/18 Previous ablations: none CHADS2VASC score: 2 Anticoagulation history: Xarelto (for DVT in the past), warfarin, Eliquis   Past Medical History:  Diagnosis Date  . BMI 40.0-44.9, adult (HCC)    Greater than 40 BMI  . 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 Xarelto - no longer taking  . Dysrhythmia    Atrial Fib x1 episode, cardioversion 4'16- NSR  . History of kidney stones    x1 -20 yrs ago  . Hypertension   . Sleep apnea    no cpap used, couldn't tolerate  . Ulcerative colitis ([place])    history of , some current issues at this time   Past Surgical History:  Procedure Laterality Date  . CARDIOVERSION     tx Paroxysmal Atrial Fibrillation- converted to NSR  . CARDIOVERSION N/A 11/06/2018   Procedure: CARDIOVERSION;  Surgeon: Donato Heinz, MD;  Location: [facility] ENDOSCOPY;  Service: Endoscopy;  Laterality: N/A;  . COLONOSCOPY W/ POLYPECTOMY    . FEMUR SURGERY Left    ORIF 10'05  . FLEXIBLE SIGMOIDOSCOPY N/A 11/01/2014   Procedure: FLEXIBLE SIGMOIDOSCOPY;  Surgeon: Garlan Fair,  MD;  Location: [facility] ENDOSCOPY;  Service: Endoscopy;  Laterality: N/A;  . JOINT REPLACEMENT     BTHA    Current Outpatient Medications  Medication Sig Dispense Refill  . acetaminophen (TYLENOL) 500 MG tablet Take 1,000 mg by mouth every 6 (six) hours as needed for mild pain or moderate pain.    Marland Kitchen apixaban (ELIQUIS) 5 MG TABS tablet Take 1 tablet (5 mg total) by mouth 2 (two) times daily. 60 tablet 3  . Cholecalciferol (VITAMIN D3) 50 MCG (2000 UT) capsule Take 2,000 Units by  mouth daily.    Marland Kitchen diltiazem (CARDIZEM CD) 120 MG 24 hr capsule Take 1 capsule (120 mg total) by mouth daily. 30 capsule 3  . losartan-hydrochlorothiazide (HYZAAR) 100-25 MG tablet Take 1 tablet by mouth daily.      No current facility-administered medications for this encounter.     Allergies  Allergen Reactions  . Sulfa Antibiotics Other (See Comments)    Childhood allergy    Social History   Socioeconomic History  . Marital status: Married    Spouse name: Not on file  . Number of children: Not on file  . Years of education: Not on file  . Highest education level: Not on file  Occupational History  . Not on file  Social Needs  . Financial resource strain: Not on file  . Food insecurity    Worry: Not on file    Inability: Not on file  . Transportation needs    Medical: Not on file    Non-medical: Not on file  Tobacco Use  . Smoking status: Never Smoker  . Smokeless tobacco: Never Used  Substance and Sexual Activity  . Alcohol use: Yes    Alcohol/week: 3.0 standard drinks    Types: 2 Cans of beer, 1 Glasses of wine per week    Comment: daily  . Drug use: No  . Sexual activity: Not on file  Lifestyle  . Physical activity    Days per week: Not on file    Minutes per session: Not on file  . Stress: Not on file  Relationships  . Social Herbalist on phone: Not on file    Gets together: Not on file    Attends religious service: Not on file    Active member of club or organization: Not on file    Attends meetings of clubs or organizations: Not on file    Relationship status: Not on file  . Intimate partner violence    Fear of current or ex partner: Not on file    Emotionally abused: Not on file    Physically abused: Not on file    Forced sexual activity: Not on file  Other Topics Concern  . Not on file  Social History Narrative  . Not on file     ROS- All systems are reviewed and negative except as per the HPI above.  Physical Exam: Vitals:    11/23/18 0838  BP: 140/88  Pulse: (!) 102  Weight: 122.7 kg  Height: 6' 1"  (1.854 m)    GEN- The patient is well appearing obese male, alert and oriented x 3 today.   HEENT-head normocephalic, atraumatic, sclera clear, conjunctiva pink, hearing intact, trachea midline. Lungs- Clear to ausculation bilaterally, normal work of breathing Heart- irregular rate and rhythm, no murmurs, rubs or gallops  GI- soft, NT, ND, + BS Extremities- no clubbing, cyanosis, or edema MS- no significant deformity or atrophy Skin- no rash or lesion  Psych- euthymic mood, full affect Neuro- strength and sensation are intact   Wt Readings from Last 3 Encounters:  11/23/18 122.7 kg  11/06/18 121 kg  10/23/18 121.3 kg    EKG today demonstrates afib HR 102, LAFB, QRS 92, QTc 477  Epic records are reviewed at length today  Assessment and Plan:  1. Persistent atrial fibrillation S/p DCCV on 11/06/18. Unfortunately, has had early return of afib. We had a discussion about therapeutic options including AAD +/- DCCV vs rate control. At this time, given his age and paucity of symptoms, he would like to pursue rate control.  Continue diltiazem 120 mg daily. Patient will continue to check heart rate at home, may titrate if rates elevated. Continue Eliquis 5 mg BID Check echocardiogram.  This patients CHA2DS2-VASc Score and unadjusted Ischemic Stroke Rate (% per year) is equal to 2.2 % stroke rate/year from a score of 2  Above score calculated as 1 point each if present [CHF, HTN, DM, Vascular=MI/PAD/Aortic Plaque, Age if 65-74, or Male] Above score calculated as 2 points each if present [Age > 75, or Stroke/TIA/TE]   2. Obesity Body mass index is 35.67 kg/m. Lifestyle modification was discussed and encouraged including regular physical activity and weight reduction. Patient remains very active by walking an hour each day.  3. Obstructive sleep apnea The importance of adequate treatment of sleep apnea  was discussed today in order to improve our ability to maintain sinus rhythm long term. Patient no longer requiring CPAP after significant weight loss.  4. HTN Stable, no changes today.   Follow up in the AF clinic in one month.   [facility] [address] [place], [place] [address] [phone] 11/23/2018 9:02 AM

## 2018-12-03 ENCOUNTER — Ambulatory Visit (HOSPITAL_COMMUNITY)
Admission: RE | Admit: 2018-12-03 | Discharge: 2018-12-03 | Disposition: A | Discharge status: Home / Self Care | Payer: No Typology Code available for payment source | Source: Ambulatory Visit | Attending: Physician Assistant | Admitting: Physician Assistant

## 2018-12-03 ENCOUNTER — Other Ambulatory Visit: Payer: Self-pay

## 2018-12-03 DIAGNOSIS — I4819 Other persistent atrial fibrillation: Secondary | ICD-10-CM | POA: Diagnosis present

## 2018-12-03 DIAGNOSIS — I517 Cardiomegaly: Secondary | ICD-10-CM | POA: Diagnosis not present

## 2018-12-03 DIAGNOSIS — I10 Essential (primary) hypertension: Secondary | ICD-10-CM | POA: Insufficient documentation

## 2018-12-03 NOTE — Progress Notes (Signed)
  Echocardiogram 2D Echocardiogram has been performed.  Jennette Dubin 12/03/2018, 9:10 AM

## 2018-12-04 ENCOUNTER — Encounter (HOSPITAL_COMMUNITY): Payer: Self-pay | Admitting: *Deleted

## 2018-12-07 ENCOUNTER — Telehealth (HOSPITAL_COMMUNITY): Payer: Self-pay | Admitting: *Deleted

## 2018-12-07 NOTE — Telephone Encounter (Signed)
Faxed recommendation to [facility].

## 2018-12-07 NOTE — Telephone Encounter (Signed)
Patient is >4 weeks post DCCV. Patient may hold Eliquis 2 days prior to procedure.

## 2018-12-07 NOTE — Telephone Encounter (Signed)
Patient needs to have colonoscopy but [facility] needs documentation that patient can stop Eliquis prior to procedure. Please indicate how many days in advance of procedure patient can stop Eliquis.

## 2018-12-23 ENCOUNTER — Encounter (HOSPITAL_COMMUNITY): Payer: Self-pay | Admitting: Physician Assistant

## 2018-12-23 ENCOUNTER — Other Ambulatory Visit: Payer: Self-pay

## 2018-12-23 ENCOUNTER — Ambulatory Visit (HOSPITAL_COMMUNITY)
Admission: RE | Admit: 2018-12-23 | Discharge: 2018-12-23 | Disposition: A | Discharge status: Home / Self Care | Payer: No Typology Code available for payment source | Source: Ambulatory Visit | Attending: Nurse Practitioner | Admitting: Nurse Practitioner

## 2018-12-23 VITALS — BP 126/76 | HR 92 | Ht 73.0 in | Wt 265.2 lb

## 2018-12-23 DIAGNOSIS — E785 Hyperlipidemia, unspecified: Secondary | ICD-10-CM | POA: Insufficient documentation

## 2018-12-23 DIAGNOSIS — Z6834 Body mass index (BMI) 34.0-34.9, adult: Secondary | ICD-10-CM | POA: Insufficient documentation

## 2018-12-23 DIAGNOSIS — Z7901 Long term (current) use of anticoagulants: Secondary | ICD-10-CM | POA: Insufficient documentation

## 2018-12-23 DIAGNOSIS — D6869 Other thrombophilia: Secondary | ICD-10-CM | POA: Diagnosis not present

## 2018-12-23 DIAGNOSIS — E669 Obesity, unspecified: Secondary | ICD-10-CM | POA: Insufficient documentation

## 2018-12-23 DIAGNOSIS — I10 Essential (primary) hypertension: Secondary | ICD-10-CM | POA: Insufficient documentation

## 2018-12-23 DIAGNOSIS — Z79899 Other long term (current) drug therapy: Secondary | ICD-10-CM | POA: Insufficient documentation

## 2018-12-23 DIAGNOSIS — I4819 Other persistent atrial fibrillation: Secondary | ICD-10-CM | POA: Insufficient documentation

## 2018-12-23 DIAGNOSIS — G4733 Obstructive sleep apnea (adult) (pediatric): Secondary | ICD-10-CM | POA: Diagnosis not present

## 2018-12-23 DIAGNOSIS — I4821 Permanent atrial fibrillation: Secondary | ICD-10-CM | POA: Diagnosis not present

## 2018-12-23 NOTE — Progress Notes (Signed)
Primary Care Physician: Josetta Huddle, MD Primary Cardiologist: none Primary Electrophysiologist: none Referring Physician: Dr Tammi Klippel is a 73 y.o. male with a history of ulcerative colitis, HTN, HLD, obesity, OSA, and paroxysmal atrial fibrillation who presents for consultation in the [facility].  The patient was initially diagnosed with atrial fibrillation remotely in the setting of hip surgery and acute DVT and has had no known recurrrance until presenting to his PCP for his annual phsyical where he was noted to have an irregular pulse. An ECG revealed rate controlled afib. He was started on warfarin and referred to the AF Clinic. Patient reports that he is completely unaware of his arrhythmia. He states that his quality of life is "better than it has been in 20 years." He is very active by walking about one hour daily. He reports that he used to weigh >300 lbs. Patient does have OSA but is not on CPAP after recent weight loss. He does admit to alcohol use, about two drinks daily. He is on Eliquis for CHADS2VASC score of 2.  On follow up today, patient reports that he has done very well since his last visit. He continues to be very active walking >1hr daily. His weight has steadily decreased. He checks his heart rate and blood pressure regularly and has shown excellent control.   Today, he denies symptoms of palpitations, chest pain, shortness of breath, orthopnea, PND, lower extremity edema, dizziness, presyncope, syncope, snoring, daytime somnolence, bleeding, or neurologic sequela. The patient is tolerating medications without difficulties and is otherwise without complaint today.    Atrial Fibrillation Risk Factors:  he does have symptoms or diagnosis of sleep apnea. he is not on CPAP therapy. he does not have a history of rheumatic fever. he does have a history of alcohol use. The patient does not have a history of early familial atrial  fibrillation or other arrhythmias.  he has a BMI of Body mass index is 34.99 kg/m.Marland Kitchen Filed Weights   12/23/18 0834  Weight: 120.3 kg    Family History  Problem Relation Age of Onset  . Lung cancer Brother      Atrial Fibrillation Management history:  Previous antiarrhythmic drugs: none Previous cardioversions: 6-7 years ago, 11/06/18 Previous ablations: none CHADS2VASC score: 2 Anticoagulation history: Xarelto (for DVT in the past), warfarin, Eliquis   Past Medical History:  Diagnosis Date  . BMI 40.0-44.9, adult (HCC)    Greater than 40 BMI  . 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 Xarelto - no longer taking  . Dysrhythmia    Atrial Fib x1 episode, cardioversion 4'16- NSR  . History of kidney stones    x1 -20 yrs ago  . Hypertension   . Sleep apnea    no cpap used, couldn't tolerate  . Ulcerative colitis ([place])    history of , some current issues at this time   Past Surgical History:  Procedure Laterality Date  . CARDIOVERSION     tx Paroxysmal Atrial Fibrillation- converted to NSR  . CARDIOVERSION N/A 11/06/2018   Procedure: CARDIOVERSION;  Surgeon: Donato Heinz, MD;  Location: [facility] ENDOSCOPY;  Service: Endoscopy;  Laterality: N/A;  . COLONOSCOPY W/ POLYPECTOMY    . FEMUR SURGERY Left    ORIF 10'05  . FLEXIBLE SIGMOIDOSCOPY N/A 11/01/2014   Procedure: FLEXIBLE SIGMOIDOSCOPY;  Surgeon: Garlan Fair, MD;  Location: [facility] ENDOSCOPY;  Service: Endoscopy;  Laterality: N/A;  .  JOINT REPLACEMENT     BTHA    Current Outpatient Medications  Medication Sig Dispense Refill  . acetaminophen (TYLENOL) 500 MG tablet Take 1,000 mg by mouth every 6 (six) hours as needed for mild pain or moderate pain.    Marland Kitchen apixaban (ELIQUIS) 5 MG TABS tablet Take 1 tablet (5 mg total) by mouth 2 (two) times daily. 60 tablet 3  . Cholecalciferol (VITAMIN D3) 50 MCG (2000 UT) capsule Take 2,000 Units by mouth daily.    Marland Kitchen diltiazem (CARDIZEM CD) 120 MG 24 hr  capsule Take 1 capsule (120 mg total) by mouth daily. 30 capsule 3  . losartan-hydrochlorothiazide (HYZAAR) 100-25 MG tablet Take 1 tablet by mouth daily.      No current facility-administered medications for this encounter.     Allergies  Allergen Reactions  . Sulfa Antibiotics Other (See Comments)    Childhood allergy    Social History   Socioeconomic History  . Marital status: Married    Spouse name: Not on file  . Number of children: Not on file  . Years of education: Not on file  . Highest education level: Not on file  Occupational History  . Not on file  Social Needs  . Financial resource strain: Not on file  . Food insecurity    Worry: Not on file    Inability: Not on file  . Transportation needs    Medical: Not on file    Non-medical: Not on file  Tobacco Use  . Smoking status: Never Smoker  . Smokeless tobacco: Never Used  Substance and Sexual Activity  . Alcohol use: Yes    Alcohol/week: 3.0 standard drinks    Types: 1 Glasses of wine, 2 Cans of beer per week    Comment: weekend  . Drug use: No  . Sexual activity: Not on file  Lifestyle  . Physical activity    Days per week: Not on file    Minutes per session: Not on file  . Stress: Not on file  Relationships  . Social Herbalist on phone: Not on file    Gets together: Not on file    Attends religious service: Not on file    Active member of club or organization: Not on file    Attends meetings of clubs or organizations: Not on file    Relationship status: Not on file  . Intimate partner violence    Fear of current or ex partner: Not on file    Emotionally abused: Not on file    Physically abused: Not on file    Forced sexual activity: Not on file  Other Topics Concern  . Not on file  Social History Narrative  . Not on file     ROS- All systems are reviewed and negative except as per the HPI above.  Physical Exam: Vitals:   12/23/18 0834  BP: 126/76  Pulse: 92  Weight: 120.3  kg  Height: 6' 1"  (1.854 m)    GEN- The patient is well appearing obese male, alert and oriented x 3 today.   HEENT-head normocephalic, atraumatic, sclera clear, conjunctiva pink, hearing intact, trachea midline. Lungs- Clear to ausculation bilaterally, normal work of breathing Heart- irregular rate and rhythm, no murmurs, rubs or gallops  GI- soft, NT, ND, + BS Extremities- no clubbing, cyanosis, or edema MS- no significant deformity or atrophy Skin- no rash or lesion Psych- euthymic mood, full affect Neuro- strength and sensation are intact   Wt  Readings from Last 3 Encounters:  12/23/18 120.3 kg  11/23/18 122.7 kg  11/06/18 121 kg    EKG today demonstrates afib HR 92, LAFB, QRS 90, QTc 427  Echo 12/03/18  1. Left ventricular ejection fraction, by visual estimation, is 45 to 50%. The left ventricle has mildly decreased function. There is mildly increased left ventricular hypertrophy.  2. Left ventricular diastolic parameters are indeterminate.  3. Global right ventricle has normal systolic function.The right ventricular size is mildly enlarged. No increase in right ventricular wall thickness.  4. Left atrial size was severely dilated.  5. Right atrial size was mildly dilated.  6. The mitral valve is normal in structure. No evidence of mitral valve regurgitation.  7. The tricuspid valve is normal in structure. Tricuspid valve regurgitation is not demonstrated.  8. The aortic valve is tricuspid. Aortic valve regurgitation is not visualized. No evidence of aortic valve sclerosis or stenosis.  9. The pulmonic valve was not well visualized. Pulmonic valve regurgitation is not visualized. 10. There is mild dilatation of the aortic root measuring 40 mm. 11. The inferior vena cava is normal in size with greater than 50% respiratory variability, suggesting right atrial pressure of 3 mmHg.  Epic records are reviewed at length today  Assessment and Plan:  1. Persistent atrial  fibrillation S/p DCCV on 11/06/18. Unfortunately, has had early return of afib. We discussed rate control again today. At this time, given his age and paucity of symptoms, he would like to continue to pursue rate control.  Continue diltiazem 120 mg daily.  Continue Eliquis 5 mg BID  This patients CHA2DS2-VASc Score and unadjusted Ischemic Stroke Rate (% per year) is equal to 2.2 % stroke rate/year from a score of 2  Above score calculated as 1 point each if present [CHF, HTN, DM, Vascular=MI/PAD/Aortic Plaque, Age if 65-74, or Male] Above score calculated as 2 points each if present [Age > 75, or Stroke/TIA/TE]   2. Obesity Body mass index is 34.99 kg/m. Lifestyle modification was discussed and encouraged including regular physical activity and weight reduction. He is doing very well with this.  3. Obstructive sleep apnea The importance of adequate treatment of sleep apnea was discussed today in order to improve our ability to maintain sinus rhythm long term. Patient no longer requiring CPAP after significant weight loss.  4. HTN Stable, no changes today.   Follow up in the AF clinic as needed.   [facility] [address] [place], [place] [address] [phone] 12/23/2018 8:49 AM

## 2019-01-27 ENCOUNTER — Other Ambulatory Visit (HOSPITAL_COMMUNITY): Payer: Self-pay | Admitting: Physician Assistant

## 2019-02-05 ENCOUNTER — Other Ambulatory Visit (HOSPITAL_COMMUNITY): Payer: Self-pay | Admitting: Physician Assistant

## 2019-02-22 DIAGNOSIS — S72101A Unspecified trochanteric fracture of right femur, initial encounter for closed fracture: Secondary | ICD-10-CM

## 2019-02-22 HISTORY — DX: Unspecified trochanteric fracture of right femur, initial encounter for closed fracture: S72.101A

## 2019-03-01 ENCOUNTER — Emergency Department (HOSPITAL_COMMUNITY): Payer: No Typology Code available for payment source

## 2019-03-01 ENCOUNTER — Other Ambulatory Visit: Payer: Self-pay

## 2019-03-01 ENCOUNTER — Inpatient Hospital Stay (HOSPITAL_COMMUNITY)
Admission: EM | Admit: 2019-03-01 | Discharge: 2019-03-11 | DRG: 536 | Disposition: A | Discharge status: Skilled Nursing Facility | Payer: No Typology Code available for payment source | Attending: Internal Medicine | Admitting: Internal Medicine

## 2019-03-01 ENCOUNTER — Encounter (HOSPITAL_COMMUNITY): Payer: Self-pay | Admitting: Emergency Medicine

## 2019-03-01 DIAGNOSIS — M1612 Unilateral primary osteoarthritis, left hip: Secondary | ICD-10-CM | POA: Diagnosis present

## 2019-03-01 DIAGNOSIS — I472 Ventricular tachycardia: Secondary | ICD-10-CM | POA: Diagnosis present

## 2019-03-01 DIAGNOSIS — M25551 Pain in right hip: Secondary | ICD-10-CM

## 2019-03-01 DIAGNOSIS — Z86718 Personal history of other venous thrombosis and embolism: Secondary | ICD-10-CM

## 2019-03-01 DIAGNOSIS — N179 Acute kidney failure, unspecified: Secondary | ICD-10-CM | POA: Diagnosis not present

## 2019-03-01 DIAGNOSIS — G4733 Obstructive sleep apnea (adult) (pediatric): Secondary | ICD-10-CM | POA: Diagnosis present

## 2019-03-01 DIAGNOSIS — W010XXA Fall on same level from slipping, tripping and stumbling without subsequent striking against object, initial encounter: Secondary | ICD-10-CM

## 2019-03-01 DIAGNOSIS — R319 Hematuria, unspecified: Secondary | ICD-10-CM | POA: Diagnosis present

## 2019-03-01 DIAGNOSIS — Z6835 Body mass index (BMI) 35.0-35.9, adult: Secondary | ICD-10-CM

## 2019-03-01 DIAGNOSIS — Z79899 Other long term (current) drug therapy: Secondary | ICD-10-CM

## 2019-03-01 DIAGNOSIS — I4819 Other persistent atrial fibrillation: Secondary | ICD-10-CM | POA: Diagnosis present

## 2019-03-01 DIAGNOSIS — W19XXXA Unspecified fall, initial encounter: Secondary | ICD-10-CM

## 2019-03-01 DIAGNOSIS — D7589 Other specified diseases of blood and blood-forming organs: Secondary | ICD-10-CM | POA: Diagnosis not present

## 2019-03-01 DIAGNOSIS — K8020 Calculus of gallbladder without cholecystitis without obstruction: Secondary | ICD-10-CM | POA: Diagnosis present

## 2019-03-01 DIAGNOSIS — D696 Thrombocytopenia, unspecified: Secondary | ICD-10-CM | POA: Diagnosis present

## 2019-03-01 DIAGNOSIS — G4730 Sleep apnea, unspecified: Secondary | ICD-10-CM | POA: Diagnosis present

## 2019-03-01 DIAGNOSIS — E669 Obesity, unspecified: Secondary | ICD-10-CM | POA: Diagnosis present

## 2019-03-01 DIAGNOSIS — S72121A Displaced fracture of lesser trochanter of right femur, initial encounter for closed fracture: Principal | ICD-10-CM | POA: Diagnosis present

## 2019-03-01 DIAGNOSIS — M25552 Pain in left hip: Secondary | ICD-10-CM | POA: Diagnosis present

## 2019-03-01 DIAGNOSIS — I10 Essential (primary) hypertension: Secondary | ICD-10-CM | POA: Diagnosis present

## 2019-03-01 DIAGNOSIS — S72499A Other fracture of lower end of unspecified femur, initial encounter for closed fracture: Secondary | ICD-10-CM | POA: Diagnosis present

## 2019-03-01 DIAGNOSIS — Z7901 Long term (current) use of anticoagulants: Secondary | ICD-10-CM

## 2019-03-01 DIAGNOSIS — M62838 Other muscle spasm: Secondary | ICD-10-CM | POA: Diagnosis present

## 2019-03-01 DIAGNOSIS — K5190 Ulcerative colitis, unspecified, without complications: Secondary | ICD-10-CM | POA: Diagnosis present

## 2019-03-01 DIAGNOSIS — I700 Atherosclerosis of aorta: Secondary | ICD-10-CM | POA: Diagnosis present

## 2019-03-01 DIAGNOSIS — N200 Calculus of kidney: Secondary | ICD-10-CM

## 2019-03-01 DIAGNOSIS — I4891 Unspecified atrial fibrillation: Secondary | ICD-10-CM | POA: Diagnosis present

## 2019-03-01 DIAGNOSIS — Z20822 Contact with and (suspected) exposure to covid-19: Secondary | ICD-10-CM | POA: Diagnosis present

## 2019-03-01 DIAGNOSIS — Y92008 Other place in unspecified non-institutional (private) residence as the place of occurrence of the external cause: Secondary | ICD-10-CM

## 2019-03-01 DIAGNOSIS — Z96643 Presence of artificial hip joint, bilateral: Secondary | ICD-10-CM | POA: Diagnosis present

## 2019-03-01 DIAGNOSIS — W000XXA Fall on same level due to ice and snow, initial encounter: Secondary | ICD-10-CM | POA: Diagnosis present

## 2019-03-01 LAB — URINALYSIS, ROUTINE W REFLEX MICROSCOPIC
Bilirubin Urine: NEGATIVE
Glucose, UA: NEGATIVE mg/dL
Ketones, ur: NEGATIVE mg/dL
Leukocytes,Ua: NEGATIVE
Nitrite: NEGATIVE
Protein, ur: NEGATIVE mg/dL
RBC / HPF: >50 RBC/hpf — ABNORMAL HIGH (ref 0–5)
Specific Gravity, Urine: 1.014 (ref 1.005–1.030)
pH: 7 (ref 5.0–8.0)

## 2019-03-01 LAB — BASIC METABOLIC PANEL
Anion gap: 12 (ref 5–15)
BUN: 15 mg/dL (ref 8–23)
CO2: 25 mmol/L (ref 22–32)
Calcium: 9.1 mg/dL (ref 8.9–10.3)
Chloride: 103 mmol/L (ref 98–111)
Creatinine, Ser: 1.01 mg/dL (ref 0.61–1.24)
GFR calc Af Amer: >60 mL/min (ref >60)
GFR calc non Af Amer: >60 mL/min (ref >60)
Glucose, Bld: 126 mg/dL — ABNORMAL HIGH (ref 70–99)
Potassium: 4.2 mmol/L (ref 3.5–5.1)
Sodium: 140 mmol/L (ref 135–145)

## 2019-03-01 LAB — RESPIRATORY PANEL BY RT PCR (FLU A&B, COVID)
Influenza A by PCR: NEGATIVE
Influenza B by PCR: NEGATIVE
SARS Coronavirus 2 by RT PCR: NEGATIVE

## 2019-03-01 LAB — CBC
HCT: 46.8 % (ref 39.0–52.0)
Hemoglobin: 15.5 g/dL (ref 13.0–17.0)
MCH: 34.8 pg — ABNORMAL HIGH (ref 26.0–34.0)
MCHC: 33.1 g/dL (ref 30.0–36.0)
MCV: 104.9 fL — ABNORMAL HIGH (ref 80.0–100.0)
Platelets: 188 10*3/uL (ref 150–400)
RBC: 4.46 MIL/uL (ref 4.22–5.81)
RDW: 12.7 % (ref 11.5–15.5)
WBC: 6 10*3/uL (ref 4.0–10.5)
nRBC: 0 % (ref 0.0–0.2)

## 2019-03-01 LAB — TYPE AND SCREEN
ABO/RH(D): O POS
Antibody Screen: NEGATIVE

## 2019-03-01 LAB — ABO/RH: ABO/RH(D): O POS

## 2019-03-01 MED ORDER — HYDROMORPHONE HCL 1 MG/ML IJ SOLN
0.5000 mg | Freq: Once | INTRAMUSCULAR | Status: AC
  Administered 2019-03-01: 0.5 mg via INTRAVENOUS
  Filled 2019-03-01: qty 1

## 2019-03-01 MED ORDER — OXYCODONE HCL 5 MG PO TABS
10.0000 mg | ORAL_TABLET | ORAL | Status: DC | PRN
  Administered 2019-03-01 – 2019-03-11 (×37): 10 mg via ORAL
  Filled 2019-03-01 (×40): qty 2

## 2019-03-01 MED ORDER — METHOCARBAMOL 1000 MG/10ML IJ SOLN
500.0000 mg | Freq: Four times a day (QID) | INTRAVENOUS | Status: DC | PRN
  Filled 2019-03-01 (×4): qty 5

## 2019-03-01 MED ORDER — LOSARTAN POTASSIUM-HCTZ 100-25 MG PO TABS: 1.0000 | ORAL_TABLET | Freq: Every day | ORAL | Status: DC

## 2019-03-01 MED ORDER — FENTANYL CITRATE (PF) 100 MCG/2ML IJ SOLN
100.0000 ug | Freq: Once | INTRAMUSCULAR | Status: AC
  Administered 2019-03-01: 100 ug via INTRAVENOUS
  Filled 2019-03-01: qty 2

## 2019-03-01 MED ORDER — POTASSIUM CHLORIDE IN NACL 20-0.9 MEQ/L-% IV SOLN
INTRAVENOUS | Status: DC
  Filled 2019-03-01 (×4): qty 1000

## 2019-03-01 MED ORDER — DILTIAZEM HCL ER COATED BEADS 120 MG PO CP24
120.0000 mg | ORAL_CAPSULE | Freq: Every day | ORAL | Status: DC
  Administered 2019-03-02 – 2019-03-03 (×2): 120 mg via ORAL
  Filled 2019-03-01 (×2): qty 1

## 2019-03-01 MED ORDER — LOSARTAN POTASSIUM 50 MG PO TABS
100.0000 mg | ORAL_TABLET | Freq: Every day | ORAL | Status: DC
  Administered 2019-03-02 – 2019-03-05 (×4): 100 mg via ORAL
  Filled 2019-03-01 (×4): qty 2

## 2019-03-01 MED ORDER — ACETAMINOPHEN 325 MG PO TABS
650.0000 mg | ORAL_TABLET | Freq: Four times a day (QID) | ORAL | Status: DC
  Administered 2019-03-01 – 2019-03-03 (×8): 650 mg via ORAL
  Filled 2019-03-01 (×8): qty 2

## 2019-03-01 MED ORDER — HYDROCHLOROTHIAZIDE 25 MG PO TABS
25.0000 mg | ORAL_TABLET | Freq: Every day | ORAL | Status: DC
  Administered 2019-03-02 – 2019-03-05 (×4): 25 mg via ORAL
  Filled 2019-03-01 (×4): qty 1

## 2019-03-01 MED ORDER — ZOLPIDEM TARTRATE 5 MG PO TABS: 5.0000 mg | ORAL_TABLET | Freq: Every evening | ORAL | Status: DC | PRN

## 2019-03-01 MED ORDER — MORPHINE SULFATE (PF) 2 MG/ML IV SOLN
2.0000 mg | INTRAVENOUS | Status: DC | PRN
  Administered 2019-03-01 – 2019-03-02 (×2): 2 mg via INTRAVENOUS
  Filled 2019-03-01 (×2): qty 1

## 2019-03-01 MED ORDER — DOCUSATE SODIUM 100 MG PO CAPS
100.0000 mg | ORAL_CAPSULE | Freq: Two times a day (BID) | ORAL | Status: DC
  Administered 2019-03-01 – 2019-03-09 (×9): 100 mg via ORAL
  Filled 2019-03-01 (×17): qty 1

## 2019-03-01 MED ORDER — ENOXAPARIN SODIUM 30 MG/0.3ML ~~LOC~~ SOLN
30.0000 mg | SUBCUTANEOUS | Status: DC
  Administered 2019-03-01: 30 mg via SUBCUTANEOUS
  Filled 2019-03-01: qty 0.3

## 2019-03-01 MED ORDER — METHOCARBAMOL 500 MG PO TABS
500.0000 mg | ORAL_TABLET | Freq: Four times a day (QID) | ORAL | Status: DC | PRN
  Administered 2019-03-01 – 2019-03-11 (×25): 500 mg via ORAL
  Filled 2019-03-01 (×29): qty 1

## 2019-03-01 MED ORDER — IOHEXOL 300 MG/ML  SOLN
100.0000 mL | Freq: Once | INTRAMUSCULAR | Status: AC | PRN
  Administered 2019-03-01: 100 mL via INTRAVENOUS

## 2019-03-01 MED ORDER — MESALAMINE 1.2 G PO TBEC
2.4000 g | DELAYED_RELEASE_TABLET | Freq: Every day | ORAL | Status: DC
  Administered 2019-03-02 – 2019-03-11 (×10): 2.4 g via ORAL
  Filled 2019-03-01 (×10): qty 2

## 2019-03-01 NOTE — H&P (Signed)
History and Physical    [name]:[id number] DOB: 28-Feb-1945 DOA: 03/01/2019  PCP: Josetta Huddle, MD  Patient coming from: Home  I have personally briefly reviewed patient's old medical records in [place]  Chief Complaint: Slipped on black ice  HPI: Steven Blair is a 74 y.o. male with medical history significant of atrial fibrillation, nephrolithiasis, sleep apnea9 improved with weight loss) ulcerative colitis who presents to the emergency department after slipping and falling on black ice this morning.  He had immediate right hip pain and could not get up.  He came to the ED where CT scan showed an acute fracture of the lesser trochanter.  He also states that when he went to get his Covid vaccine last Friday stood in line a long time and developed left hip pain for which she was going to speak with the orthopedist today.  Unfortunately T scan showed a fracture of the lesser trochanter.  Orthopedic surgery saw the patient and felt that conservative management with physical therapy and pain control was in order and referred the patient to [place].  They plan to evaluate the patient in a couple weeks as an outpatient and consider appropriate timing for left hip replacement which needs to be done.                      ED Course: CT scan showed fracture of the right lesser trochanter.  Persistent pain of the left hip associated with chronic arthritis.  Developed hematuria in the emergency department.  CT scan shows nephrolithiasis without obstructing stone.  Patient is anticoagulated due to atrial fibrillation.  Review of Systems: As per HPI otherwise all other systems reviewed and  negative.   Past Medical History:  Diagnosis Date  . BMI 40.0-44.9, adult (HCC)    Greater than 40 BMI  . 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 Xarelto - no longer taking  . Dysrhythmia    Atrial Fib x1 episode, cardioversion 4'16- NSR  . History of kidney stones    x1 -20 yrs ago   . Hypertension   . Sleep apnea    no cpap used, couldn't tolerate  . Ulcerative colitis ([place])    history of , some current issues at this time    Past Surgical History:  Procedure Laterality Date  . CARDIOVERSION     tx Paroxysmal Atrial Fibrillation- converted to NSR  . CARDIOVERSION N/A 11/06/2018   Procedure: CARDIOVERSION;  Surgeon: Donato Heinz, MD;  [facility] ENDOSCOPY;  Service: Endoscopy;  Laterality: N/A;  . COLONOSCOPY W/ POLYPECTOMY    . FEMUR SURGERY Left    ORIF 10'05  . FLEXIBLE SIGMOIDOSCOPY N/A 11/01/2014   Procedure: FLEXIBLE SIGMOIDOSCOPY;  Surgeon: Garlan Fair, MD;  Location: [facility] ENDOSCOPY;  Service: Endoscopy;  Laterality: N/A;  . JOINT REPLACEMENT     BTHA    Social History   Social History Narrative  . Not on file     reports that he has never smoked. He has never used smokeless tobacco. He reports current alcohol use of about 3.0 standard drinks of alcohol per week. He reports that he does not use drugs.  Allergies  Allergen Reactions  . Sulfa Antibiotics Other (See Comments)    Childhood allergy    Family History  Problem Relation Age of Onset  . Lung cancer Brother      Prior to Admission medications   Medication Sig Start  Date End Date Taking? Authorizing Provider  acetaminophen (TYLENOL) 500 MG tablet Take 1,500 mg by mouth every 8 (eight) hours as needed for mild pain or moderate pain.    Yes [provider]  Cholecalciferol (VITAMIN D3) 50 MCG (2000 UT) capsule Take 2,000 Units by mouth daily.   Yes [provider]  diltiazem (CARDIZEM CD) 120 MG 24 hr capsule TAKE 1 CAPSULE BY MOUTH DAILY Patient taking differently: Take 120 mg by mouth daily.  01/27/19  Yes Fenton, Clint R, PA  ELIQUIS 5 MG TABS tablet TAKE 1 TABLET BY MOUTH 2 TIMES DAILY. Patient taking differently: Take 5 mg by mouth 2 (two) times daily.  02/05/19  Yes Fenton, Clint R, PA  losartan-hydrochlorothiazide (HYZAAR) 100-25 MG tablet Take 1  tablet by mouth daily.  09/12/18  Yes [provider]  mesalamine (LIALDA) 1.2 g EC tablet Take 2.4 g by mouth daily. 02/01/19  Yes [provider]    Physical Exam:  Constitutional: NAD, calm, uncomfortable with any movement of the right hip Vitals:   03/01/19 1615 03/01/19 1630 03/01/19 1740 03/01/19 1830  BP: 140/79 133/73 136/73 116/68  Pulse: 96 98 94 90  Resp:   16 18  Temp:   99 F (37.2 C) 99.2 F (37.3 C)  TempSrc:   Oral Oral  SpO2: 97% 95% 96% 96%  Weight:    118.7 kg  Height:    6' (1.829 m)   Eyes: PERRL, lids and conjunctivae normal ENMT: Mucous membranes are moist. Posterior pharynx clear of any exudate or lesions.Normal dentition.  Neck: normal, supple, no masses, no thyromegaly Respiratory: clear to auscultation bilaterally, no wheezing, no crackles. Normal respiratory effort. No accessory muscle use.  Cardiovascular: Regular rate and rhythm, no murmurs / rubs / gallops. No extremity edema. 2+ pedal pulses. No carotid bruits.  Abdomen: no tenderness, no masses palpated. No hepatosplenomegaly. Bowel sounds positive.  Dark red-tinged urine Musculoskeletal: no clubbing / cyanosis. No joint deformity upper and lower extremities. Good ROM except for right lower extremity which has pain with any type of motion, no contractures. Normal muscle tone.  Skin: no rashes, lesions, ulcers. No induration Neurologic: CN 2-12 grossly intact. Sensation intact, DTR normal. Strength 5/5 in all 4.  Psychiatric: Normal judgment and insight. Alert and oriented x 3. Normal mood.    Labs on Admission: I have personally reviewed following labs and imaging studies  CBC: Recent Labs  Lab 03/01/19 1211  WBC 6.0  HGB 15.5  HCT 46.8  MCV 104.9*  PLT 992   Basic Metabolic Panel: Recent Labs  Lab 03/01/19 1211  NA 140  K 4.2  CL 103  CO2 25  GLUCOSE 126*  BUN 15  CREATININE 1.01  CALCIUM 9.1   GFR: Estimated Creatinine Clearance: 86.6 mL/min (by C-G formula  based on SCr of 1.01 mg/dL).  Urine analysis:    Component Value Date/Time   COLORURINE YELLOW 03/01/2019 1550   APPEARANCEUR CLEAR 03/01/2019 1550   LABSPEC 1.014 03/01/2019 1550   PHURINE 7.0 03/01/2019 1550   GLUCOSEU NEGATIVE 03/01/2019 1550   HGBUR LARGE (A) 03/01/2019 1550   BILIRUBINUR NEGATIVE 03/01/2019 1550   KETONESUR NEGATIVE 03/01/2019 1550   PROTEINUR NEGATIVE 03/01/2019 1550   UROBILINOGEN 0.2 11/08/2014 1440   NITRITE NEGATIVE 03/01/2019 1550   LEUKOCYTESUR NEGATIVE 03/01/2019 1550    Radiological Exams on Admission: CT ABDOMEN PELVIS W CONTRAST  Result Date: 03/01/2019 CLINICAL DATA:  Abdominal pain after fall today. EXAM: CT ABDOMEN AND PELVIS WITH  CONTRAST TECHNIQUE: Multidetector CT imaging of the abdomen and pelvis was performed using the standard protocol following bolus administration of intravenous contrast. CONTRAST:  126m OMNIPAQUE IOHEXOL 300 MG/ML  SOLN COMPARISON:  October 31, 2014. FINDINGS: Lower chest: No acute abnormality. Hepatobiliary: Minimal cholelithiasis is noted. No biliary dilatation is noted. The liver is unremarkable. Pancreas: Unremarkable. No pancreatic ductal dilatation or surrounding inflammatory changes. Spleen: Normal in size without focal abnormality. Adrenals/Urinary Tract: Adrenal glands appear normal. Left renal cyst is noted. Nonobstructive right nephrolithiasis is noted. Cortical scarring is noted in the right lower pole. No hydronephrosis or renal obstruction is noted. No ureteral calculi are noted. Urinary bladder is unremarkable. Stomach/Bowel: Stomach is within normal limits. Appendix appears normal. No evidence of bowel wall thickening, distention, or inflammatory changes. Vascular/Lymphatic: Aortic atherosclerosis. No enlarged abdominal or pelvic lymph nodes. Reproductive: Prostate is unremarkable. Other: No abdominal wall hernia or abnormality. No abdominopelvic ascites. Musculoskeletal: Status post bilateral hip arthroplasty. No  acute osseous abnormality is noted. IMPRESSION: 1. Minimal cholelithiasis. 2. Nonobstructive right nephrolithiasis. No hydronephrosis or renal obstruction is noted. 3. No acute abnormality seen in the abdomen or pelvis. Aortic Atherosclerosis (ICD10-I70.0). Electronically Signed   By: JMarijo ConceptionM.D.   On: 03/01/2019 14:22   CT Hip Right Wo Contrast  Result Date: 03/01/2019 CLINICAL DATA:  Right hip pain after fall on ice this morning. EXAM: CT OF THE RIGHT HIP WITHOUT CONTRAST TECHNIQUE: Multidetector CT imaging of the right hip was performed according to the standard protocol. Multiplanar CT image reconstructions were also generated. COMPARISON:  Right hip x-rays from same day. CT abdomen pelvis dated October 31, 2014. FINDINGS: Bones/Joint/Cartilage Acute minimally displaced fracture of the right lesser trochanter, best appreciated on the coronal images. No additional fracture. No dislocation. Prior right total hip arthroplasty without hardware complication. Partially visualized left hip arthroplasty. No joint effusion. Ligaments Ligaments are suboptimally evaluated by CT. Muscles and Tendons Grossly intact. Mild gluteal muscle atrophy. Soft tissue No fluid collection or hematoma. No soft tissue mass. Mild prostatomegaly. Aortoiliac atherosclerotic vascular disease. IMPRESSION: 1. Acute minimally displaced fracture of the right lesser trochanter. 2. Right total hip arthroplasty without hardware complication. Electronically Signed   By: WTitus DubinM.D.   On: 03/01/2019 09:40   DG Hip Unilat W or Wo Pelvis 2-3 Views Right  Result Date: 03/01/2019 CLINICAL DATA:  74year old male status post fall this morning in driveway. EXAM: DG HIP (WITH OR WITHOUT PELVIS) 2-3V RIGHT COMPARISON:  Left hip CT 10/18/2017. Left hip series 10/18/2017. FINDINGS: Chronic bilateral hip arthroplasty. Alignment of the right hip arthroplasty component appears stable, with no surrounding right hip region acute fracture  identified. Visible left total hip arthroplasty also appears stable. No acute pelvis fracture identified. Negative visible bowel gas pattern. Pelvic phleboliths. IMPRESSION: Chronic bilateral hip arthroplasties. No acute fracture or dislocation identified about the right hip or pelvis. Electronically Signed   By: HGenevie AnnM.D.   On: 03/01/2019 08:30     Assessment/Plan Active Problems:   Closed avulsion fracture of lesser trochanter of femur, right, initial encounter ([place]   Hematuria   Left hip pain   Atrial fibrillation (HCC)   Nephrolithiasis   Sleep apnea   Hypertension   Cholelithiasis    1.  Closed avulsion fracture of the lesser trochanter of the femur, right: Management per orthopedic surgery.  They recommend pain control, PT and OT, and follow-up in their office in 2 weeks.  They recommend weightbearing as tolerated.  Patient will be  placed on the hip injury protocol.  PT and OT have been consulted.  Tylenol 650 p.o. every 6 hours with oxycodone 10 mg p.o. every 4 hours as needed.  IV morphine ordered for severe pain.  Patient will hopefully be able to tolerate and ambulate so that he can go home and do outpatient therapy otherwise home health versus skilled facility will need to be considered.  2.  Hematuria: Not related to acute nephrolithiasis.  CT scan did not show any obstructing lesions.  Patient did have a fairly hard fall and may have developed some mild trauma without any serious injury now exhibiting some hematuria.  Will monitor closely.  If persists will consider urology evaluation.  3.  Left hip pain due to osteoarthritis: Patient to see orthopedics as an outpatient once acute right hip injury improved.  4.  Paroxysmal atrial fibrillation: On Eliquis and rate controlled.  Noted.  It may also be contributing to hematuria.  5.  History of sleep apnea: Patient used to have a BMI of 45.  He is now down to 35.  Continue to monitor for sleep apnea.  Request patient have his  machine brought in from home.  6.  Hypertension: Patient has lost a lot of weight and since then blood pressures have improved.  He currently takes a thiazide which has been continued.  7.  Cholelithiasis: Noted on CT scan but appears to be mild and not causing any acute issues.  DVT prophylaxis: On Eliquis will hold for now Code Status: Full code Family Communication: No family present at the time of admission.  Patient is his own Media planner. Disposition Plan: Likely home in 3 to 4 days versus skilled facility. Consults called: Orthopedic surgery, PT OT ST, and transitions of care. Admission status: Observation It is my clinical opinion that referral for OBSERVATION is reasonable and necessary in this patient based on the above information provided. The aforementioned taken together are felt to place the patient at high risk for further clinical deterioration. However it is anticipated that the patient may be medically stable for discharge from the hospital within 24 to 48 hours.   Lady Deutscher MD FACP [facility] Pager [phone]  How to contact the [facility] Attending or Consulting provider [place] or covering provider during after hours [place], for this patient?  1. Check the care team in [facility] and look for a) attending/consulting TR[facility] provider listed and b) the [facility] team listed 2. Log into [email or web address] and use [place]'s universal password to access. If you do not have the password, please contact the hospital operator. 3. Locate the [facility] provider you are looking for under [facility] and page to a number that you can be directly reached. 4. If you still have difficulty reaching the provider, please page the [facility] (Director on Call) for the Hospitalists listed on amion for assistance.  If 7PM-7AM, please contact night-coverage [email or web address] Password [facility]  03/01/2019, 7:31 PM

## 2019-03-01 NOTE — ED Triage Notes (Signed)
Pt last ate and drank last night

## 2019-03-01 NOTE — ED Triage Notes (Signed)
Pt here from a fall on black ice in his driveway. Pt fell on rt hip..hx of rt hip replacement. Hx of afib pt takes eliquis. A&ox4. Pain 10/10 on movement

## 2019-03-01 NOTE — ED Notes (Signed)
Pt.undress on the monitor,vitalsign in. Warm blanket givin,yellow socks on.givin urinal to use

## 2019-03-01 NOTE — Plan of Care (Signed)
  Problem: Education: Goal: Verbalization of understanding the information provided (i.e., activity precautions, restrictions, etc) will improve Outcome: Progressing

## 2019-03-01 NOTE — ED Notes (Signed)
Dinner Programmer, systems @ [phone].

## 2019-03-01 NOTE — ED Notes (Signed)
Pt transported to CT ?

## 2019-03-01 NOTE — ED Provider Notes (Signed)
[place] EMERGENCY DEPARTMENT Provider Note   CSN: [id number] Arrival date & time: 03/01/19  0736     History Chief Complaint  Patient presents with  . Hip Pain    Steven Blair is a 74 y.o. male.  HPI Patient presents with right hip pain after fall.  Fell on some ice in his driveway today.  Landed directly on his right hip.  Has been unable to ambulate since.  Has had a previous right hip replacement.  Also has had left hip surgery previously.  No other injury.  He is on anticoagulation however.  States however he has had some left hip pain also.  States he was going to follow-up with orthopedic surgery today about the pain.  He is on anticoagulation and took it this morning.  Has not had breakfast however.  Patient did not hit his neck.    Past Medical History:  Diagnosis Date  . BMI 40.0-44.9, adult (HCC)    Greater than 40 BMI  . 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 Xarelto - no longer taking  . Dysrhythmia    Atrial Fib x1 episode, cardioversion 4'16- NSR  . History of kidney stones    x1 -20 yrs ago  . Hypertension   . Sleep apnea    no cpap used, couldn't tolerate  . Ulcerative colitis ([place])    history of , some current issues at this time    Patient Active Problem List   Diagnosis Date Noted  . Acquired thrombophilia ([place]) 12/23/2018  . Atrial fibrillation ([place]) 10/14/2018  . Inability to walk 10/19/2017  . Hip pain 10/19/2017  . Left hip pain 10/19/2017  . Protein-calorie malnutrition, severe 11/01/2014  . Lower GI bleed 10/31/2014  . Colitis 10/31/2014  . History of DVT of lower extremity 10/31/2014  . Sleep apnea   . Hypertension   . BMI 40.0-44.9, adult [facility])     Past Surgical History:  Procedure Laterality Date  . CARDIOVERSION     tx Paroxysmal Atrial Fibrillation- converted to NSR  . CARDIOVERSION N/A 11/06/2018   Procedure: CARDIOVERSION;  Surgeon: Donato Heinz, MD;  Location: [facility] ENDOSCOPY;   Service: Endoscopy;  Laterality: N/A;  . COLONOSCOPY W/ POLYPECTOMY    . FEMUR SURGERY Left    ORIF 10'05  . FLEXIBLE SIGMOIDOSCOPY N/A 11/01/2014   Procedure: FLEXIBLE SIGMOIDOSCOPY;  Surgeon: Garlan Fair, MD;  Location: [facility] ENDOSCOPY;  Service: Endoscopy;  Laterality: N/A;  . JOINT REPLACEMENT     BTHA       Family History  Problem Relation Age of Onset  . Lung cancer Brother     Social History   Tobacco Use  . Smoking status: Never Smoker  . Smokeless tobacco: Never Used  Substance Use Topics  . Alcohol use: Yes    Alcohol/week: 3.0 standard drinks    Types: 1 Glasses of wine, 2 Cans of beer per week    Comment: weekend  . Drug use: No    Home Medications Prior to Admission medications   Medication Sig Start Date End Date Taking? Authorizing Provider  acetaminophen (TYLENOL) 500 MG tablet Take 1,500 mg by mouth every 8 (eight) hours as needed for mild pain or moderate pain.    Yes [provider]  Cholecalciferol (VITAMIN D3) 50 MCG (2000 UT) capsule Take 2,000 Units by mouth daily.   Yes [provider]  diltiazem (CARDIZEM CD) 120 MG 24 hr capsule TAKE  1 CAPSULE BY MOUTH DAILY Patient taking differently: Take 120 mg by mouth daily.  01/27/19  Yes Fenton, Clint R, PA  ELIQUIS 5 MG TABS tablet TAKE 1 TABLET BY MOUTH 2 TIMES DAILY. Patient taking differently: Take 5 mg by mouth 2 (two) times daily.  02/05/19  Yes Fenton, Clint R, PA  losartan-hydrochlorothiazide (HYZAAR) 100-25 MG tablet Take 1 tablet by mouth daily.  09/12/18  Yes [provider]  mesalamine (LIALDA) 1.2 g EC tablet Take 2.4 g by mouth daily. 02/01/19  Yes [provider]    Allergies    Sulfa antibiotics  Review of Systems   Review of Systems  Constitutional: Negative for appetite change.  HENT: Negative for congestion.   Respiratory: Negative for shortness of breath.   Gastrointestinal: Negative for abdominal pain.  Genitourinary: Negative for flank pain.    Musculoskeletal:       Right hip pain.  Left hip pain.  Skin: Negative for wound.  Psychiatric/Behavioral: Negative for confusion.    Physical Exam Updated Vital Signs BP 118/75   Pulse 97   Temp 99.5 F (37.5 C) (Oral)   Resp 15   Ht 6' (1.829 m)   Wt 115.7 kg   SpO2 98%   BMI 34.58 kg/m   Physical Exam Vitals and nursing note reviewed.  HENT:     Head: Normocephalic.  Eyes:     Pupils: Pupils are equal, round, and reactive to light.  Cardiovascular:     Rate and Rhythm: Normal rate.  Pulmonary:     Breath sounds: No wheezing or rhonchi.  Abdominal:     Tenderness: There is no abdominal tenderness.  Musculoskeletal:     Comments: No tenderness over either knee.  Sensation intact right foot.  Tenderness over right hip laterally.  No cervical spine tenderness.  Neurological:     Mental Status: He is alert.     ED Results / Procedures / Treatments   Labs (all labs ordered are listed, but only abnormal results are displayed) Labs Reviewed  BASIC METABOLIC PANEL - Abnormal; Notable for the following components:      Result Value   Glucose, Bld 126 (*)    All other components within normal limits  CBC - Abnormal; Notable for the following components:   MCV 104.9 (*)    MCH 34.8 (*)    All other components within normal limits  URINALYSIS, ROUTINE W REFLEX MICROSCOPIC    EKG None  Radiology CT Hip Right Wo Contrast  Result Date: 03/01/2019 CLINICAL DATA:  Right hip pain after fall on ice this morning. EXAM: CT OF THE RIGHT HIP WITHOUT CONTRAST TECHNIQUE: Multidetector CT imaging of the right hip was performed according to the standard protocol. Multiplanar CT image reconstructions were also generated. COMPARISON:  Right hip x-rays from same day. CT abdomen pelvis dated October 31, 2014. FINDINGS: Bones/Joint/Cartilage Acute minimally displaced fracture of the right lesser trochanter, best appreciated on the coronal images. No additional fracture. No dislocation.  Prior right total hip arthroplasty without hardware complication. Partially visualized left hip arthroplasty. No joint effusion. Ligaments Ligaments are suboptimally evaluated by CT. Muscles and Tendons Grossly intact. Mild gluteal muscle atrophy. Soft tissue No fluid collection or hematoma. No soft tissue mass. Mild prostatomegaly. Aortoiliac atherosclerotic vascular disease. IMPRESSION: 1. Acute minimally displaced fracture of the right lesser trochanter. 2. Right total hip arthroplasty without hardware complication. Electronically Signed   By: Titus Dubin M.D.   On: 03/01/2019 09:40   DG Hip Unilat  W or Wo Pelvis 2-3 Views Right  Result Date: 03/01/2019 CLINICAL DATA:  74 year old male status post fall this morning in driveway. EXAM: DG HIP (WITH OR WITHOUT PELVIS) 2-3V RIGHT COMPARISON:  Left hip CT 10/18/2017. Left hip series 10/18/2017. FINDINGS: Chronic bilateral hip arthroplasty. Alignment of the right hip arthroplasty component appears stable, with no surrounding right hip region acute fracture identified. Visible left total hip arthroplasty also appears stable. No acute pelvis fracture identified. Negative visible bowel gas pattern. Pelvic phleboliths. IMPRESSION: Chronic bilateral hip arthroplasties. No acute fracture or dislocation identified about the right hip or pelvis. Electronically Signed   By: Genevie Ann M.D.   On: 03/01/2019 08:30    Procedures Procedures (including critical care time)  Medications Ordered in ED Medications  fentaNYL (SUBLIMAZE) injection 100 mcg (100 mcg Intravenous Given 03/01/19 0804)  HYDROmorphone (DILAUDID) injection 0.5 mg (0.5 mg Intravenous Given 03/01/19 1316)  iohexol (OMNIPAQUE) 300 MG/ML solution 100 mL (100 mLs Intravenous Contrast Given 03/01/19 1359)    ED Course  I have reviewed the triage vital signs and the nursing notes.  Pertinent labs & imaging results that were available during my care of the patient were reviewed by me and considered in my  medical decision making (see chart for details).    MDM Rules/Calculators/A&P                      Patient mechanical fall.  Landed on her right hip.  Lesser trochanteric fracture, however cannot ambulate due to pain.  Pain medicine given.  He is on anticoagulation.  However patient did develop hematuria.  CT scan pending.  Ortho had seen patient.  Care turned over to Dr. Ashok Cordia Final Clinical Impression(s) / ED Diagnoses Final diagnoses:  Fall, initial encounter  Closed avulsion fracture of lesser trochanter of right femur, initial encounter ([place])  Hematuria, unspecified type    Rx / DC Orders ED Discharge Orders    None       Davonna Belling, MD 03/01/19 1542

## 2019-03-01 NOTE — ED Notes (Signed)
Pt. Called for help to his room, upon entering, pt explained that he has been urinating a few times, and the urine color went from yellow/straw colored to pink/red and is currently oozing nothing but blood. Has some mild left flank pain and pt is concerned about his kidney.  EDP [place] is aware.

## 2019-03-01 NOTE — ED Provider Notes (Signed)
Signed out by Dr Alvino Chapel at [phone] to call hospitalists for admission when CT abd back, as unable to ambulate s/p hip injury - ortho consult recommending admission, PT, pain control.   CT abd neg for acute trauma. UA sent - results pending. ?hem cystitis, vs hematuria (is on anticoag therapy).   Hospitalist consulted for admission.      Lajean Saver, MD 03/01/19 [id number]

## 2019-03-01 NOTE — ED Notes (Signed)
Pt reports new pain in his lower back. MD aware.

## 2019-03-01 NOTE — Consult Note (Signed)
Reason for Consult:Right hip fx Referring Physician: Robyn Blair   Steven Blair is an 74 y.o. male.  HPI: Steven Blair was getting the paper this morning and slipped on the ice and fell. He had immediate right hip pain and could not get up. He came to the ED where a CT scan showed a periprosthetic lesser troch fx and orthopedic surgery was consulted. He has also been having left hip pain since standing in line for a couple of hours to get his Covid vaccine on Friday.  Past Medical History:  Diagnosis Date  . BMI 40.0-44.9, adult (HCC)    Greater than 40 BMI  . 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 Xarelto - no longer taking  . Dysrhythmia    Atrial Fib x1 episode, cardioversion 4'16- NSR  . History of kidney stones    x1 -20 yrs ago  . Hypertension   . Sleep apnea    no cpap used, couldn't tolerate  . Ulcerative colitis ([place])    history of , some current issues at this time    Past Surgical History:  Procedure Laterality Date  . CARDIOVERSION     tx Paroxysmal Atrial Fibrillation- converted to NSR  . CARDIOVERSION N/A 11/06/2018   Procedure: CARDIOVERSION;  Surgeon: Donato Heinz, MD;  Location: [facility] ENDOSCOPY;  Service: Endoscopy;  Laterality: N/A;  . COLONOSCOPY W/ POLYPECTOMY    . FEMUR SURGERY Left    ORIF 10'05  . FLEXIBLE SIGMOIDOSCOPY N/A 11/01/2014   Procedure: FLEXIBLE SIGMOIDOSCOPY;  Surgeon: Garlan Fair, MD;  Location: [facility] ENDOSCOPY;  Service: Endoscopy;  Laterality: N/A;  . JOINT REPLACEMENT     BTHA    Family History  Problem Relation Age of Onset  . Lung cancer Brother     Social History:  reports that he has never smoked. He has never used smokeless tobacco. He reports current alcohol use of about 3.0 standard drinks of alcohol per week. He reports that he does not use drugs.  Allergies:  Allergies  Allergen Reactions  . Sulfa Antibiotics Other (See Comments)    Childhood allergy    Medications: I have reviewed the  patient's current medications.  No results found for this or any previous visit (from the past 48 hour(s)).  CT Hip Right Wo Contrast  Result Date: 03/01/2019 CLINICAL DATA:  Right hip pain after fall on ice this morning. EXAM: CT OF THE RIGHT HIP WITHOUT CONTRAST TECHNIQUE: Multidetector CT imaging of the right hip was performed according to the standard protocol. Multiplanar CT image reconstructions were also generated. COMPARISON:  Right hip x-rays from same day. CT abdomen pelvis dated October 31, 2014. FINDINGS: Bones/Joint/Cartilage Acute minimally displaced fracture of the right lesser trochanter, best appreciated on the coronal images. No additional fracture. No dislocation. Prior right total hip arthroplasty without hardware complication. Partially visualized left hip arthroplasty. No joint effusion. Ligaments Ligaments are suboptimally evaluated by CT. Muscles and Tendons Grossly intact. Mild gluteal muscle atrophy. Soft tissue No fluid collection or hematoma. No soft tissue mass. Mild prostatomegaly. Aortoiliac atherosclerotic vascular disease. IMPRESSION: 1. Acute minimally displaced fracture of the right lesser trochanter. 2. Right total hip arthroplasty without hardware complication. Electronically Signed   By: Titus Dubin M.D.   On: 03/01/2019 09:40   DG Hip Unilat W or Wo Pelvis 2-3 Views Right  Result Date: 03/01/2019 CLINICAL DATA:  74 year old male status post fall this morning in driveway. EXAM: DG HIP (WITH OR WITHOUT PELVIS)  2-3V RIGHT COMPARISON:  Left hip CT 10/18/2017. Left hip series 10/18/2017. FINDINGS: Chronic bilateral hip arthroplasty. Alignment of the right hip arthroplasty component appears stable, with no surrounding right hip region acute fracture identified. Visible left total hip arthroplasty also appears stable. No acute pelvis fracture identified. Negative visible bowel gas pattern. Pelvic phleboliths. IMPRESSION: Chronic bilateral hip arthroplasties. No acute  fracture or dislocation identified about the right hip or pelvis. Electronically Signed   By: Genevie Ann M.D.   On: 03/01/2019 08:30    Review of Systems  HENT: Negative for ear discharge, ear pain, hearing loss and tinnitus.   Eyes: Negative for photophobia and pain.  Respiratory: Negative for cough and shortness of breath.   Cardiovascular: Negative for chest pain.  Gastrointestinal: Negative for abdominal pain, nausea and vomiting.  Genitourinary: Negative for dysuria, flank pain, frequency and urgency.  Musculoskeletal: Positive for arthralgias (Bilateral hips). Negative for back pain, myalgias and neck pain.  Neurological: Negative for dizziness and headaches.  Hematological: Does not bruise/bleed easily.  Psychiatric/Behavioral: The patient is not nervous/anxious.    Blood pressure (!) 144/81, pulse 99, temperature 99.5 F (37.5 C), temperature source Oral, resp. rate 15, height 6' (1.829 m), weight 115.7 kg, SpO2 97 %. Physical Exam  Constitutional: He appears well-developed and well-nourished. No distress.  HENT:  Head: Normocephalic and atraumatic.  Eyes: Conjunctivae are normal. Right eye exhibits no discharge. Left eye exhibits no discharge. No scleral icterus.  Cardiovascular: Normal rate and regular rhythm.  Respiratory: Effort normal. No respiratory distress.  Musculoskeletal:     Cervical back: Normal range of motion.     Comments: RLE No traumatic wounds, ecchymosis, or rash  Mod TTP lateral hip, severe pain with AROM/PROM  No knee or ankle effusion  Knee stable to varus/ valgus and anterior/posterior stress  Sens DPN, SPN, TN intact  Motor EHL, ext, flex, evers 5/5  DP 1+, PT 0, No significant edema  LLE No traumatic wounds, ecchymosis, or rash  Nontender, mild pain with AROM, PROM  No knee or ankle effusion  Knee stable to varus/ valgus and anterior/posterior stress  Sens DPN, SPN, TN intact  Motor EHL, ext, flex, evers 5/5  DP 1+, PT 0, No significant edema   Neurological: He is alert.  Skin: Skin is warm and dry. He is not diaphoretic.  Psychiatric: He has a normal mood and affect. His behavior is normal.    Assessment/Plan: Right lesser troch fx -- This should heal well without surgical management. He may be WBAT BLE. He should f/u with Dr. Alvan Dame in 1-2 weeks. Multiple medical problems including HTN, OSA, UC, and DVT on [place], PA-C Orthopedic Surgery [phone] 03/01/2019, 12:17 PM

## 2019-03-02 ENCOUNTER — Ambulatory Visit: Payer: No Typology Code available for payment source

## 2019-03-02 DIAGNOSIS — M62838 Other muscle spasm: Secondary | ICD-10-CM | POA: Diagnosis present

## 2019-03-02 DIAGNOSIS — S72121A Displaced fracture of lesser trochanter of right femur, initial encounter for closed fracture: Secondary | ICD-10-CM | POA: Diagnosis present

## 2019-03-02 DIAGNOSIS — D696 Thrombocytopenia, unspecified: Secondary | ICD-10-CM | POA: Diagnosis present

## 2019-03-02 DIAGNOSIS — N179 Acute kidney failure, unspecified: Secondary | ICD-10-CM | POA: Diagnosis not present

## 2019-03-02 DIAGNOSIS — K5190 Ulcerative colitis, unspecified, without complications: Secondary | ICD-10-CM | POA: Diagnosis present

## 2019-03-02 DIAGNOSIS — I4819 Other persistent atrial fibrillation: Secondary | ICD-10-CM | POA: Diagnosis present

## 2019-03-02 DIAGNOSIS — N200 Calculus of kidney: Secondary | ICD-10-CM

## 2019-03-02 DIAGNOSIS — W000XXA Fall on same level due to ice and snow, initial encounter: Secondary | ICD-10-CM | POA: Diagnosis present

## 2019-03-02 DIAGNOSIS — M25551 Pain in right hip: Secondary | ICD-10-CM | POA: Diagnosis present

## 2019-03-02 DIAGNOSIS — Z20822 Contact with and (suspected) exposure to covid-19: Secondary | ICD-10-CM | POA: Diagnosis present

## 2019-03-02 DIAGNOSIS — I700 Atherosclerosis of aorta: Secondary | ICD-10-CM | POA: Diagnosis present

## 2019-03-02 DIAGNOSIS — D7589 Other specified diseases of blood and blood-forming organs: Secondary | ICD-10-CM | POA: Diagnosis not present

## 2019-03-02 DIAGNOSIS — Z6835 Body mass index (BMI) 35.0-35.9, adult: Secondary | ICD-10-CM | POA: Diagnosis not present

## 2019-03-02 DIAGNOSIS — I4811 Longstanding persistent atrial fibrillation: Secondary | ICD-10-CM | POA: Diagnosis not present

## 2019-03-02 DIAGNOSIS — Z79899 Other long term (current) drug therapy: Secondary | ICD-10-CM | POA: Diagnosis not present

## 2019-03-02 DIAGNOSIS — S72499A Other fracture of lower end of unspecified femur, initial encounter for closed fracture: Secondary | ICD-10-CM | POA: Diagnosis present

## 2019-03-02 DIAGNOSIS — Z96643 Presence of artificial hip joint, bilateral: Secondary | ICD-10-CM | POA: Diagnosis present

## 2019-03-02 DIAGNOSIS — E669 Obesity, unspecified: Secondary | ICD-10-CM | POA: Diagnosis present

## 2019-03-02 DIAGNOSIS — S72491D Other fracture of lower end of right femur, subsequent encounter for closed fracture with routine healing: Secondary | ICD-10-CM | POA: Diagnosis not present

## 2019-03-02 DIAGNOSIS — G4730 Sleep apnea, unspecified: Secondary | ICD-10-CM | POA: Diagnosis not present

## 2019-03-02 DIAGNOSIS — Z7901 Long term (current) use of anticoagulants: Secondary | ICD-10-CM | POA: Diagnosis not present

## 2019-03-02 DIAGNOSIS — I472 Ventricular tachycardia: Secondary | ICD-10-CM | POA: Diagnosis present

## 2019-03-02 DIAGNOSIS — K8020 Calculus of gallbladder without cholecystitis without obstruction: Secondary | ICD-10-CM | POA: Diagnosis present

## 2019-03-02 DIAGNOSIS — I4821 Permanent atrial fibrillation: Secondary | ICD-10-CM | POA: Diagnosis not present

## 2019-03-02 DIAGNOSIS — I4891 Unspecified atrial fibrillation: Secondary | ICD-10-CM | POA: Diagnosis not present

## 2019-03-02 DIAGNOSIS — M1612 Unilateral primary osteoarthritis, left hip: Secondary | ICD-10-CM | POA: Diagnosis present

## 2019-03-02 DIAGNOSIS — I10 Essential (primary) hypertension: Secondary | ICD-10-CM | POA: Diagnosis present

## 2019-03-02 DIAGNOSIS — Z86718 Personal history of other venous thrombosis and embolism: Secondary | ICD-10-CM | POA: Diagnosis not present

## 2019-03-02 DIAGNOSIS — Y92008 Other place in unspecified non-institutional (private) residence as the place of occurrence of the external cause: Secondary | ICD-10-CM | POA: Diagnosis not present

## 2019-03-02 DIAGNOSIS — G4733 Obstructive sleep apnea (adult) (pediatric): Secondary | ICD-10-CM | POA: Diagnosis present

## 2019-03-02 LAB — CBC
HCT: 44 % (ref 39.0–52.0)
Hemoglobin: 14.3 g/dL (ref 13.0–17.0)
MCH: 34.3 pg — ABNORMAL HIGH (ref 26.0–34.0)
MCHC: 32.5 g/dL (ref 30.0–36.0)
MCV: 105.5 fL — ABNORMAL HIGH (ref 80.0–100.0)
Platelets: 155 10*3/uL (ref 150–400)
RBC: 4.17 MIL/uL — ABNORMAL LOW (ref 4.22–5.81)
RDW: 12.8 % (ref 11.5–15.5)
WBC: 6.2 10*3/uL (ref 4.0–10.5)
nRBC: 0 % (ref 0.0–0.2)

## 2019-03-02 LAB — BASIC METABOLIC PANEL
Anion gap: 8 (ref 5–15)
BUN: 11 mg/dL (ref 8–23)
CO2: 30 mmol/L (ref 22–32)
Calcium: 8.8 mg/dL — ABNORMAL LOW (ref 8.9–10.3)
Chloride: 101 mmol/L (ref 98–111)
Creatinine, Ser: 0.96 mg/dL (ref 0.61–1.24)
GFR calc Af Amer: >60 mL/min (ref >60)
GFR calc non Af Amer: >60 mL/min (ref >60)
Glucose, Bld: 126 mg/dL — ABNORMAL HIGH (ref 70–99)
Potassium: 4.3 mmol/L (ref 3.5–5.1)
Sodium: 139 mmol/L (ref 135–145)

## 2019-03-02 MED ORDER — ADULT MULTIVITAMIN W/MINERALS CH
1.0000 | ORAL_TABLET | Freq: Every day | ORAL | Status: DC
  Administered 2019-03-02 – 2019-03-11 (×10): 1 via ORAL
  Filled 2019-03-02 (×10): qty 1

## 2019-03-02 MED ORDER — ENSURE ENLIVE PO LIQD
237.0000 mL | Freq: Three times a day (TID) | ORAL | Status: DC
  Administered 2019-03-02 – 2019-03-11 (×20): 237 mL via ORAL

## 2019-03-02 NOTE — Progress Notes (Signed)
PROGRESS NOTE    Steven Blair  OAC:[id number] DOB: 1946-01-17 DOA: 03/01/2019 PCP: Josetta Huddle, MD   Brief Narrative:  HPI: Steven Blair is a 74 y.o. Blair with medical history significant of atrial fibrillation, nephrolithiasis, sleep apnea9 improved with weight loss) ulcerative colitis who presents to the emergency department after slipping and falling on black ice this morning.  He had immediate right hip pain and could not get up.  He came to the ED where CT scan showed an acute fracture of the lesser trochanter.  He also states that when he went to get his Covid vaccine last Friday stood in line a long time and developed left hip pain for which she was going to speak with the orthopedist today.  Unfortunately T scan showed a fracture of the lesser trochanter.  Orthopedic surgery saw the patient and felt that conservative management with physical therapy and pain control was in order and referred the patient to [place].  They plan to evaluate the patient in a couple weeks as an outpatient and consider appropriate timing for left hip replacement which needs to be done.                      ED Course: CT scan showed fracture of the right lesser trochanter.  Persistent pain of the left hip associated with chronic arthritis.  Developed hematuria in the emergency department.  CT scan shows nephrolithiasis without obstructing stone.  Patient is anticoagulated due to atrial fibrillation.  Assessment & Plan:   Active Problems:   Sleep apnea   Hypertension   Left hip pain   Atrial fibrillation (HCC)   Closed avulsion fracture of lesser trochanter of femur, right, initial encounter ([place])   Hematuria   Nephrolithiasis   Cholelithiasis   1.  Closed avulsion fracture of the lesser trochanter of the right femur, right: Seen by orthopedics.  No surgical intervention recommended.  They recommended continuing current pain management and PT assessment.  Patient complains of significant pain and muscle  spasm.  Continue Robaxin and morphine as needed.  Seen by PT and OT and they recommended CIR versus SNF.  TOC consulted.  2.  Hematuria/nephrolithiasis: Hematuria not related to acute nephrolithiasis but instead likely related to trauma that he had.  CT scan did not show any obstructing lesions.  Per patient, his urine is clearing up.  His anticoagulation is on hold.  We will hold it for tonight and reassess tomorrow.  3.  Left hip pain due to osteoarthritis: Patient to see orthopedics as an outpatient once acute right hip injury improved.  4.  Paroxysmal atrial fibrillation: On diltiazem.  Rate controlled.  Continue to hold Eliquis due to hematuria.  5.  History of sleep apnea: Patient used to have a BMI of 45.  He is now down to 35.  Continue to monitor for sleep apnea.  Request patient have his machine brought in from home.  6.    Essential hypertension: Patient has lost a lot of weight and since then blood pressures have improved.  He currently takes a thiazide which has been continued and his blood pressure is fairly stable.  DVT prophylaxis: Lovenox Code Status: Full code Family Communication:  None present at bedside.  Plan of care discussed with patient in length and he verbalized understanding and agreed with it. Patient is from: Home Disposition Plan: CIR vs SNF Barriers to discharge: Patient with excruciating pain in the right lower extremity causing inability to bear  weight and unsteadiness.  Unsafe to be discharged.  PT OT recommends continuing PT OT sessions here to further assess disposition plan to either CIR or SNF.   Estimated body mass index is 35.49 kg/m as calculated from the following:   Height as of this encounter: 6' (1.829 m).   Weight as of this encounter: 118.7 kg.      Nutritional status:               Consultants:   Orthopedics  Procedures:   None  Antimicrobials:   None   Subjective: Seen and examined.  Complains of severe pain  in the right lateral upper thigh.  Muscle spasms.  Some improvement with pain medication.  Objective: Vitals:   03/01/19 2204 03/02/19 0543 03/02/19 1004 03/02/19 1219  BP: 114/67 137/81 121/74 110/64  Pulse: 93 90  90  Resp: 18 16    Temp: 97.9 F (36.6 C) 98.1 F (36.7 C)  98.8 F (37.1 C)  TempSrc: Oral Oral    SpO2: 98% 99%  96%  Weight:      Height:        Intake/Output Summary (Last 24 hours) at 03/02/2019 1356 Last data filed at 03/02/2019 1141 Gross per 24 hour  Intake 840 ml  Output 2100 ml  Net -1260 ml   Filed Weights   03/01/19 0743 03/01/19 0747 03/01/19 1830  Weight: 120.3 kg 115.7 kg 118.7 kg    Examination:  General exam: Appears calm and comfortable  Respiratory system: Clear to auscultation. Respiratory effort normal. Cardiovascular system: S1 & S2 heard, irregularly regular rate and rhythm. No JVD, murmurs, rubs, gallops or clicks. No pedal edema. Gastrointestinal system: Abdomen is nondistended, soft and nontender. No organomegaly or masses felt. Normal bowel sounds heard. Central nervous system: Alert and oriented. No focal neurological deficits. Extremities: Tenderness at the right lateral upper thigh. Skin: No rashes, lesions or ulcers Psychiatry: Judgement and insight appear normal. Mood & affect appropriate.    Data Reviewed: I have personally reviewed following labs and imaging studies  CBC: Recent Labs  Lab 03/01/19 1211 03/02/19 0054  WBC 6.0 6.2  HGB 15.5 14.3  HCT 46.8 44.0  MCV 104.9* 105.5*  PLT 188 119   Basic Metabolic Panel: Recent Labs  Lab 03/01/19 1211 03/02/19 0054  NA 140 139  K 4.2 4.3  CL 103 101  CO2 25 30  GLUCOSE 126* 126*  BUN 15 11  CREATININE 1.01 0.96  CALCIUM 9.1 8.8*   GFR: Estimated Creatinine Clearance: 91.1 mL/min (by C-G formula based on SCr of 0.96 mg/dL). Liver Function Tests: No results for input(s): AST, ALT, ALKPHOS, BILITOT, PROT, ALBUMIN in the last 168 hours. No results for input(s):  LIPASE, AMYLASE in the last 168 hours. No results for input(s): AMMONIA in the last 168 hours. Coagulation Profile: No results for input(s): INR, PROTIME in the last 168 hours. Cardiac Enzymes: No results for input(s): CKTOTAL, CKMB, CKMBINDEX, TROPONINI in the last 168 hours. BNP (last 3 results) No results for input(s): PROBNP in the last 8760 hours. HbA1C: No results for input(s): HGBA1C in the last 72 hours. CBG: No results for input(s): GLUCAP in the last 168 hours. Lipid Profile: No results for input(s): CHOL, HDL, LDLCALC, TRIG, CHOLHDL, LDLDIRECT in the last 72 hours. Thyroid Function Tests: No results for input(s): TSH, T4TOTAL, FREET4, T3FREE, THYROIDAB in the last 72 hours. Anemia Panel: No results for input(s): VITAMINB12, FOLATE, FERRITIN, TIBC, IRON, RETICCTPCT in the last 72 hours. Sepsis Labs:  No results for input(s): PROCALCITON, LATICACIDVEN in the last 168 hours.  Recent Results (from the past 240 hour(s))  Respiratory Panel by RT PCR (Flu A&B, Covid) - Nasopharyngeal Swab     Status: None   Collection Time: 03/01/19  4:43 PM   Specimen: Nasopharyngeal Swab  Result Value Ref Range Status   SARS Coronavirus 2 by RT PCR NEGATIVE NEGATIVE Final    Comment: (NOTE) SARS-CoV-2 target nucleic acids are NOT DETECTED. The SARS-CoV-2 RNA is generally detectable in upper respiratoy specimens during the acute phase of infection. The lowest concentration of SARS-CoV-2 viral copies this assay can detect is 131 copies/mL. A negative result does not preclude SARS-Cov-2 infection and should not be used as the sole basis for treatment or other patient management decisions. A negative result may occur with  improper specimen collection/handling, submission of specimen other than nasopharyngeal swab, presence of viral mutation(s) within the areas targeted by this assay, and inadequate number of viral copies (<131 copies/mL). A negative result must be combined with  clinical observations, patient history, and epidemiological information. The expected result is Negative. Fact Sheet for Patients:  PinkCheek.be Fact Sheet for Healthcare Providers:  GravelBags.it This test is not yet ap proved or cleared by the [place] FDA and  has been authorized for detection and/or diagnosis of SARS-CoV-2 by FDA under an Emergency Use Authorization (EUA). This EUA will remain  in effect (meaning this test can be used) for the duration of the COVID-19 declaration under Section 564(b)(1) of the Act, 21 U.S.C. section 360bbb-3(b)(1), unless the authorization is terminated or revoked sooner.    Influenza A by PCR NEGATIVE NEGATIVE Final   Influenza B by PCR NEGATIVE NEGATIVE Final    Comment: (NOTE) The Xpert Xpress SARS-CoV-2/FLU/RSV assay is intended as an aid in  the diagnosis of influenza from Nasopharyngeal swab specimens and  should not be used as a sole basis for treatment. Nasal washings and  aspirates are unacceptable for Xpert Xpress SARS-CoV-2/FLU/RSV  testing. Fact Sheet for Patients: PinkCheek.be Fact Sheet for Healthcare Providers: GravelBags.it This test is not yet approved or cleared by the [place] FDA and  has been authorized for detection and/or diagnosis of SARS-CoV-2 by  FDA under an Emergency Use Authorization (EUA). This EUA will remain  in effect (meaning this test can be used) for the duration of the  Covid-19 declaration under Section 564(b)(1) of the Act, 21  U.S.C. section 360bbb-3(b)(1), unless the authorization is  terminated or revoked. Performed at [facility], [place] [address], [place], [place] [address]       Radiology Studies: CT ABDOMEN PELVIS W CONTRAST  Result Date: 03/01/2019 CLINICAL DATA:  Abdominal pain after fall today. EXAM: CT ABDOMEN AND PELVIS WITH CONTRAST TECHNIQUE: Multidetector CT  imaging of the abdomen and pelvis was performed using the standard protocol following bolus administration of intravenous contrast. CONTRAST:  19m OMNIPAQUE IOHEXOL 300 MG/ML  SOLN COMPARISON:  October 31, 2014. FINDINGS: Lower chest: No acute abnormality. Hepatobiliary: Minimal cholelithiasis is noted. No biliary dilatation is noted. The liver is unremarkable. Pancreas: Unremarkable. No pancreatic ductal dilatation or surrounding inflammatory changes. Spleen: Normal in size without focal abnormality. Adrenals/Urinary Tract: Adrenal glands appear normal. Left renal cyst is noted. Nonobstructive right nephrolithiasis is noted. Cortical scarring is noted in the right lower pole. No hydronephrosis or renal obstruction is noted. No ureteral calculi are noted. Urinary bladder is unremarkable. Stomach/Bowel: Stomach is within normal limits. Appendix appears normal. No evidence of bowel wall thickening,  distention, or inflammatory changes. Vascular/Lymphatic: Aortic atherosclerosis. No enlarged abdominal or pelvic lymph nodes. Reproductive: Prostate is unremarkable. Other: No abdominal wall hernia or abnormality. No abdominopelvic ascites. Musculoskeletal: Status post bilateral hip arthroplasty. No acute osseous abnormality is noted. IMPRESSION: 1. Minimal cholelithiasis. 2. Nonobstructive right nephrolithiasis. No hydronephrosis or renal obstruction is noted. 3. No acute abnormality seen in the abdomen or pelvis. Aortic Atherosclerosis (ICD10-I70.0). Electronically Signed   By: Marijo Conception M.D.   On: 03/01/2019 14:22   CT Hip Right Wo Contrast  Result Date: 03/01/2019 CLINICAL DATA:  Right hip pain after fall on ice this morning. EXAM: CT OF THE RIGHT HIP WITHOUT CONTRAST TECHNIQUE: Multidetector CT imaging of the right hip was performed according to the standard protocol. Multiplanar CT image reconstructions were also generated. COMPARISON:  Right hip x-rays from same day. CT abdomen pelvis dated October 31, 2014. FINDINGS: Bones/Joint/Cartilage Acute minimally displaced fracture of the right lesser trochanter, best appreciated on the coronal images. No additional fracture. No dislocation. Prior right total hip arthroplasty without hardware complication. Partially visualized left hip arthroplasty. No joint effusion. Ligaments Ligaments are suboptimally evaluated by CT. Muscles and Tendons Grossly intact. Mild gluteal muscle atrophy. Soft tissue No fluid collection or hematoma. No soft tissue mass. Mild prostatomegaly. Aortoiliac atherosclerotic vascular disease. IMPRESSION: 1. Acute minimally displaced fracture of the right lesser trochanter. 2. Right total hip arthroplasty without hardware complication. Electronically Signed   By: Titus Dubin M.D.   On: 03/01/2019 09:40   DG Hip Unilat W or Wo Pelvis 2-3 Views Right  Result Date: 03/01/2019 CLINICAL DATA:  74 year old Blair status post fall this morning in driveway. EXAM: DG HIP (WITH OR WITHOUT PELVIS) 2-3V RIGHT COMPARISON:  Left hip CT 10/18/2017. Left hip series 10/18/2017. FINDINGS: Chronic bilateral hip arthroplasty. Alignment of the right hip arthroplasty component appears stable, with no surrounding right hip region acute fracture identified. Visible left total hip arthroplasty also appears stable. No acute pelvis fracture identified. Negative visible bowel gas pattern. Pelvic phleboliths. IMPRESSION: Chronic bilateral hip arthroplasties. No acute fracture or dislocation identified about the right hip or pelvis. Electronically Signed   By: Genevie Ann M.D.   On: 03/01/2019 08:30    Scheduled Meds: . acetaminophen  650 mg Oral Q6H  . diltiazem  120 mg Oral Daily  . docusate sodium  100 mg Oral BID  . enoxaparin (LOVENOX) injection  30 mg Subcutaneous Q24H  . losartan  100 mg Oral Daily   And  . hydrochlorothiazide  25 mg Oral Daily  . mesalamine  2.4 g Oral Daily   Continuous Infusions: . 0.9 % NaCl with KCl 20 mEq / L 75 mL/hr at 03/02/19 0721   . methocarbamol (ROBAXIN) IV       LOS: 0 days   Time spent: 34 minutes   Darliss Cheney, MD [facility]  03/02/2019, 1:56 PM   To contact the attending provider between 7A-7P or the covering provider during after hours 7P-7A, please log into the web site [email or web address].

## 2019-03-02 NOTE — Social Work (Signed)
CSW acknowledging consult for SNF placement/DME/HH. Will follow for therapy recommendations needed to best determine disposition/for insurance authorization.   Westley Hummer, MSW, [place] Work

## 2019-03-02 NOTE — Progress Notes (Signed)
Initial Nutrition Assessment  RD working remotely.  DOCUMENTATION CODES:   Obesity unspecified  INTERVENTION:   -MVI with minerals daily -Ensure Enlive po TID, each supplement provides 350 kcal and 20 grams of protein -Magic cup TID with meals, each supplement provides 290 kcal and 9 grams of protein  NUTRITION DIAGNOSIS:   Increased nutrient needs related to hip fracture as evidenced by estimated needs.  GOAL:   Patient will meet greater than or equal to 90% of their needs  MONITOR:   PO intake, Supplement acceptance, Labs, Weight trends, Skin, I & O's  REASON FOR ASSESSMENT:   Consult Assessment of nutrition requirement/status  ASSESSMENT:   Steven Blair is a 74 y.o. male with medical history significant of atrial fibrillation, nephrolithiasis, sleep apnea9 improved with weight loss) ulcerative colitis who presents to the emergency department after slipping and falling on black ice this morning.  He had immediate right hip pain and could not get up.  He came to the ED where CT scan showed an acute fracture of the lesser trochanter.  He also states that when he went to get his Covid vaccine last Friday stood in line a long time and developed left hip pain for which she was going to speak with the orthopedist today.  Unfortunately T scan showed a fracture of the lesser trochanter.  Orthopedic surgery saw the patient and felt that conservative management with physical therapy and pain control was in order and referred the patient to [place].  They plan to evaluate the patient in a couple weeks as an outpatient and consider appropriate timing for left hip replacement which needs to be done.  Pt admitted with closed avulsion fracture of lesser rt trochanter of femur.   Per orthopedics notes, plan for conservative management with therapy and pain control.   Attempted to speak with pt via phone, however, no answer.   Per chart review, pt with significant pain. Noted poor appetite  per doc flosheets; meal completion documented at 15%.   Reviewed wt hx; noted pt has experienced a 3.3% wt loss over the past 3 months, which is not significant for time frame.   Medications reviewed and include cardizem, colace, and 0.9% NaCl with KCl 20 mEq/L infusion.   Therapies recommending CIR vs SNF.   Labs reviewed.   Diet Order:   Diet Order            Diet Heart Room service appropriate? Yes; Fluid consistency: Thin  Diet effective now              EDUCATION NEEDS:   No education needs have been identified at this time  Skin:  Skin Assessment: Reviewed RN Assessment  Last BM:  02/28/19  Height:   Ht Readings from Last 1 Encounters:  03/01/19 6' (1.829 m)    Weight:   Wt Readings from Last 1 Encounters:  03/01/19 118.7 kg    Ideal Body Weight:  80.9 kg  BMI:  Body mass index is 35.49 kg/m.  Estimated Nutritional Needs:   Kcal:  2200-2400  Protein:  120-135 grams  Fluid:  > 2.2 L    Steven Blair, RD, LDN, [place] Registered Dietitian II Certified Diabetes Care and Education Specialist Please refer to [facility] for RD and/or RD on-call/weekend/after hours pager

## 2019-03-02 NOTE — Evaluation (Addendum)
Occupational Therapy Evaluation Patient Details Name: Steven Blair MRN: [id number] DOB: 06/11/1945 Today's Date: 03/02/2019    History of Present Illness 74 yo admitted after fall on black ice with right trochanter fx managed conservatively. Pt also with left hip pain due to OA. Pt with hematuria in ED with nephrolithiasis. PMHx: bil THA, Afib, sleep apnea, ulcerative colitis   Clinical Impression   Patient presenting with decreased I in self care, balance, functional mobility, transfers, endurance, strength, knowledge of AD/AE, and safety awareness. Patient reports being independent PTA. Patient currently functioning at max A for bed mobility, mod A for sit <>stand but unable to take steps or transfer. Pt with 10/10 pain during evaluation with spasms as well. He lives in a 2 story home with wife and all bedrooms are on second level. Patient will benefit from acute OT to increase overall independence in the areas of ADLs, functional mobility, and safety awareness in order to safely discharge to next venue of care.     Follow Up Recommendations  CIR;Supervision/Assistance - 24 hour    Equipment Recommendations  Other (comment)(defer to next venue of care)    Recommendations for Other Services Rehab consult     Precautions / Restrictions Precautions Precautions: Fall Restrictions RLE Weight Bearing: Weight bearing as tolerated      Mobility Bed Mobility Overal bed mobility: Needs Assistance Bed Mobility: Supine to Sit;Sit to Supine     Supine to sit: Max assist Sit to supine: Max assist   General bed mobility comments: assist with truck and B LEs secondary to increase in pain with mobility  Transfers Overall transfer level: Needs assistance Equipment used: Rolling walker (2 wheeled) Transfers: Sit to/from Stand Sit to Stand: Mod assist         General transfer comment: mod lifting assistance    Balance Overall balance assessment: Needs assistance Sitting-balance  support: Feet supported Sitting balance-Leahy Scale: Good     Standing balance support: During functional activity Standing balance-Leahy Scale: Poor Standing balance comment: reliance on RW                           ADL either performed or assessed with clinical judgement   ADL Overall ADL's : Needs assistance/impaired Eating/Feeding: Independent   Grooming: Wash/dry hands;Wash/dry face;Oral care;Sitting;Set up   Upper Body Bathing: Set up;Sitting   Lower Body Bathing: Maximal assistance;Sitting/lateral leans   Upper Body Dressing : Set up;Sitting   Lower Body Dressing: Total assistance;Sit to/from stand                       Vision Baseline Vision/History: No visual deficits Patient Visual Report: No change from baseline              Pertinent Vitals/Pain Pain Assessment: 0-10 Pain Score: 10-Worst pain ever Pain Location: L hip Pain Descriptors / Indicators: Aching;Spasm;Discomfort;Grimacing;Guarding;Throbbing;Moaning Pain Intervention(s): Limited activity within patient's tolerance;Monitored during session;Premedicated before session;Repositioned;RN gave pain meds during session     Hand Dominance Right   Extremity/Trunk Assessment Upper Extremity Assessment Upper Extremity Assessment: Overall WFL for tasks assessed   Lower Extremity Assessment Lower Extremity Assessment: Defer to PT evaluation   Cervical / Trunk Assessment Cervical / Trunk Assessment: Normal   Communication Communication Communication: No difficulties   Cognition Arousal/Alertness: Awake/alert Behavior During Therapy: WFL for tasks assessed/performed Overall Cognitive Status: Within Functional Limits for tasks assessed  Home Living Family/patient expects to be discharged to:: Private residence Living Arrangements: Spouse/significant other Available Help at Discharge: Family Type of Home: House Home Access: Stairs to enter [employer] of Steps: 1   Home Layout: Two level;Able to live on main level with bedroom/bathroom(would need hospital bed as no bedrooms are downstairs) Alternate Level Stairs-Number of Steps: flight Alternate Level Stairs-Rails: Right Bathroom Shower/Tub: Occupational psychologist: Standard Bathroom Accessibility: Yes How Accessible: Accessible via wheelchair Home Equipment: Kramer - 2 wheels;Bedside commode;Cane - single point          Prior Functioning/Environment Level of Independence: Independent                 OT Problem List: Decreased strength;Pain;Decreased range of motion;Decreased activity tolerance;Decreased safety awareness;Impaired balance (sitting and/or standing);Decreased knowledge of precautions      OT Treatment/Interventions: Self-care/ADL training;Therapeutic exercise;Therapeutic activities;Energy conservation;DME and/or AE instruction;Patient/family education;Balance training    OT Goals(Current goals can be found in the care plan section) Acute Rehab OT Goals Patient Stated Goal: to get better OT Goal Formulation: With patient Time For Goal Achievement: 03/16/19 Potential to Achieve Goals: Fair ADL Goals Pt Will Perform Grooming: standing;with supervision Pt Will Perform Lower Body Bathing: with min assist;sit to/from stand Pt Will Perform Lower Body Dressing: with min assist;sit to/from stand Pt Will Transfer to Toilet: with min assist Pt Will Perform Toileting - Clothing Manipulation and hygiene: with min assist  OT Frequency: Min 2X/week   Barriers to D/C: Inaccessible home environment  bedrooms on second level          AM-PAC OT "6 Clicks" Daily Activity     Outcome Measure Help from another person eating meals?: None Help from another person taking care of personal grooming?: A Little Help from another person toileting, which includes using toliet, bedpan, or urinal?: Total Help from another person bathing (including washing,  rinsing, drying)?: A Lot Help from another person to put on and taking off regular upper body clothing?: A Little Help from another person to put on and taking off regular lower body clothing?: Total 6 Click Score: 14   End of Session Equipment Utilized During Treatment: Rolling walker Nurse Communication: Mobility status;Patient requests pain meds;Precautions  Activity Tolerance: Patient limited by pain Patient left: in bed;with call bell/phone within reach;with bed alarm set  OT Visit Diagnosis: Unsteadiness on feet (R26.81);Muscle weakness (generalized) (M62.81);History of falling (Z91.81);Pain Pain - Right/Left: Right Pain - part of body: Leg;Hip                Time: 2671-2458 OT Time Calculation (min): 39 min Charges:  OT General Charges $OT Visit: 1 Visit OT Evaluation $OT Eval Moderate Complexity: 1 Mod OT Treatments $Therapeutic Activity: 23-37 mins   Gypsy Decant MS, OTR/L 03/02/2019, 12:17 PM

## 2019-03-02 NOTE — Evaluation (Signed)
Physical Therapy Evaluation Patient Details Name: Steven Blair MRN: [id number] DOB: 01-14-1946 Today's Date: 03/02/2019   History of Present Illness  74 yo admitted after fall on black ice with right trochanter fx managed conservatively. Pt also with left hip pain due to OA. Pt with hematuria in ED with nephrolithiasis. PMHx: bil THA, Afib, sleep apnea, ulcerative colitis  Clinical Impression  Pt supine on arrival and able to report he did get to standing with OT. Pt received morphine and robaxin prior to session but despite medication continued to have significant hip flexor and quad spasms of RLE with even the slightest movement of right hip or knee and unable to tolerate progression to bed level transfers or HEP. Wife present end of session and discussed potential rehab options at D/C. Both would prefer CIR if pt can improve function with pain control but both are agreeable to SNF should pt not progress quickly. Pt with decreased strength, ROM and function severely limited by spasm/pain this session and will benefit from acute therapy to maximize mobility and independence to decrease burden of care.     Follow Up Recommendations CIR;SNF;Supervision/Assistance - 24 hour(pending activity progression and pain tolerance)    Equipment Recommendations  Hospital bed    Recommendations for Other Services       Precautions / Restrictions Precautions Precautions: Fall Restrictions Weight Bearing Restrictions: Yes RLE Weight Bearing: Weight bearing as tolerated      Mobility  Bed Mobility Overal bed mobility: Needs Assistance Bed Mobility: Supine to Sit;Sit to Supine       General bed mobility comments: unable to perform any transition toward rolling or EOB due to spasm and pain. Attempted to have pt even prop on elbows in bed but pt again with spasm and unable to tolerate  Transfers  Ambulation/Gait                Stairs            Wheelchair Mobility    Modified  Rankin (Stroke Patients Only)       Balance                              Pertinent Vitals/Pain Pain Assessment: 0-10 Pain Score: 10-Worst pain ever Pain Location: L hip Pain Descriptors / Indicators: Aching;Spasm;Grimacing;Guarding;Moaning Pain Intervention(s): Limited activity within patient's tolerance;Monitored during session;Premedicated before session;Repositioned    Home Living Family/patient expects to be discharged to:: Private residence Living Arrangements: Spouse/significant other Available Help at Discharge: Family Type of Home: House Home Access: Stairs to enter   Technical brewer of Steps: 1 Home Layout: Two level;Bed/bath upstairs(would need hospital bed as no bedrooms are downstairs) Home Equipment: Environmental consultant - 2 wheels;Bedside commode;Cane - single point      Prior Function Level of Independence: Independent               Hand Dominance   Dominant Hand: Right    Extremity/Trunk Assessment   Upper Extremity Assessment Upper Extremity Assessment: Defer to OT evaluation    Lower Extremity Assessment Lower Extremity Assessment: LLE deficits/detail;RLE deficits/detail RLE Deficits / Details: pt able to tolerate grossly 20 degrees of knee flexion, no movement of hip abduct/Add due to pain and spasm with movement LLE Deficits / Details: limited to grossly 20 degrees of hip flexion and very limited aBdct/ADD due to spasm in right thigh    Cervical / Trunk Assessment Cervical / Trunk Assessment: Normal  Communication  Communication: No difficulties  Cognition Arousal/Alertness: Awake/alert Behavior During Therapy: WFL for tasks assessed/performed Overall Cognitive Status: Within Functional Limits for tasks assessed                                        General Comments      Exercises     Assessment/Plan    PT Assessment Patient needs continued PT services  PT Problem List Decreased strength;Decreased  mobility;Decreased safety awareness;Decreased activity tolerance;Decreased balance;Pain;Decreased knowledge of use of DME;Decreased range of motion       PT Treatment Interventions DME instruction;Therapeutic exercise;Gait training;Balance training;Stair training;Functional mobility training;Therapeutic activities;Patient/family education    PT Goals (Current goals can be found in the Care Plan section)  Acute Rehab PT Goals Patient Stated Goal: to be able to walk and go home PT Goal Formulation: With patient/family Time For Goal Achievement: 03/16/19 Potential to Achieve Goals: Fair    Frequency Min 5X/week   Barriers to discharge Decreased caregiver support      Co-evaluation               AM-PAC PT "6 Clicks" Mobility  Outcome Measure Help needed turning from your back to your side while in a flat bed without using bedrails?: Total Help needed moving from lying on your back to sitting on the side of a flat bed without using bedrails?: Total Help needed moving to and from a bed to a chair (including a wheelchair)?: Total Help needed standing up from a chair using your arms (e.g., wheelchair or bedside chair)?: Total Help needed to walk in hospital room?: Total Help needed climbing 3-5 steps with a railing? : Total 6 Click Score: 6    End of Session   Activity Tolerance: Patient limited by pain Patient left: in bed;with call bell/phone within reach;with bed alarm set;with family/visitor present Nurse Communication: Mobility status PT Visit Diagnosis: Other abnormalities of gait and mobility (R26.89);History of falling (Z91.81);Muscle weakness (generalized) (M62.81);Pain Pain - Right/Left: Right Pain - part of body: Hip    Time: 6283-6629 PT Time Calculation (min) (ACUTE ONLY): 24 min   Charges:   PT Evaluation $PT Eval Moderate Complexity: 1 Mod          Venera Privott P, PT Acute Rehabilitation Services Pager: [phone] Office: [phone]   Sandy Salaam  Quinnlan Abruzzo 03/02/2019, 12:55 PM

## 2019-03-03 ENCOUNTER — Encounter (HOSPITAL_COMMUNITY): Payer: Self-pay | Admitting: Family Medicine

## 2019-03-03 DIAGNOSIS — I10 Essential (primary) hypertension: Secondary | ICD-10-CM

## 2019-03-03 DIAGNOSIS — D696 Thrombocytopenia, unspecified: Secondary | ICD-10-CM

## 2019-03-03 DIAGNOSIS — I4821 Permanent atrial fibrillation: Secondary | ICD-10-CM

## 2019-03-03 DIAGNOSIS — I4891 Unspecified atrial fibrillation: Secondary | ICD-10-CM

## 2019-03-03 DIAGNOSIS — I472 Ventricular tachycardia: Secondary | ICD-10-CM

## 2019-03-03 LAB — BASIC METABOLIC PANEL
Anion gap: 8 (ref 5–15)
BUN: 12 mg/dL (ref 8–23)
CO2: 28 mmol/L (ref 22–32)
Calcium: 8.6 mg/dL — ABNORMAL LOW (ref 8.9–10.3)
Chloride: 101 mmol/L (ref 98–111)
Creatinine, Ser: 0.82 mg/dL (ref 0.61–1.24)
GFR calc Af Amer: >60 mL/min (ref >60)
GFR calc non Af Amer: >60 mL/min (ref >60)
Glucose, Bld: 103 mg/dL — ABNORMAL HIGH (ref 70–99)
Potassium: 3.9 mmol/L (ref 3.5–5.1)
Sodium: 137 mmol/L (ref 135–145)

## 2019-03-03 LAB — CBC
HCT: 42.1 % (ref 39.0–52.0)
Hemoglobin: 13.7 g/dL (ref 13.0–17.0)
MCH: 34.3 pg — ABNORMAL HIGH (ref 26.0–34.0)
MCHC: 32.5 g/dL (ref 30.0–36.0)
MCV: 105.3 fL — ABNORMAL HIGH (ref 80.0–100.0)
Platelets: 141 10*3/uL — ABNORMAL LOW (ref 150–400)
RBC: 4 MIL/uL — ABNORMAL LOW (ref 4.22–5.81)
RDW: 12.7 % (ref 11.5–15.5)
WBC: 6.4 10*3/uL (ref 4.0–10.5)
nRBC: 0 % (ref 0.0–0.2)

## 2019-03-03 MED ORDER — ACETAMINOPHEN 500 MG PO TABS
1000.0000 mg | ORAL_TABLET | Freq: Three times a day (TID) | ORAL | Status: DC
  Administered 2019-03-03 – 2019-03-11 (×22): 1000 mg via ORAL
  Filled 2019-03-03 (×22): qty 2

## 2019-03-03 MED ORDER — APIXABAN 5 MG PO TABS
5.0000 mg | ORAL_TABLET | Freq: Two times a day (BID) | ORAL | Status: DC
  Administered 2019-03-03 – 2019-03-11 (×16): 5 mg via ORAL
  Filled 2019-03-03 (×16): qty 1

## 2019-03-03 MED ORDER — DILTIAZEM HCL ER COATED BEADS 180 MG PO CP24
180.0000 mg | ORAL_CAPSULE | Freq: Every day | ORAL | Status: DC
  Administered 2019-03-04 – 2019-03-05 (×2): 180 mg via ORAL
  Filled 2019-03-03 (×3): qty 1

## 2019-03-03 NOTE — Progress Notes (Signed)
Rehab Admissions Coordinator Note:  Patient was screened by Cleatrice Burke for appropriateness for an Inpatient Acute Rehab Consult per PT and OT recs.  At this time, we are recommending Inpatient Rehab consult. I will place order per protocol.  Cleatrice Burke RN MSN 03/03/2019, 9:17 AM  I can be reached at [phone].

## 2019-03-03 NOTE — Care Management (Signed)
    Durable Medical Equipment  (From admission, onward)         Start     Ordered   03/03/19 0850  For home use only DME Hospital bed  Once    Question Answer Comment  Length of Need 6 Months   Patient has (list medical condition): Closed avulsion fracture   The above medical condition requires: Patient requires the ability to reposition frequently   Head must be elevated greater than: 45 degrees   Bed type Semi-electric   Support Surface: Gel Overlay      03/03/19 0850

## 2019-03-03 NOTE — Progress Notes (Signed)
Physical Therapy Treatment Patient Details Name: Steven Blair MRN: [id number] DOB: 1945/07/12 Today's Date: 03/03/2019    History of Present Illness 74 yo admitted after fall on black ice with right trochanter fx managed conservatively. Pt also with left hip pain due to OA. Pt with hematuria in ED with nephrolithiasis. PMHx: bil THA, Afib, sleep apnea, ulcerative colitis    PT Comments    Pt in bed upon PT arrival, agreeable to PT session with focus on mobility progression. The pt continues to present with limitations in functional mobility and dynamic stability compared to his prior level of function and independence due to above dx. The pt was able to tolerate bed mobility, transfers, and short, lateral ambulation today, but remains significantly limited by RLE muscle spasms that come randomly causing sig LOB requiring assist to regain balance. This occurred multiple times through the session resulting in posterior LOB in sitting as well as multiple episodes of RLE buckling in stance. The pt will continue to benefit from skilled PT to reduce assistance needed for bed mobility (currently maxA) as well as stability for transfers and mobility.   Follow Up Recommendations  CIR;SNF;Supervision/Assistance - 24 hour(pt currently is "not sure if he needs CIR therapies" as he is more concerned about pain management than rehab. However, pt did not use morphine today prior to session. Will continue to assess progress.)     Equipment Recommendations  Hospital bed    Recommendations for Other Services       Precautions / Restrictions Precautions Precautions: Fall Precaution Comments: unpredictable RLE muscle spasms increase fall risk Restrictions Weight Bearing Restrictions: Yes RLE Weight Bearing: Weight bearing as tolerated    Mobility  Bed Mobility Overal bed mobility: Needs Assistance Bed Mobility: Supine to Sit;Sit to Supine     Supine to sit: Max assist Sit to supine: Max assist    General bed mobility comments: Pt with good initiation of BLE, but needs mod/maxA for BLE due to pain and was unable to reach for bed rails but asking PT to assist with trunk movement requiring maxA to come to sitting EOB with strong post lean and multiple posterior LOB due to sudden onset of pain from spasms in RLE.  Transfers Overall transfer level: Needs assistance Equipment used: Rolling walker (2 wheeled) Transfers: Sit to/from Stand Sit to Stand: Min assist         General transfer comment: minA for rising from bed in addition to RW and VCs for hand placement  Ambulation/Gait Ambulation/Gait assistance: Min assist Gait Distance (Feet): 5 Feet Assistive device: Rolling walker (2 wheeled) Gait Pattern/deviations: Step-to pattern;Decreased weight shift to right;Shuffle Gait velocity: decreased Gait velocity interpretation: <1.31 ft/sec, indicative of household ambulator General Gait Details: Pt with 5 ft lateral stepping to R with RW and minA to control frequent buckling of RLE with wt bearing during stepping. The pt was reliant on BUE to maintain upright, but unable to support himself enough to catch himself or support himself during sudden onset of muscle spasms. HR increased to 160s with short ambulation   Stairs             Wheelchair Mobility    Modified Rankin (Stroke Patients Only)       Balance Overall balance assessment: Needs assistance Sitting-balance support: Feet supported Sitting balance-Leahy Scale: Fair Sitting balance - Comments: initially minA to maintain sitting even with BUE support due to frequent posterior LOB with sudden onset of RLE spasm. progressed to min guard/supervision   Standing  balance support: During functional activity;Bilateral upper extremity supported Standing balance-Leahy Scale: Poor Standing balance comment: reliant on RW and BUE support. No reactive stability or capacity to catch himself with sudden onset of pain                             Cognition Arousal/Alertness: Awake/alert Behavior During Therapy: WFL for tasks assessed/performed Overall Cognitive Status: Within Functional Limits for tasks assessed                                 General Comments: Pt with generally poor command following as it relates to sequencing for mobility and poor safety awareness/decision making. The pt was able to be redirected with multiple commands      Exercises      General Comments General comments (skin integrity, edema, etc.): HR to 160s with standing lateral stepping, pt reports it is due to anxiety/pain, HR returns to 110s-130s with seated rest      Pertinent Vitals/Pain Pain Assessment: Faces Faces Pain Scale: Hurts even more Pain Location: L hip Pain Descriptors / Indicators: Aching;Spasm;Grimacing;Guarding;Moaning Pain Intervention(s): Monitored during session;Limited activity within patient's tolerance;Repositioned    Home Living Family/patient expects to be discharged to:: Private residence Living Arrangements: Spouse/significant other                  Prior Function            PT Goals (current goals can now be found in the care plan section) Acute Rehab PT Goals Patient Stated Goal: to be able to walk and go home PT Goal Formulation: With patient/family Time For Goal Achievement: 03/16/19 Potential to Achieve Goals: Fair Progress towards PT goals: Progressing toward goals    Frequency    Min 5X/week      PT Plan Current plan remains appropriate    Co-evaluation              AM-PAC PT "6 Clicks" Mobility   Outcome Measure  Help needed turning from your back to your side while in a flat bed without using bedrails?: A Lot Help needed moving from lying on your back to sitting on the side of a flat bed without using bedrails?: A Lot Help needed moving to and from a bed to a chair (including a wheelchair)?: A Lot Help needed standing up from a  chair using your arms (e.g., wheelchair or bedside chair)?: A Little Help needed to walk in hospital room?: A Lot   6 Click Score: 11    End of Session Equipment Utilized During Treatment: Gait belt Activity Tolerance: Patient limited by pain;Patient tolerated treatment well Patient left: in bed;with call bell/phone within reach;with bed alarm set;with family/visitor present Nurse Communication: Mobility status PT Visit Diagnosis: Other abnormalities of gait and mobility (R26.89);History of falling (Z91.81);Muscle weakness (generalized) (M62.81);Pain Pain - Right/Left: Right Pain - part of body: Hip     Time: 9798-9211 PT Time Calculation (min) (ACUTE ONLY): 46 min  Charges:  $Gait Training: 23-37 mins $Therapeutic Activity: 8-22 mins                     Karma Ganja, PT, DPT   Acute Rehabilitation Department Pager #: [phone]   Otho Bellows 03/03/2019, 3:29 PM

## 2019-03-03 NOTE — Consult Note (Addendum)
Cardiology Consultation:   Patient ID: Steven Blair MRN: [id number]; DOB: 1945-12-27  Admit date: 03/01/2019 Date of Consult: 03/03/2019  Primary Care Provider: Josetta Huddle, MD Primary Cardiologist: No primary care provider on file.  Primary Electrophysiologist:  None    Patient Profile:   Steven Blair is a 74 y.o. male with a hx of ulcerative colitis, HTN, HL, obesity, OSA, and PAF  who is being seen today for the evaluation of Afib RVR at the request of Dr. Lavella Lemons.  History of Present Illness:   Steven Blair is a 74 yo male with PMH noted above. He is recently followed in the Afib clinic. Reports he was initially diagnosed with Afib remotely in the setting of setting of hip surgery and acute DVT. Reported to have had no recurrence until presenting to his PCP for an annual physical where he was found to have an irregular pulse. He was started on coumadin and referred to the AF clinic. Reported he has a hx of OSA but does not currently use CPAP after his weight loss. Does admit to regular ETOH use. He was last seen in the office on 9/20 as a first visit and switched to Eliquis 60m BID. Also started on Diltiazem 1259mdaily. He underwent DCCV after 3 weeks of therapy. Unfortunately he did not maintain SR was was back in Afib at his follow up appt. Decision was made to pursue rate control. Echo 11/20 showed EF of 45-50% with LVH, severely dilated LA and mildly dilated RA.   He presented to the ED on 03/01/19 with right hip pain after slipping on black ice. CT scan showed an acute fracture of the lesser trochanter. He was seen by orthopedic surgery and felt appropriate for medical management with PT and pain control. He developed hematuria in the ED. Eliquis was held on admission for this reason. CT scan showed nephrolithiasis without obstructing stone. He was admitted to IM for further management. Has started working with PT. Yesterday was only able to sit up on the side of the bed, but today  he did get up and walked in the hallway. Had significant pain and spasm in the right leg. Telemetry shows Afib RVR with rates into the 160-170s at times. Did have runs of NSVT with return to Afib. He denies any palpations or shortness of breath with these episodes. Cardiology has been asked to evaluate.   Heart Pathway Score:     Past Medical History:  Diagnosis Date   BMI 40.0-44.9, adult (HCC)    Greater than 40 BMI   DVT of leg (deep venous thrombosis) (HCC)    left leg tx. Xarelto - no longer taking   Dysrhythmia    Atrial Fib x1 episode, cardioversion 4'16- NSR   Fracture of trochanter of right femur ([facility] [place]   History of kidney stones    x1 -20 yrs ago   Hypertension    Sleep apnea    no cpap used, couldn't tolerate   Ulcerative [place] ([facility] [place]   history of , some current issues at this time    Past Surgical History:  Procedure Laterality Date   CARDIOVERSION     tx Paroxysmal Atrial Fibrillation- converted to NSR   CARDIOVERSION N/A 11/06/2018   Procedure: CARDIOVERSION;  Surgeon: ScDonato HeinzMD;  [facility];  Service: Endoscopy;  Laterality: N/A;   COLONOSCOPY W/ POLYPECTOMY     FEMUR SURGERY Left    ORIF 10'05   FLEXIBLE SIGMOIDOSCOPY N/A 11/01/2014  Procedure: FLEXIBLE SIGMOIDOSCOPY;  Surgeon: Garlan Fair, MD;  Location: [facility];  Service: Endoscopy;  Laterality: N/A;   JOINT REPLACEMENT     BTHA     Home Medications:  Prior to Admission medications   Medication Sig Start Date End Date Taking? Authorizing Provider  acetaminophen (TYLENOL) 500 MG tablet Take 1,500 mg by mouth every 8 (eight) hours as needed for mild pain or moderate pain.    Yes [provider]  Cholecalciferol (VITAMIN D3) 50 MCG (2000 UT) capsule Take 2,000 Units by mouth daily.   Yes [provider]  diltiazem (CARDIZEM CD) 120 MG 24 hr capsule TAKE 1 CAPSULE BY MOUTH DAILY Patient taking differently: Take 120 mg by mouth daily.  01/27/19   Yes Fenton, Clint R, PA  ELIQUIS 5 MG TABS tablet TAKE 1 TABLET BY MOUTH 2 TIMES DAILY. Patient taking differently: Take 5 mg by mouth 2 (two) times daily.  02/05/19  Yes Fenton, Clint R, PA  losartan-hydrochlorothiazide (HYZAAR) 100-25 MG tablet Take 1 tablet by mouth daily.  09/12/18  Yes [provider]  mesalamine (LIALDA) 1.2 g EC tablet Take 2.4 g by mouth daily. 02/01/19  Yes [provider]    Inpatient Medications: Scheduled Meds:  acetaminophen  650 mg Oral Q6H   diltiazem  120 mg Oral Daily   docusate sodium  100 mg Oral BID   feeding supplement (ENSURE ENLIVE)  237 mL Oral TID BM   losartan  100 mg Oral Daily   And   hydrochlorothiazide  25 mg Oral Daily   mesalamine  2.4 g Oral Daily   multivitamin with minerals  1 tablet Oral Daily   Continuous Infusions:  methocarbamol (ROBAXIN) IV     PRN Meds: methocarbamol **OR** methocarbamol (ROBAXIN) IV, morphine injection, oxyCODONE, zolpidem  Allergies:    Allergies  Allergen Reactions   Sulfa Antibiotics Other (See Comments)    Childhood allergy    Social History:   Social History   Socioeconomic History   Marital status: Married    Spouse name: Not on file   Number of children: Not on file   Years of education: Not on file   Highest education level: Not on file  Occupational History   Not on file  Tobacco Use   Smoking status: Never Smoker   Smokeless tobacco: Never Used  Substance and Sexual Activity   Alcohol use: Yes    Alcohol/week: 3.0 standard drinks    Types: 1 Glasses of wine, 2 Cans of beer per week    Comment: weekend   Drug use: No   Sexual activity: Not on file  Other Topics Concern   Not on file  Social History Narrative   Not on file   Social Determinants of Health   Financial Resource Strain:    Difficulty of Paying Living Expenses: Not on file  Food Insecurity:    Worried About  [place] in the Last Year: Not on file   Ran Out of Food in the Last Year:  Not on file  Transportation Needs:    Lack of Transportation (Medical): Not on file   Lack of Transportation (Non-Medical): Not on file  Physical Activity:    Days of Exercise per Week: Not on file   Minutes of Exercise per Session: Not on file  Stress:    Feeling of Stress : Not on file  Social Connections:    Frequency of Communication with Friends and Family: Not on file  Frequency of Social Gatherings with Friends and Family: Not on file   Attends Religious Services: Not on file   Active Member of Clubs or Organizations: Not on file   Attends Archivist Meetings: Not on file   Marital Status: Not on file  Intimate Partner Violence:    Fear of Current or Ex-Partner: Not on file   Emotionally Abused: Not on file   Physically Abused: Not on file   Sexually Abused: Not on file    Family History:    Family History  Problem Relation Age of Onset   Lung cancer Brother      ROS:  Please see the history of present illness.   All other ROS reviewed and negative.     Physical Exam/Data:   Vitals:   03/02/19 2121 03/03/19 0557 03/03/19 1111 03/03/19 1428  BP: 110/62 128/79 125/62   Pulse: 90 91 (!) 103 95  Resp: 18 18 16    Temp: 99.1 F (37.3 C) 98.4 F (36.9 C) 98.6 F (37 C)   TempSrc: Oral Oral Oral   SpO2: 98% 99% 96%   Weight:      Height:        Intake/Output Summary (Last 24 hours) at 03/03/2019 1441 Last data filed at 03/03/2019 0300 Gross per 24 hour  Intake 1974.12 ml  Output 500 ml  Net 1474.12 ml   Last 3 Weights 03/01/2019 03/01/2019 03/01/2019  Weight (lbs) 261 lb 11 oz 255 lb 265 lb 3.4 oz  Weight (kg) 118.7 kg 115.667 kg 120.3 kg     Body mass index is 35.49 kg/m.  General:  Well nourished, well developed, in no acute distress HEENT: normal Neck: no JVD Endocrine:  No thryomegaly Vascular: No carotid bruits; FA pulses 2+ bilaterally without bruits  Cardiac:  normal S1, S2; Irreg Irreg; no murmur  Lungs:  clear to auscultation  bilaterally, no wheezing, rhonchi or rales  Abd: soft, nontender, no hepatomegaly  Ext: no edema Musculoskeletal:  No deformities, BUE and BLE strength normal and equal Skin: warm and dry  Neuro:  CNs 2-12 intact, no focal abnormalities noted Psych:  Normal affect   EKG:  The EKG was personally reviewed and demonstrates:  Afib 94bpm Telemetry:  Telemetry was personally reviewed and demonstrates:  Afib RVR, several runs of NSVT with return to Afib.   Relevant CV Studies:  TTE: 11/20  IMPRESSIONS     1. Left ventricular ejection fraction, by visual estimation, is 45 to  50%. The left ventricle has mildly decreased function. There is mildly  increased left ventricular hypertrophy.   2. Left ventricular diastolic parameters are indeterminate.   3. Global right ventricle has normal systolic function.The right  ventricular size is mildly enlarged. No increase in right ventricular wall  thickness.   4. Left atrial size was severely dilated.   5. Right atrial size was mildly dilated.   6. The mitral valve is normal in structure. No evidence of mitral valve  regurgitation.   7. The tricuspid valve is normal in structure. Tricuspid valve  regurgitation is not demonstrated.   8. The aortic valve is tricuspid. Aortic valve regurgitation is not  visualized. No evidence of aortic valve sclerosis or stenosis.   9. The pulmonic valve was not well visualized. Pulmonic valve  regurgitation is not visualized.  10. There is mild dilatation of the aortic root measuring 40 mm.  11. The inferior vena cava is normal in size with greater than 50%  respiratory variability, suggesting  right atrial pressure of 3 mmHg.   Laboratory Data:  High Sensitivity Troponin:  No results for input(s): TROPONINIHS in the last 720 hours.   Chemistry Recent Labs  Lab 03/01/19 1211 03/02/19 0054 03/03/19 0237  NA 140 139 137  K 4.2 4.3 3.9  CL 103 101 101  CO2 25 30 28   GLUCOSE 126* 126* 103*  BUN 15 11 12     CREATININE 1.01 0.96 0.82  CALCIUM 9.1 8.8* 8.6*  GFRNONAA >60 >60 >60  GFRAA >60 >60 >60  ANIONGAP 12 8 8     No results for input(s): PROT, ALBUMIN, AST, ALT, ALKPHOS, BILITOT in the last 168 hours. Hematology Recent Labs  Lab 03/01/19 1211 03/02/19 0054 03/03/19 0237  WBC 6.0 6.2 6.4  RBC 4.46 4.17* 4.00*  HGB 15.5 14.3 13.7  HCT 46.8 44.0 42.1  MCV 104.9* 105.5* 105.3*  MCH 34.8* 34.3* 34.3*  MCHC 33.1 32.5 32.5  RDW 12.7 12.8 12.7  PLT 188 155 141*   BNPNo results for input(s): BNP, PROBNP in the last 168 hours.  DDimer No results for input(s): DDIMER in the last 168 hours.   Radiology/Studies:  CT ABDOMEN PELVIS W CONTRAST  Result Date: 03/01/2019 CLINICAL DATA:  Abdominal pain after fall today. EXAM: CT ABDOMEN AND PELVIS WITH CONTRAST TECHNIQUE: Multidetector CT imaging of the abdomen and pelvis was performed using the standard protocol following bolus administration of intravenous contrast. CONTRAST:  148m OMNIPAQUE IOHEXOL 300 MG/ML  SOLN COMPARISON:  October 31, 2014. FINDINGS: Lower chest: No acute abnormality. Hepatobiliary: Minimal cholelithiasis is noted. No biliary dilatation is noted. The liver is unremarkable. Pancreas: Unremarkable. No pancreatic ductal dilatation or surrounding inflammatory changes. Spleen: Normal in size without focal abnormality. Adrenals/Urinary Tract: Adrenal glands appear normal. Left renal cyst is noted. Nonobstructive right nephrolithiasis is noted. Cortical scarring is noted in the right lower pole. No hydronephrosis or renal obstruction is noted. No ureteral calculi are noted. Urinary bladder is unremarkable. Stomach/Bowel: Stomach is within normal limits. Appendix appears normal. No evidence of bowel wall thickening, distention, or inflammatory changes. Vascular/Lymphatic: Aortic atherosclerosis. No enlarged abdominal or pelvic lymph nodes. Reproductive: Prostate is unremarkable. Other: No abdominal wall hernia or abnormality. No  abdominopelvic ascites. Musculoskeletal: Status post bilateral hip arthroplasty. No acute osseous abnormality is noted. IMPRESSION: 1. Minimal cholelithiasis. 2. Nonobstructive right nephrolithiasis. No hydronephrosis or renal obstruction is noted. 3. No acute abnormality seen in the abdomen or pelvis. Aortic Atherosclerosis (ICD10-I70.0). Electronically Signed   By: JMarijo ConceptionM.D.   On: 03/01/2019 14:22   CT Hip Right Wo Contrast  Result Date: 03/01/2019 CLINICAL DATA:  Right hip pain after fall on ice this morning. EXAM: CT OF THE RIGHT HIP WITHOUT CONTRAST TECHNIQUE: Multidetector CT imaging of the right hip was performed according to the standard protocol. Multiplanar CT image reconstructions were also generated. COMPARISON:  Right hip x-rays from same day. CT abdomen pelvis dated October 31, 2014. FINDINGS: Bones/Joint/Cartilage Acute minimally displaced fracture of the right lesser trochanter, best appreciated on the coronal images. No additional fracture. No dislocation. Prior right total hip arthroplasty without hardware complication. Partially visualized left hip arthroplasty. No joint effusion. Ligaments Ligaments are suboptimally evaluated by CT. Muscles and Tendons Grossly intact. Mild gluteal muscle atrophy. Soft tissue No fluid collection or hematoma. No soft tissue mass. Mild prostatomegaly. Aortoiliac atherosclerotic vascular disease. IMPRESSION: 1. Acute minimally displaced fracture of the right lesser trochanter. 2. Right total hip arthroplasty without hardware complication. Electronically Signed   By: WTitus Dubin  M.D.   On: 03/01/2019 09:40   DG Hip Unilat W or Wo Pelvis 2-3 Views Right  Result Date: 03/01/2019 CLINICAL DATA:  74 year old male status post fall this morning in driveway. EXAM: DG HIP (WITH OR WITHOUT PELVIS) 2-3V RIGHT COMPARISON:  Left hip CT 10/18/2017. Left hip series 10/18/2017. FINDINGS: Chronic bilateral hip arthroplasty. Alignment of the right hip arthroplasty  component appears stable, with no surrounding right hip region acute fracture identified. Visible left total hip arthroplasty also appears stable. No acute pelvis fracture identified. Negative visible bowel gas pattern. Pelvic phleboliths. IMPRESSION: Chronic bilateral hip arthroplasties. No acute fracture or dislocation identified about the right hip or pelvis. Electronically Signed   By: Genevie Ann M.D.   On: 03/01/2019 08:30    Assessment and Plan:   DARYEL KENNETH is a 74 y.o. male with a hx of ulcerative colitis, HTN, HL, obesity, OSA, and PAF  who is being seen today for the evaluation of Afib RVR at the request of Dr. Lavella Lemons.  1. Afib RVR: seems this is more chronic Afib at this point. Attempted DCCV back in 10/20 but did not hold. Has a severe dilated LA. He has elected for rate control and says he is normally well controlled prior to admission. Rates on telemetry are elevated at times, suspect this is related to pain while working with PT.  -- will further increase Dilt to 166m daily -- Eliquis has been on hold, but hematuria is improving, to be resumed as appropriate once hematuria resolved.   2. NSVT: a few brief episodes noted on telemetry. EF noted at 45-50% on echo back 11/20. He is asymptomatic with these episodes. Will increase Dilt as above.  3. Right hip fracture: seen by ortho, being managed medically with PT and pain management.  4. Hematuria: noted on admission but now improving. Eliquis held   5. HTN: blood pressures are controlled with current regimen  For questions or updates, please contact CHurstbournePlease consult [email or web address] for contact info under   Signed, LReino Bellis NP  03/03/2019 2:41 PM  Agree with note by LReino BellisNP-C  We are asked to see Mr. RGlosterfor A. fib with RVR.  He is followed in our A. fib clinic.  He was admitted because of a fall injuring his right hip for physical therapy.  He is on Eliquis oral anticoagulation as well as  low-dose long-acting diltiazem.  He is fairly active at home and has heart rates in the 70-80 range and blood pressures in the 1332systolic and is otherwise asymptomatic from his A. fib.  He has mild LV dysfunction with left atrial enlargement.  On the monitor he has A. fib with heart rates up to 140 probably related to pain and physical therapy.  Exam is benign.  We will increase his long-acting diltiazem from 120 to 180 mg to facilitate rate control.     JLorretta Harp M.D., FTrommald FBaystate Medical Center FLaverta BaltimoreFGrantsburg[address] [place]lymouth [place]  [phone]/10/2019 4:18 PM

## 2019-03-03 NOTE — Progress Notes (Addendum)
PROGRESS NOTE   Steven Blair  FKC:[id number]    DOB: 1945/11/23    DOA: 03/01/2019  PCP: Josetta Huddle, MD   I have briefly reviewed patients previous medical records in [facility].  Chief Complaint:   Chief Complaint  Patient presents with  . Hip Pain    Brief Narrative:  74 year old male, lives with spouse, independent of daily activities, PMH of atrial fibrillation on Eliquis, nephrolithiasis, sleep apnea which improved after weight loss, ulcerative colitis, presented to the ED after slipping and falling on black ice on day of admission, immediately developed right hip pain and was unable to get up.  In the ED CT showed right hip acute fracture of the lesser trochanter.  Orthopedic surgery consulted and recommended conservative/nonoperative management with therapies and pain control and outpatient follow-up in a couple of weeks.  Developed hematuria in ED which has resolved.  Hospital course complicated by periodic A. fib with RVR and NSVT, cardiology consulted 2/10.  CIR evaluating.   Assessment & Plan:  Active Problems:   Sleep apnea   Hypertension   Left hip pain   Atrial fibrillation (HCC)   Closed avulsion fracture of lesser trochanter of femur, right, initial encounter ([place])   Hematuria   Nephrolithiasis   Cholelithiasis   Avulsion fracture of condyle of femur (HCC)   Closed evulsion fracture of lesser trochanter of right femur: Seen by orthopedics, recommend nonsurgical/conservative management with pain control and therapies evaluation.  Received almost around-the-clock oral opioids yesterday but today indicates that his pain is better.  Therapies recommended CIR versus SNF.  CIR on board and plan to follow his progress with therapy to determine whether he is appropriate for CIR versus SNF.  Changed Tylenol to 1 g 3 times daily scheduled.  Hematuria/nephrolithiasis: Patient denies prior history of hematuria.  Follows with Dr. Diona F[facility], [facility].  Etiology  of hematuria unclear,?  Related to nephrolithiasis versus trauma from fall.  CT did not show any obstructing lesions.  Per patient, hematuria has resolved.  Resume temporarily held apixaban and monitor closely.  Outpatient follow-up with Dr. Ambrose Pancoast was advised to patient who verbalized understanding.  Left hip osteoarthritis with pain: Reportedly has had both hips replaced by Dr. Shellia Carwin years ago.  Now follows with Dr. Alvan Dame, outpatient follow-up and considering repeat surgery.  Chronic A. fib with intermittent RVR: Noted RVR transiently up to 170s-140s this morning.  TTE showed LVEF 45-50% in November 2020.  Cardiology consulted, reportedly followed in their A. fib clinic.  They have increased his Cardizem CD from 120 to 180 mg daily.  Will resume apixaban today.  Failed DCCV in 10/2018.  NSVT: Has had a few brief episodes on telemetry, asymptomatic.  TTE as noted above.  Per cardiology, increase diltiazem and monitor.  History of sleep apnea: Reportedly had BMI of 45, now down to 35.  Continue CPAP.  Outpatient follow-up.  Essential hypertension: Reportedly improved after weight loss.  Continue diltiazem and HCTZ.  Thrombocytopenia: Unclear etiology.  Follow CBC in a.m.  Body mass index is 35.49 kg/m.  Obesity  Nutritional Status Nutrition Problem: Increased nutrient needs Etiology: hip fracture Signs/Symptoms: estimated needs Interventions: Ensure Enlive (each supplement provides 350kcal and 20 grams of protein), Magic cup, MVI  DVT prophylaxis: Resume Eliquis. Code Status: Full. Family Communication: None at bedside. Disposition:  . Patient came from: Home           . Anticipated d/c place: To be determined pending progress with therapies, CIR versus SNF. Marland Kitchen  Barriers to d/c: A. fib with intermittent RVR, NSVT, improvement from postop pain and determination by PT   Consultants:   Orthopedics. Cardiology.  Procedures:   None  Antimicrobials:   None   Subjective:   "I am turning the corner".  Reports that his gross hematuria has resolved.  Denies dysuria or penile pain.  Has not had this happen to him before.  Also feels that his right hip pain is better and feels like he can work with PT today.  No chest pain, dyspnea palpitations  Objective:   Vitals:   03/03/19 1111 03/03/19 1428 03/03/19 1516 03/03/19 1603  BP: 125/62     Pulse: (!) 103 95 (!) 115 86  Resp: 16     Temp: 98.6 F (37 C)     TempSrc: Oral     SpO2: 96%     Weight:      Height:        General exam: Elderly male, moderately built and obese lying comfortably supine in bed.  Oral mucosa moist. Respiratory system: Clear to auscultation. Respiratory effort normal. Cardiovascular system: S1 & S2 heard, irregularly irregular. No JVD, murmurs, rubs, gallops or clicks. No pedal edema.  Telemetry personally reviewed: Paroxysmal A. fib with mostly controlled ventricular rate but had brief episode of RVR up to 170s this morning.  Also noted a couple of episodes of nonsustained VT up to 25 bpm. Gastrointestinal system: Abdomen is nondistended, soft and nontender. No organomegaly or masses felt. Normal bowel sounds heard. Central nervous system: Alert and oriented. No focal neurological deficits. Extremities: Symmetric 5 x 5 power except right lower extremity movements restricted due to pain. Skin: No rashes, lesions or ulcers Psychiatry: Judgement and insight appear normal. Mood & affect appropriate.     Data Reviewed:   I have personally reviewed following labs and imaging studies   CBC: Recent Labs  Lab 03/01/19 1211 03/02/19 0054 03/03/19 0237  WBC 6.0 6.2 6.4  HGB 15.5 14.3 13.7  HCT 46.8 44.0 42.1  MCV 104.9* 105.5* 105.3*  PLT 188 155 141*    Basic Metabolic Panel: Recent Labs  Lab 03/01/19 1211 03/02/19 0054 03/03/19 0237  NA 140 139 137  K 4.2 4.3 3.9  CL 103 101 101  CO2 25 30 28   GLUCOSE 126* 126* 103*  BUN 15 11 12   CREATININE 1.01 0.96 0.82  CALCIUM  9.1 8.8* 8.6*    Liver Function Tests: No results for input(s): AST, ALT, ALKPHOS, BILITOT, PROT, ALBUMIN in the last 168 hours.  CBG: No results for input(s): GLUCAP in the last 168 hours.  Microbiology Studies:   Recent Results (from the past 240 hour(s))  Respiratory Panel by RT PCR (Flu A&B, Covid) - Nasopharyngeal Swab     Status: None   Collection Time: 03/01/19  4:43 PM   Specimen: Nasopharyngeal Swab  Result Value Ref Range Status   SARS Coronavirus 2 by RT PCR NEGATIVE NEGATIVE Final    Comment: (NOTE) SARS-CoV-2 target nucleic acids are NOT DETECTED. The SARS-CoV-2 RNA is generally detectable in upper respiratoy specimens during the acute phase of infection. The lowest concentration of SARS-CoV-2 viral copies this assay can detect is 131 copies/mL. A negative result does not preclude SARS-Cov-2 infection and should not be used as the sole basis for treatment or other patient management decisions. A negative result may occur with  improper specimen collection/handling, submission of specimen other than nasopharyngeal swab, presence of viral mutation(s) within the areas targeted by this assay,  and inadequate number of viral copies (<131 copies/mL). A negative result must be combined with clinical observations, patient history, and epidemiological information. The expected result is Negative. Fact Sheet for Patients:  PinkCheek.be Fact Sheet for Healthcare Providers:  GravelBags.it This test is not yet ap proved or cleared by the [place] FDA and  has been authorized for detection and/or diagnosis of SARS-CoV-2 by FDA under an Emergency Use Authorization (EUA). This EUA will remain  in effect (meaning this test can be used) for the duration of the COVID-19 declaration under Section 564(b)(1) of the Act, 21 U.S.C. section 360bbb-3(b)(1), unless the authorization is terminated or revoked sooner.    Influenza  A by PCR NEGATIVE NEGATIVE Final   Influenza B by PCR NEGATIVE NEGATIVE Final    Comment: (NOTE) The Xpert Xpress SARS-CoV-2/FLU/RSV assay is intended as an aid in  the diagnosis of influenza from Nasopharyngeal swab specimens and  should not be used as a sole basis for treatment. Nasal washings and  aspirates are unacceptable for Xpert Xpress SARS-CoV-2/FLU/RSV  testing. Fact Sheet for Patients: PinkCheek.be Fact Sheet for Healthcare Providers: GravelBags.it This test is not yet approved or cleared by the [place] FDA and  has been authorized for detection and/or diagnosis of SARS-CoV-2 by  FDA under an Emergency Use Authorization (EUA). This EUA will remain  in effect (meaning this test can be used) for the duration of the  Covid-19 declaration under Section 564(b)(1) of the Act, 21  U.S.C. section 360bbb-3(b)(1), unless the authorization is  terminated or revoked. Performed at [facility], [place] [address], [place], [place] [address]      Radiology Studies:  No results found.   Scheduled Meds:   . acetaminophen  650 mg Oral Q6H  . diltiazem  120 mg Oral Daily  . docusate sodium  100 mg Oral BID  . feeding supplement (ENSURE ENLIVE)  237 mL Oral TID BM  . losartan  100 mg Oral Daily   And  . hydrochlorothiazide  25 mg Oral Daily  . mesalamine  2.4 g Oral Daily  . multivitamin with minerals  1 tablet Oral Daily    Continuous Infusions:   . methocarbamol (ROBAXIN) IV       LOS: 1 day     Vernell Leep, MD, [place], [facility]. [facility]    To contact the attending provider between 7A-7P or the covering provider during after hours 7P-7A, please log into the web site [email or web address] and access using universal [place] password for that web site. If you do not have the password, please call the hospital operator.  03/03/2019, 4:44 PM

## 2019-03-03 NOTE — Progress Notes (Signed)
Inpatient Rehabilitation Admissions Coordinator  Inpatient rehab consult received. I have discussed case with Dr. Delice Lesch. I will follow his progress with therapy and further assess Medical neccesity for an inpt rehab admit vs SNF option.  Danne Baxter, RN, MSN Rehab Admissions Coordinator [phone] 03/03/2019 12:32 PM

## 2019-03-03 NOTE — Plan of Care (Signed)
  Problem: Education: Goal: Verbalization of understanding the information provided (i.e., activity precautions, restrictions, etc) will improve Outcome: Progressing Goal: Individualized Educational Video(s) Outcome: Progressing

## 2019-03-04 ENCOUNTER — Ambulatory Visit: Payer: No Typology Code available for payment source

## 2019-03-04 LAB — CBC
HCT: 43.3 % (ref 39.0–52.0)
Hemoglobin: 14.5 g/dL (ref 13.0–17.0)
MCH: 34.9 pg — ABNORMAL HIGH (ref 26.0–34.0)
MCHC: 33.5 g/dL (ref 30.0–36.0)
MCV: 104.3 fL — ABNORMAL HIGH (ref 80.0–100.0)
Platelets: 150 10*3/uL (ref 150–400)
RBC: 4.15 MIL/uL — ABNORMAL LOW (ref 4.22–5.81)
RDW: 12.3 % (ref 11.5–15.5)
WBC: 6.9 10*3/uL (ref 4.0–10.5)
nRBC: 0 % (ref 0.0–0.2)

## 2019-03-04 NOTE — Progress Notes (Signed)
PROGRESS NOTE   Steven Blair  WCB:[id number]    DOB: 1946/01/03    DOA: 03/01/2019  PCP: Josetta Huddle, MD   I have briefly reviewed patients previous medical records in [facility].  Chief Complaint:   Chief Complaint  Patient presents with  . Hip Pain    Brief Narrative:  74 year old male, lives with spouse, independent of daily activities, PMH of atrial fibrillation on Eliquis, nephrolithiasis, sleep apnea which improved after weight loss, ulcerative colitis, presented to the ED after slipping and falling on black ice on day of admission, immediately developed right hip pain and was unable to get up.  In the ED CT showed right hip acute fracture of the lesser trochanter.  Orthopedic surgery consulted and recommended conservative/nonoperative management with therapies and pain control and outpatient follow-up in a couple of weeks.  Developed hematuria in ED which has resolved.  Hospital course complicated by periodic A. fib with RVR and NSVT, cardiology consulted 2/10.  CIR evaluating.  Ongoing pain issues and suboptimally controlled A. fib, adjusted medications.   Assessment & Plan:  Active Problems:   Sleep apnea   Hypertension   Left hip pain   Atrial fibrillation (HCC)   Closed avulsion fracture of lesser trochanter of femur, right, initial encounter ([place])   Hematuria   Nephrolithiasis   Cholelithiasis   Avulsion fracture of condyle of femur (HCC)   Closed evulsion fracture of lesser trochanter of right femur: Seen by orthopedics, recommend nonsurgical/conservative management with pain control and therapies evaluation.  Continues to utilize almost around-the-clock oral opioids but today reports that pain is starting to improve, mobilizing better with PT today.  Therapies recommended CIR versus SNF.  CIR on board and plan to follow his progress with therapy to determine whether he is appropriate for CIR versus SNF.  Continue Tylenol to 1 g 3 times daily  scheduled.  Hematuria/nephrolithiasis: Patient denies prior history of hematuria.  Follows with Dr. Diona Fanti, [facility].  Etiology of hematuria unclear,?  Related to nephrolithiasis versus trauma from fall.  CT did not show any obstructing lesions.  Hematuria has resolved.  Resumed temporarily held apixaban and monitor closely.  Outpatient follow-up with Dr. Ambrose Pancoast was advised to patient who verbalized understanding.  No recurrence of hematuria.  Left hip osteoarthritis with pain: Reportedly has had both hips replaced by Dr. Shellia Carwin years ago.  Now follows with Dr. Alvan Dame, outpatient follow-up and considering repeat surgery.  Chronic A. fib with intermittent RVR: TTE showed LVEF 45-50% in November 2020.  Cardiology consulted, reportedly followed in their A. fib clinic.  They have increased his Cardizem CD from 120 to 180 mg daily, beginning increased dose today.  Resumed apixaban 2/10.  Failed DCCV in 10/2018.  A. fib seems to be reasonably controlled overnight and while at rest.  RVR mostly with activity, likely related by activity and pain.  We will see how he does with increased dose of Cardizem.  Continue telemetry.  NSVT: Has had a few brief episodes on telemetry, asymptomatic.  TTE as noted above.  Starting increased dose of Cardizem today.  Monitor.  History of sleep apnea: Reportedly had BMI of 45, now down to 35.  Continue CPAP.  Outpatient follow-up.  Essential hypertension: Reportedly improved after weight loss.  Continue diltiazem and HCTZ.  Thrombocytopenia: Unclear etiology.  Resolved today.  Body mass index is 35.49 kg/m.  Obesity  Nutritional Status Nutrition Problem: Increased nutrient needs Etiology: hip fracture Signs/Symptoms: estimated needs Interventions: Ensure Enlive (each supplement  provides 350kcal and 20 grams of protein), Magic cup, MVI  DVT prophylaxis: Resumed Eliquis. Code Status: Full. Family Communication: None at bedside.  Offered to speak  with family but patient declined and stated that he has been updating them. Disposition:  . Patient came from: Home           . Anticipated d/c place: To be determined pending progress with therapies, CIR versus SNF. Marland Kitchen Barriers to d/c: A. fib with intermittent RVR, NSVT, improvement from postop pain and determination by PT, hopefully should be ready for discharge 2/12.   Consultants:   Orthopedics. Cardiology.  Procedures:   None  Antimicrobials:   None   Subjective:  Patient reports that his pain is better.  Indicated that he sat on the edge of the bed by himself and then worked with PT.  No dizziness, lightheadedness or palpitations.  No dyspnea.  No hematuria.  Objective:   Vitals:   03/03/19 1603 03/03/19 1647 03/03/19 2143 03/04/19 0547  BP:  113/66 118/68 128/80  Pulse: 86 86 95 98  Resp:  18 16 16   Temp:   98.7 F (37.1 C) 98.8 F (37.1 C)  TempSrc:   Oral Oral  SpO2:  99% 97% 97%  Weight:      Height:        General exam: Elderly male, moderately built and obese sitting up comfortably in reclining chair this morning.  Oral mucosa moist. Respiratory system: Clear to auscultation.  No increased work of breathing. Cardiovascular system: S1 and S2 heard, irregularly irregular and mildly tachycardic.  No JVD, murmurs or pedal edema.  Telemetry personally reviewed: A. fib with controlled ventricular rate overnight in the 90s but this morning fluctuating in the 100s-occasionally up to 150s with activity. Gastrointestinal system: Abdomen is nondistended, soft and nontender. No organomegaly or masses felt. Normal bowel sounds heard. Central nervous system: Alert and oriented. No focal neurological deficits. Extremities: Symmetric 5 x 5 power except right lower extremity movements restricted due to pain. Skin: No rashes, lesions or ulcers Psychiatry: Judgement and insight appear normal. Mood & affect appropriate.     Data Reviewed:   I have personally reviewed following  labs and imaging studies   CBC: Recent Labs  Lab 03/02/19 0054 03/03/19 0237 03/04/19 0256  WBC 6.2 6.4 6.9  HGB 14.3 13.7 14.5  HCT 44.0 42.1 43.3  MCV 105.5* 105.3* 104.3*  PLT 155 141* 034    Basic Metabolic Panel: Recent Labs  Lab 03/01/19 1211 03/02/19 0054 03/03/19 0237  NA 140 139 137  K 4.2 4.3 3.9  CL 103 101 101  CO2 25 30 28   GLUCOSE 126* 126* 103*  BUN 15 11 12   CREATININE 1.01 0.96 0.82  CALCIUM 9.1 8.8* 8.6*    Liver Function Tests: No results for input(s): AST, ALT, ALKPHOS, BILITOT, PROT, ALBUMIN in the last 168 hours.  CBG: No results for input(s): GLUCAP in the last 168 hours.  Microbiology Studies:   Recent Results (from the past 240 hour(s))  Respiratory Panel by RT PCR (Flu A&B, Covid) - Nasopharyngeal Swab     Status: None   Collection Time: 03/01/19  4:43 PM   Specimen: Nasopharyngeal Swab  Result Value Ref Range Status   SARS Coronavirus 2 by RT PCR NEGATIVE NEGATIVE Final    Comment: (NOTE) SARS-CoV-2 target nucleic acids are NOT DETECTED. The SARS-CoV-2 RNA is generally detectable in upper respiratoy specimens during the acute phase of infection. The lowest concentration of SARS-CoV-2 viral copies  this assay can detect is 131 copies/mL. A negative result does not preclude SARS-Cov-2 infection and should not be used as the sole basis for treatment or other patient management decisions. A negative result may occur with  improper specimen collection/handling, submission of specimen other than nasopharyngeal swab, presence of viral mutation(s) within the areas targeted by this assay, and inadequate number of viral copies (<131 copies/mL). A negative result must be combined with clinical observations, patient history, and epidemiological information. The expected result is Negative. Fact Sheet for Patients:  PinkCheek.be Fact Sheet for Healthcare Providers:  GravelBags.it This  test is not yet ap proved or cleared by the [place] FDA and  has been authorized for detection and/or diagnosis of SARS-CoV-2 by FDA under an Emergency Use Authorization (EUA). This EUA will remain  in effect (meaning this test can be used) for the duration of the COVID-19 declaration under Section 564(b)(1) of the Act, 21 U.S.C. section 360bbb-3(b)(1), unless the authorization is terminated or revoked sooner.    Influenza A by PCR NEGATIVE NEGATIVE Final   Influenza B by PCR NEGATIVE NEGATIVE Final    Comment: (NOTE) The Xpert Xpress SARS-CoV-2/FLU/RSV assay is intended as an aid in  the diagnosis of influenza from Nasopharyngeal swab specimens and  should not be used as a sole basis for treatment. Nasal washings and  aspirates are unacceptable for Xpert Xpress SARS-CoV-2/FLU/RSV  testing. Fact Sheet for Patients: PinkCheek.be Fact Sheet for Healthcare Providers: GravelBags.it This test is not yet approved or cleared by the [place] FDA and  has been authorized for detection and/or diagnosis of SARS-CoV-2 by  FDA under an Emergency Use Authorization (EUA). This EUA will remain  in effect (meaning this test can be used) for the duration of the  Covid-19 declaration under Section 564(b)(1) of the Act, 21  U.S.C. section 360bbb-3(b)(1), unless the authorization is  terminated or revoked. Performed at [facility], [place] [address] [place], [place] [address]      Radiology Studies:  No results found.   Scheduled Meds:   . acetaminophen  1,000 mg Oral TID  . apixaban  5 mg Oral BID  . diltiazem  180 mg Oral Daily  . docusate sodium  100 mg Oral BID  . feeding supplement (ENSURE ENLIVE)  237 mL Oral TID BM  . losartan  100 mg Oral Daily   And  . hydrochlorothiazide  25 mg Oral Daily  . mesalamine  2.4 g Oral Daily  . multivitamin with minerals  1 tablet Oral Daily    Continuous Infusions:   .  methocarbamol (ROBAXIN) IV       LOS: 2 days     Vernell Leep, MD, [place], [facility]. [facility]    To contact the attending provider between 7A-7P or the covering provider during after hours 7P-7A, please log into the web site [email or web address] and access using universal [place] password for that web site. If you do not have the password, please call the hospital operator.  03/04/2019, 1:16 PM

## 2019-03-04 NOTE — Progress Notes (Signed)
Nutrition Follow-up  DOCUMENTATION CODES:   Obesity unspecified  INTERVENTION:   -Continue MVI with minerals daily -Continue Ensure Enlive po TID, each supplement provides 350 kcal and 20 grams of protein -D/c Magic cup TID with meals, each supplement provides 290 kcal and 9 grams of protein  NUTRITION DIAGNOSIS:   Increased nutrient needs related to hip fracture as evidenced by estimated needs.  Ongoing  GOAL:   Patient will meet greater than or equal to 90% of their needs  Progressing   MONITOR:   PO intake, Supplement acceptance, Labs, Weight trends, Skin, I & O's  REASON FOR ASSESSMENT:   Consult Assessment of nutrition requirement/status  ASSESSMENT:   Steven Blair is a 74 y.o. male with medical history significant of atrial fibrillation, nephrolithiasis, sleep apnea9 improved with weight loss) ulcerative colitis who presents to the emergency department after slipping and falling on black ice this morning.  He had immediate right hip pain and could not get up.  He came to the ED where CT scan showed an acute fracture of the lesser trochanter.  He also states that when he went to get his Covid vaccine last Friday stood in line a long time and developed left hip pain for which she was going to speak with the orthopedist today.  Unfortunately T scan showed a fracture of the lesser trochanter.  Orthopedic surgery saw the patient and felt that conservative management with physical therapy and pain control was in order and referred the patient to [place].  They plan to evaluate the patient in a couple weeks as an outpatient and consider appropriate timing for left hip replacement which needs to be done.  Reviewed I/O's: -505 ml x 24 hours and -291 ml since admission  UOP: 1 L x 24 hours  Spoke with pt at bedside, who was pleasant and in good spirits today. He reports feeling better, however, notes decreased appetite related to pain. Pt reports that he is trying to consume most  protein and vegetables and is consuming Ensure supplements. He does not prefer ice cream and sweets on his trays. Noted meal completion variable; PO: 15-100%.   PTA, pt reports having a great appetite. Since the pandemic hit, pt and his wife have been focusing on developing a healthier lifestyle, which included decreasing alcohol intake, consuming mainly salads/veggies and baked meats/fish, and walking for one hour daily. He reports mild intended wt loss due to lifestyle changes.  Discussed with pt importance of good meal and supplement intake to promote healing. He is amenable to continue Ensure supplements, but dislikes [employer], which RD will discontinue. Pt with no further concerns, but expressed appreciation for RD visit.   Per chart review, plan for SNF vs CIR.   Labs reviewed.   NUTRITION - FOCUSED PHYSICAL EXAM:    Most Recent Value  Orbital Region  No depletion  Upper Arm Region  No depletion  Thoracic and Lumbar Region  No depletion  Buccal Region  No depletion  Temple Region  No depletion  Clavicle Bone Region  No depletion  Clavicle and Acromion Bone Region  No depletion  Scapular Bone Region  No depletion  Dorsal Hand  No depletion  Patellar Region  No depletion  Anterior Thigh Region  No depletion  Posterior Calf Region  No depletion  Edema (RD Assessment)  None  Hair  Reviewed  Eyes  Reviewed  Mouth  Reviewed  Skin  Reviewed  Nails  Reviewed       Diet  Order:   Diet Order            Diet Heart Room service appropriate? Yes; Fluid consistency: Thin  Diet effective now              EDUCATION NEEDS:   No education needs have been identified at this time  Skin:  Skin Assessment: Reviewed RN Assessment  Last BM:  02/28/19  Height:   Ht Readings from Last 1 Encounters:  03/01/19 6' (1.829 m)    Weight:   Wt Readings from Last 1 Encounters:  03/01/19 118.7 kg    Ideal Body Weight:  80.9 kg  BMI:  Body mass index is 35.49 kg/m.  Estimated  Nutritional Needs:   Kcal:  2200-2400  Protein:  120-135 grams  Fluid:  > 2.2 L    [name], RD, LDN, [place] Registered Dietitian II Certified Diabetes Care and Education Specialist Please refer to [facility] for RD and/or RD on-call/weekend/after hours pager

## 2019-03-04 NOTE — Progress Notes (Signed)
Inpatient Rehabilitation Admissions Coordinator  I met with patient at bedside for rehab assessment. We discussed goals and expectations of an inpt rehab admit. He prefers CIR but amenable to SNF if insurance does not approve CIR. I have begun insurance approval with [facility] and will follow up tomorrow.  Danne Baxter, RN, MSN Rehab Admissions Coordinator [phone] 03/04/2019 4:55 PM

## 2019-03-04 NOTE — Plan of Care (Signed)
  Problem: Education: Goal: Verbalization of understanding the information provided (i.e., activity precautions, restrictions, etc) will improve Outcome: Progressing Goal: Individualized Educational Video(s) Outcome: Progressing

## 2019-03-04 NOTE — Progress Notes (Signed)
Progress Note  Patient Name: Steven Blair Date of Encounter: 03/04/2019  Primary Cardiologist: Malka So PA-C  Subjective   No complaints this morning, still A. fib with RVR on exertion  Inpatient Medications    Scheduled Meds: . acetaminophen  1,000 mg Oral TID  . apixaban  5 mg Oral BID  . diltiazem  180 mg Oral Daily  . docusate sodium  100 mg Oral BID  . feeding supplement (ENSURE ENLIVE)  237 mL Oral TID BM  . losartan  100 mg Oral Daily   And  . hydrochlorothiazide  25 mg Oral Daily  . mesalamine  2.4 g Oral Daily  . multivitamin with minerals  1 tablet Oral Daily   Continuous Infusions: . methocarbamol (ROBAXIN) IV     PRN Meds: methocarbamol **OR** methocarbamol (ROBAXIN) IV, morphine injection, oxyCODONE, zolpidem   Vital Signs    Vitals:   03/03/19 1603 03/03/19 1647 03/03/19 2143 03/04/19 0547  BP:  113/66 118/68 128/80  Pulse: 86 86 95 98  Resp:  18 16 16   Temp:   98.7 F (37.1 C) 98.8 F (37.1 C)  TempSrc:   Oral Oral  SpO2:  99% 97% 97%  Weight:      Height:        Intake/Output Summary (Last 24 hours) at 03/04/2019 0808 Last data filed at 03/04/2019 0548 Gross per 24 hour  Intake 520 ml  Output 1025 ml  Net -505 ml   Last 3 Weights 03/01/2019 03/01/2019 03/01/2019  Weight (lbs) 261 lb 11 oz 255 lb 265 lb 3.4 oz  Weight (kg) 118.7 kg 115.667 kg 120.3 kg      Telemetry    A. fib with RVR, brief run of nonsustained ventricular tachycardia- Personally Reviewed  ECG    Not performed today- Personally Reviewed  Physical Exam   GEN: No acute distress.   Neck: No JVD Cardiac:  Irregularly irregular, no murmurs, rubs, or gallops.  Respiratory: Clear to auscultation bilaterally. GI: Soft, nontender, non-distended  MS: No edema; No deformity. Neuro:  Nonfocal  Psych: Normal affect   Labs    High Sensitivity Troponin:  No results for input(s): TROPONINIHS in the last 720 hours.    Chemistry Recent Labs  Lab 03/01/19 1211  03/02/19 0054 03/03/19 0237  NA 140 139 137  K 4.2 4.3 3.9  CL 103 101 101  CO2 25 30 28   GLUCOSE 126* 126* 103*  BUN 15 11 12   CREATININE 1.01 0.96 0.82  CALCIUM 9.1 8.8* 8.6*  GFRNONAA >60 >60 >60  GFRAA >60 >60 >60  ANIONGAP 12 8 8      Hematology Recent Labs  Lab 03/02/19 0054 03/03/19 0237 03/04/19 0256  WBC 6.2 6.4 6.9  RBC 4.17* 4.00* 4.15*  HGB 14.3 13.7 14.5  HCT 44.0 42.1 43.3  MCV 105.5* 105.3* 104.3*  MCH 34.3* 34.3* 34.9*  MCHC 32.5 32.5 33.5  RDW 12.8 12.7 12.3  PLT 155 141* 150    BNPNo results for input(s): BNP, PROBNP in the last 168 hours.   DDimer No results for input(s): DDIMER in the last 168 hours.   Radiology    No results found.  Cardiac Studies   None  Patient Profile     Steven Blair is a 74 y.o. male with a hx of ulcerative colitis, HTN, HL, obesity, OSA, and PAF  who is being seen today for the evaluation of Afib RVR at the request of Dr. Lavella Lemons.  Assessment & Plan  1: Persistent atrial fibrillation-history of persistent A. fib on Eliquis oral anticoagulation who we are asked to see because of RVR during PT and exercise.  He is on Cardizem CD 120 mg a day.  I wrote for this to be increased to 180 mg a day which we will start today.  In reviewing his rhythm strips he still it went up to heart rate of 140 during exertion the short run of nonsustained ventricular tachycardia.  2: Essential hypertension-controlled on current medications       For questions or updates, please contact [facility] Please consult [email or web address] for contact info under        Signed, Quay Burow, MD  03/04/2019, 8:08 AM

## 2019-03-04 NOTE — Progress Notes (Addendum)
Physical Therapy Treatment Patient Details Name: Steven Blair MRN: [id number] DOB: 03-27-1945 Today's Date: 03/04/2019    History of Present Illness 74 yo admitted after fall on black ice with right trochanter fx managed conservatively. Pt also with left hip pain due to OA. Pt with hematuria in ED with nephrolithiasis. PMHx: bil THA, Afib, sleep apnea, ulcerative colitis    PT Comments    Pt with increased mobility tolerance this am after premedication. Pt reports having sat on the EOB earlier today and having less frequent spasms. Pt able to get to EOB with reliance on rail, HOB elevated and assist and progress to standing. PT will perform well with use of Stedy for increased transfers OOB with nursing staff but remains limited by pain and spasm unable to walk or pivot with +1 assist at this time. Pt educated for bil LE HEP. Will continue to follow.    HR 130 at rest on arrival with rise to 138 with transition to standing. Once in chair HR down to 114 and remained 114-116 during seated HEP.   Follow Up Recommendations  CIR;SNF;Supervision/Assistance - 24 hour, pending further progression      Equipment Recommendations  Hospital bed    Recommendations for Other Services       Precautions / Restrictions Precautions Precautions: Fall Precaution Comments: unpredictable RLE muscle spasms Restrictions RLE Weight Bearing: Weight bearing as tolerated    Mobility  Bed Mobility Overal bed mobility: Needs Assistance Bed Mobility: Supine to Sit     Supine to sit: Mod assist;HOB elevated     General bed mobility comments: pt requesting HOB fully elevated prior to attempting pivot toward EOB with assist of RLE and to pivot pelvis toward edge with increased time  Transfers Overall transfer level: Needs assistance   Transfers: Sit to/from Stand Sit to Stand: Mod assist         General transfer comment: mod assist to rise from elevated bed with right knee blocked, cues for  hand placement and increased time x 2 trials. Pt transitioned from bed to chair while in standing and furniture moved behind pt as unable to pivot on feet  Ambulation/Gait   Gait Distance (Feet): 2 Feet   Gait Pattern/deviations: Step-to pattern;Decreased weight shift to right;Shuffle   Gait velocity interpretation: <1.31 ft/sec, indicative of household ambulator General Gait Details: pt side stepping 2' toward end of bed with right knee blocked due to spasms and partial buckling. Pt unable to safely step away from surface or pivot. Heavy reliance on bil UE support   Stairs             Wheelchair Mobility    Modified Rankin (Stroke Patients Only)       Balance Overall balance assessment: Needs assistance Sitting-balance support: Feet supported;No upper extremity supported Sitting balance-Leahy Scale: Fair     Standing balance support: Bilateral upper extremity supported Standing balance-Leahy Scale: Poor Standing balance comment: reliant on RW and BUE support                            Cognition Arousal/Alertness: Awake/alert Behavior During Therapy: WFL for tasks assessed/performed Overall Cognitive Status: Within Functional Limits for tasks assessed                                        Exercises General Exercises - Lower Extremity Long  Arc Quad: AROM;AAROM;Seated;Right;Left;10 reps(AAROM on RLE) Hip Flexion/Marching: AROM;AAROM;Seated;Right;Left;10 reps(AAROM on RLE)    General Comments        Pertinent Vitals/Pain Pain Score: 3  Pain Location: right hip at rest up to 8/10 with spasms. left hip 4/10 Pain Descriptors / Indicators: Aching;Spasm;Grimacing;Guarding;Moaning Pain Intervention(s): Monitored during session;Premedicated before session;Repositioned;Limited activity within patient's tolerance    Home Living                      Prior Function            PT Goals (current goals can now be found in the care  plan section) Progress towards PT goals: Progressing toward goals    Frequency    Min 5X/week      PT Plan Current plan remains appropriate    Co-evaluation              AM-PAC PT "6 Clicks" Mobility   Outcome Measure  Help needed turning from your back to your side while in a flat bed without using bedrails?: A Lot Help needed moving from lying on your back to sitting on the side of a flat bed without using bedrails?: A Lot Help needed moving to and from a bed to a chair (including a wheelchair)?: Total Help needed standing up from a chair using your arms (e.g., wheelchair or bedside chair)?: A Lot Help needed to walk in hospital room?: Total Help needed climbing 3-5 steps with a railing? : Total 6 Click Score: 9    End of Session Equipment Utilized During Treatment: Gait belt Activity Tolerance: Patient limited by pain Patient left: with call bell/phone within reach;in chair;with chair alarm set Nurse Communication: Mobility status;Need for lift equipment(recommend STedy) PT Visit Diagnosis: Other abnormalities of gait and mobility (R26.89);History of falling (Z91.81);Muscle weakness (generalized) (M62.81);Pain Pain - Right/Left: Right Pain - part of body: Hip     Time: 1749-4496 PT Time Calculation (min) (ACUTE ONLY): 29 min  Charges:  $Therapeutic Exercise: 8-22 mins $Therapeutic Activity: 8-22 mins                     Steven Blair, PT Acute Rehabilitation Services Pager: [phone] Office: [phone]    Sandy Salaam Domini Vandehei 03/04/2019, 12:45 PM

## 2019-03-05 LAB — GLUCOSE, CAPILLARY: Glucose-Capillary: 146 mg/dL — ABNORMAL HIGH (ref 70–99)

## 2019-03-05 MED ORDER — DILTIAZEM HCL ER 60 MG PO CP12
60.0000 mg | ORAL_CAPSULE | Freq: Two times a day (BID) | ORAL | Status: AC
  Administered 2019-03-05 (×2): 60 mg via ORAL
  Filled 2019-03-05 (×2): qty 1

## 2019-03-05 MED ORDER — LOSARTAN POTASSIUM 50 MG PO TABS
50.0000 mg | ORAL_TABLET | Freq: Every day | ORAL | Status: DC
  Administered 2019-03-06: 10:00:00 50 mg via ORAL
  Filled 2019-03-05: qty 1

## 2019-03-05 MED ORDER — HYDROCHLOROTHIAZIDE 25 MG PO TABS
25.0000 mg | ORAL_TABLET | Freq: Every day | ORAL | Status: DC
  Administered 2019-03-06: 10:00:00 25 mg via ORAL
  Filled 2019-03-05: qty 1

## 2019-03-05 MED ORDER — DILTIAZEM HCL ER COATED BEADS 240 MG PO CP24
240.0000 mg | ORAL_CAPSULE | Freq: Every day | ORAL | Status: DC
  Administered 2019-03-06 – 2019-03-11 (×6): 240 mg via ORAL
  Filled 2019-03-05 (×6): qty 1

## 2019-03-05 NOTE — Progress Notes (Signed)
PROGRESS NOTE   Steven Blair  IRS:[id number]    DOB: 1945/01/31    DOA: 03/01/2019  PCP: Josetta Huddle, MD   I have briefly reviewed patients previous medical records in [facility].  Chief Complaint:   Chief Complaint  Patient presents with  . Hip Pain    Brief Narrative:  74 year old male, lives with spouse, independent of daily activities, PMH of atrial fibrillation on Eliquis, nephrolithiasis, sleep apnea which improved after weight loss, ulcerative colitis, presented to the ED after slipping and falling on black ice on day of admission, immediately developed right hip pain and was unable to get up.  In the ED CT showed right hip acute fracture of the lesser trochanter.  Orthopedic surgery consulted and recommended conservative/nonoperative management with therapies and pain control and outpatient follow-up in a couple of weeks.  Developed hematuria in ED which has resolved.  Hospital course complicated by periodic A. fib with RVR and NSVT, cardiology consulted 2/10.  Patient's insurance has declined CIR admission.  Cardiology adjusting meds for his A. fib.  TOC to explore SNF options.   Assessment & Plan:  Active Problems:   Sleep apnea   Hypertension   Left hip pain   Atrial fibrillation (HCC)   Closed avulsion fracture of lesser trochanter of femur, right, initial encounter ([place])   Hematuria   Nephrolithiasis   Cholelithiasis   Avulsion fracture of condyle of femur (HCC)   Closed evulsion fracture of lesser trochanter of right femur: Seen by orthopedics, recommend nonsurgical/conservative management with pain control and therapies evaluation.  Hospital course complicated by ongoing pain issues which are gradually improving, patient remains on scheduled Tylenol, oral opioid use has decreased, he has not used much IV opioids.  PT reports improved activity tolerance and function today but still required maximum assist to stand from lower surface and to take steps. Patient's  insurance denied CIR.  TOC team to explore SNF options.  He is unsafe to go home.   Hematuria/nephrolithiasis: Patient denies prior history of hematuria.  Follows with Dr. Diona Fanti, [facility].  Etiology of hematuria unclear,?  Related to nephrolithiasis versus trauma from fall.  CT did not show any obstructing lesions.  Hematuria has resolved.  Resumed temporarily held apixaban and monitor closely.  Outpatient follow-up with Dr. Ambrose Pancoast was advised to patient who verbalized understanding.  No recurrence of hematuria.  Left hip osteoarthritis with pain: Reportedly has had both hips replaced by Dr. Shellia Carwin years ago.  Now follows with Dr. Alvan Dame, outpatient follow-up and considering repeat surgery.  Chronic A. fib with intermittent RVR: TTE showed LVEF 45-50% in November 2020.  Failed DCCV in 10/2018.  Cardiology consulted, reportedly followed in their A. fib clinic.  Briefly held apixaban was resumed on 2/10.  After increasing Cardizem CD from 120 to 180 mg, heart rates well controlled in the 90s at rest but with activity RVR in the 110s-130s.  Cardiology is further increased Cardizem CD to 240 mg daily and cut back on losartan to avoid hypotension. Continue telemetry.  NSVT: TTE as noted above.  Continue Cardizem CD.  No further episodes noted in the last 24 hours.  History of sleep apnea: Reportedly had BMI of 45, now down to 35.  Continue CPAP.  Outpatient follow-up.  Essential hypertension: Reportedly improved after weight loss.  Cardizem CD increased as above, continue HCTZ, losartan decreased from 100 to 50 mg daily to avoid hypotension  Thrombocytopenia: Unclear etiology.  Resolved today.  Body mass index is 35.49  kg/m.  Obesity  Nutritional Status Nutrition Problem: Increased nutrient needs Etiology: hip fracture Signs/Symptoms: estimated needs Interventions: Ensure Enlive (each supplement provides 350kcal and 20 grams of protein), Magic cup, MVI  DVT prophylaxis:  Resumed Eliquis. Code Status: Full. Family Communication: None at bedside.  Offered again to speak with family but patient declined and stated that he has been updating them. Disposition:  . Patient came from: Home           . Anticipated d/c place: Insurance declined CIR.  Possible SNF. Marland Kitchen Barriers to d/c: Ongoing significant pain issues, poor effort tolerance due to pain and requires maximum assist with therapies, ongoing A. fib with RVR especially with minimal activity likely due to pain, CIR decline, unsafe to go home, will need SNF.    Consultants:   Orthopedics. Cardiology.  Procedures:   None  Antimicrobials:   None   Subjective:  Reports that his right hip pain is slowly improving.  Able to do more.  Has been sitting up in reclining chair a couple times a day for a couple hours each time.  Had a good BM this morning.  No palpitations.  Objective:   Vitals:   03/04/19 1445 03/04/19 2107 03/05/19 0521 03/05/19 1344  BP: 107/60 115/71 125/74 102/65  Pulse: 89 91 99 92  Resp: 18 18 20 18   Temp: 99.3 F (37.4 C) 99.8 F (37.7 C) 98.5 F (36.9 C) 98.6 F (37 C)  TempSrc: Oral Oral Oral Oral  SpO2: 98% 96% 97% 95%  Weight:      Height:        General exam: Elderly male, moderately built and obese sitting up comfortably in reclining chair.  Oral mucosa moist. Respiratory system: Clear to auscultation.  No increased work of breathing. Cardiovascular system: S1 and S2 heard, irregularly irregular.  No JVD, murmurs or pedal edema.  Telemetry personally reviewed: A. fib with well-controlled ventricular rate in the 90s except with activity when it jumps up to between 110s-130s like early this morning at around 6:30 AM. Gastrointestinal system: Abdomen is nondistended, soft and nontender. No organomegaly or masses felt. Normal bowel sounds heard. Central nervous system: Alert and oriented. No focal neurological deficits. Extremities: Symmetric 5 x 5 power except right lower  extremity movements restricted due to pain. Skin: No rashes, lesions or ulcers Psychiatry: Judgement and insight appear normal. Mood & affect appropriate.     Data Reviewed:   I have personally reviewed following labs and imaging studies   CBC: Recent Labs  Lab 03/02/19 0054 03/03/19 0237 03/04/19 0256  WBC 6.2 6.4 6.9  HGB 14.3 13.7 14.5  HCT 44.0 42.1 43.3  MCV 105.5* 105.3* 104.3*  PLT 155 141* 759    Basic Metabolic Panel: Recent Labs  Lab 03/01/19 1211 03/02/19 0054 03/03/19 0237  NA 140 139 137  K 4.2 4.3 3.9  CL 103 101 101  CO2 25 30 28   GLUCOSE 126* 126* 103*  BUN 15 11 12   CREATININE 1.01 0.96 0.82  CALCIUM 9.1 8.8* 8.6*    Liver Function Tests: No results for input(s): AST, ALT, ALKPHOS, BILITOT, PROT, ALBUMIN in the last 168 hours.  CBG: No results for input(s): GLUCAP in the last 168 hours.  Microbiology Studies:   Recent Results (from the past 240 hour(s))  Respiratory Panel by RT PCR (Flu A&B, Covid) - Nasopharyngeal Swab     Status: None   Collection Time: 03/01/19  4:43 PM   Specimen: Nasopharyngeal Swab  Result Value  Ref Range Status   SARS Coronavirus 2 by RT PCR NEGATIVE NEGATIVE Final    Comment: (NOTE) SARS-CoV-2 target nucleic acids are NOT DETECTED. The SARS-CoV-2 RNA is generally detectable in upper respiratoy specimens during the acute phase of infection. The lowest concentration of SARS-CoV-2 viral copies this assay can detect is 131 copies/mL. A negative result does not preclude SARS-Cov-2 infection and should not be used as the sole basis for treatment or other patient management decisions. A negative result may occur with  improper specimen collection/handling, submission of specimen other than nasopharyngeal swab, presence of viral mutation(s) within the areas targeted by this assay, and inadequate number of viral copies (<131 copies/mL). A negative result must be combined with clinical observations, patient history, and  epidemiological information. The expected result is Negative. Fact Sheet for Patients:  PinkCheek.be Fact Sheet for Healthcare Providers:  GravelBags.it This test is not yet ap proved or cleared by the [place] FDA and  has been authorized for detection and/or diagnosis of SARS-CoV-2 by FDA under an Emergency Use Authorization (EUA). This EUA will remain  in effect (meaning this test can be used) for the duration of the COVID-19 declaration under Section 564(b)(1) of the Act, 21 U.S.C. section 360bbb-3(b)(1), unless the authorization is terminated or revoked sooner.    Influenza A by PCR NEGATIVE NEGATIVE Final   Influenza B by PCR NEGATIVE NEGATIVE Final    Comment: (NOTE) The Xpert Xpress SARS-CoV-2/FLU/RSV assay is intended as an aid in  the diagnosis of influenza from Nasopharyngeal swab specimens and  should not be used as a sole basis for treatment. Nasal washings and  aspirates are unacceptable for Xpert Xpress SARS-CoV-2/FLU/RSV  testing. Fact Sheet for Patients: PinkCheek.be Fact Sheet for Healthcare Providers: GravelBags.it This test is not yet approved or cleared by the [place] FDA and  has been authorized for detection and/or diagnosis of SARS-CoV-2 by  FDA under an Emergency Use Authorization (EUA). This EUA will remain  in effect (meaning this test can be used) for the duration of the  Covid-19 declaration under Section 564(b)(1) of the Act, 21  U.S.C. section 360bbb-3(b)(1), unless the authorization is  terminated or revoked. Performed at [facility], [place] [address], [place], [place] [address]      Radiology Studies:  No results found.   Scheduled Meds:   . acetaminophen  1,000 mg Oral TID  . apixaban  5 mg Oral BID  . [START ON 03/06/2019] diltiazem  240 mg Oral Daily  . diltiazem  60 mg Oral Q12H  . docusate sodium  100 mg  Oral BID  . feeding supplement (ENSURE ENLIVE)  237 mL Oral TID BM  . [START ON 03/06/2019] losartan  50 mg Oral Daily   And  . [START ON 03/06/2019] hydrochlorothiazide  25 mg Oral Daily  . mesalamine  2.4 g Oral Daily  . multivitamin with minerals  1 tablet Oral Daily    Continuous Infusions:   . methocarbamol (ROBAXIN) IV       LOS: 3 days     Vernell Leep, MD, [place], [facility]. [facility]    To contact the attending provider between 7A-7P or the covering provider during after hours 7P-7A, please log into the web site [email or web address] and access using universal [place] password for that web site. If you do not have the password, please call the hospital operator.  03/05/2019, 2:42 PM

## 2019-03-05 NOTE — Progress Notes (Signed)
Luanne (wife) came to visit pt and she stated they would like for pt to go to [facility] post discharge.

## 2019-03-05 NOTE — TOC Initial Note (Addendum)
Transition of Care [facility]) - Initial/Assessment Note    Patient Details  Name: Steven Blair MRN: [id number] Date of Birth: 1945/12/25  Transition [facility]) CM/SW Contact:    Alexander Mt, LCSW Phone Number: 03/05/2019, 11:01 AM  Clinical Narrative:                 CSW spoke with pt at bedside. Introduced self, role, reason for visit. Pt from home with his spouse Steven Blair. Confirmed PCP, home address and insurance coverage. 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 He slipped on some black ice at home. Currently has been progressing and has been able to take a few steps today with therapy. We discussed recs for CIR and that pt has been started for insurance approval for our inpatient rehab. We discussed that at this time this writer will follow as we cannot have two insurance claims going at one time. Pt hopeful for CIR but agreeable to SNF if insurance does not approval an inpatient rehab admission.  Pt understands his limitations physically and that he would like to be stronger before going home. CSW explained that we can send secure referrals out on the hub for placement while we await insurance determination for CIR. Pt gives his permission. Pt also gives permission for this writer to speak with [name] to emphasize that we are making these referrals but would of course need to follow up with pt family before making any further decisions.   CSW called and spoke with Steven Blair via telephone. Introduced self, role, reason for call. Pt wife confirms desire for CIR placement but also in agreement for SNF placement if CIR is not approved. Explained referral process, pt wife aware- they have been through this in the past when pt had a brief stay at [facility]. We discussed insurance [facility] as primary; Medicare as secondary); previously they had to private pay for SNF, we will of course review insurance benefits first.   At this time pt and pt wife understand that [facility] team  remains available should plan for inpatient rehab not be approved. Pt wife has this writers telephone number and knows that TOC coverage will be someone different this weekend. CSW awaits insurance determination for CIR.   Of note pt scheduled to receive second dose of COVID vaccine on 2/25 per Steven Blair.  Expected Discharge Plan: IP Rehab Facility(vs SNF) Barriers to Discharge: Continued Medical Work up, Ship broker   Patient Goals and CMS Choice Patient states their goals for this hospitalization and ongoing recovery are:: inpatient rehab versus short term rehab   Choice offered to / list presented to : Patient, Spouse  Expected Discharge Plan and Services Expected Discharge Plan: IP Rehab Facility(vs SNF) In-house Referral: Clinical Social Work Discharge Planning Services: CM Consult Post Acute Care Choice: IP Rehab, [place] Living arrangements for the past 2 months: Single Family Home  Prior Living Arrangements/Services Living arrangements for the past 2 months: Single Family Home Lives with:: Spouse Patient language and need for interpreter reviewed:: Yes Do you feel safe going back to the place where you live?: Yes      Need for Family Participation in Patient Care: Yes (Comment)(assistance with daily cares) Care giver support system in place?: Yes (comment)(pt spouse)   Criminal Activity/Legal Involvement Pertinent to Current Situation/Hospitalization: No - Comment as needed  Activities of Daily Living Home Assistive Devices/Equipment: None ADL Screening (condition at time of admission) Patient's cognitive ability adequate to safely complete  daily activities?: Yes Is the patient deaf or have difficulty hearing?: No Does the patient have difficulty seeing, even when wearing glasses/contacts?: No Does the patient have difficulty concentrating, remembering, or making decisions?: No Patient able to express need for assistance with ADLs?: Yes Does the  patient have difficulty dressing or bathing?: No Independently performs ADLs?: Yes (appropriate for developmental age) Does the patient have difficulty walking or climbing stairs?: Yes Weakness of Legs: Right Weakness of Arms/Hands: None  Permission Sought/Granted Permission sought to share information with : Family Supports Permission granted to share information with : Yes, Verbal Permission Granted  Share Information with NAME: Steven Blair  Permission granted to share info w AGENCY: SNFs/CIR  Permission granted to share info w Relationship: wife  Permission granted to share info w Contact Information: [phone]  Emotional Assessment Appearance:: Appears stated age Attitude/Demeanor/Rapport: Engaged, Gracious Affect (typically observed): Accepting, Adaptable, Appropriate, Pleasant Orientation: : Oriented to Self, Oriented to Place, Oriented to  Time, Oriented to Situation Alcohol / Substance Use: Not Applicable Psych Involvement: No (comment)  Admission diagnosis:  Right hip pain [M25.551] Fall, initial encounter [W19.XXXA] Fall on same level from slipping, tripping or stumbling, initial encounter [W01.0XXA] Closed avulsion fracture of lesser trochanter of left femur ([place]) [S72.122A] Closed avulsion fracture of lesser trochanter of right femur, initial encounter ([place]) [S72.121A] Hematuria, unspecified type [R31.9] Avulsion fracture of condyle of femur ([place]) [S72.499A] Patient Active Problem List   Diagnosis Date Noted  . Avulsion fracture of condyle of femur ([place]) 03/02/2019  . Closed avulsion fracture of lesser trochanter of femur, right, initial encounter ([place]) 03/01/2019  . Hematuria 03/01/2019  . Nephrolithiasis 03/01/2019  . Cholelithiasis 03/01/2019  . Acquired thrombophilia ([place]) 12/23/2018  . Atrial fibrillation ([place]) 10/14/2018  . Inability to walk 10/19/2017  . Hip pain 10/19/2017  . Left hip pain 10/19/2017  . Protein-calorie malnutrition, severe 11/01/2014   . Lower GI bleed 10/31/2014  . Colitis 10/31/2014  . History of DVT of lower extremity 10/31/2014  . Sleep apnea   . Hypertension    PCP:  Josetta Huddle, MD Pharmacy:   [facility] [place], [place] - [address] [place] [address] Phone: [phone] Fax: [phone]   Readmission Risk Interventions Readmission Risk Prevention Plan 03/05/2019  Post Dischage Appt Not Complete  Appt Comments disposition pending  Medication Screening Complete  Transportation Screening Complete  Some recent data might be hidden

## 2019-03-05 NOTE — Progress Notes (Signed)
Inpatient Rehabilitation Admissions Coordinator  Insurance has denied CIR admit. I have contacted pt and he is aware. He requests SNF. I have alerted RN CM and SW. We will sign off at this time.  Danne Baxter, RN, MSN Rehab Admissions Coordinator [phone] 03/05/2019 2:23 PM

## 2019-03-05 NOTE — Progress Notes (Addendum)
Physical Therapy Treatment Patient Details Name: Steven Blair MRN: [id number] DOB: March 06, 1945 Today's Date: 03/05/2019    History of Present Illness 74 yo admitted after fall on black ice with right trochanter fx managed conservatively. Pt also with left hip pain due to OA. Pt with hematuria in ED with nephrolithiasis. PMHx: bil THA, Afib, sleep apnea, ulcerative colitis    PT Comments    Pt in chair on arrival and willing to progress mobility this session. Pt continues to struggle with core activation to perform anterior translation even in recliner. Pt with max assist to stand from lower surface and able to progress to taking steps away from surface today with right knee blocked throughout due to continued spasms and partial buckling throughout stance. Pt with improved activity tolerance and function today with D/C plan still appropriate.  HR 130 at rest on arrival with rate 120-134 during activity and 105 at rest end of session    Follow Up Recommendations  CIR;Supervision/Assistance - 24 hour     Equipment Recommendations  Hospital bed    Recommendations for Other Services       Precautions / Restrictions Precautions Precautions: Fall Precaution Comments: unpredictable RLE muscle spasms Restrictions Weight Bearing Restrictions: Yes RLE Weight Bearing: Weight bearing as tolerated    Mobility  Bed Mobility Overal bed mobility: Needs Assistance Bed Mobility: Sit to Supine     Supine to sit: Mod assist;HOB elevated Sit to supine: Mod assist   General bed mobility comments: physical assist to bring RLE onto surface with cues, increased time and assist to position in midline in bed  Transfers Overall transfer level: Needs assistance   Transfers: Sit to/from Stand Sit to Stand: Max assist         General transfer comment: pt in chair on arrival and required max assist with right knee blocked, sequential cues and physical assist to rise x 2 trials prior to  successfully rising fully and stabilizing with RW  Ambulation/Gait Ambulation/Gait assistance: Min assist Gait Distance (Feet): 5 Feet Assistive device: Rolling walker (2 wheeled) Gait Pattern/deviations: Step-to pattern;Decreased weight shift to right;Shuffle   Gait velocity interpretation: <1.31 ft/sec, indicative of household ambulator General Gait Details: pt able to step 3' away from chair toward bed then pivot and side step with right knee blocked throughout, increased time, cues for sequence and reliance on bil UE on RW to maintain standing   Stairs             Wheelchair Mobility    Modified Rankin (Stroke Patients Only)       Balance Overall balance assessment: Needs assistance Sitting-balance support: Feet supported;No upper extremity supported Sitting balance-Leahy Scale: Fair Sitting balance - Comments: minguard EOB   Standing balance support: Bilateral upper extremity supported Standing balance-Leahy Scale: Poor Standing balance comment: reliant on RW and BUE support, assist to block right knee                            Cognition Arousal/Alertness: Awake/alert Behavior During Therapy: WFL for tasks assessed/performed Overall Cognitive Status: Within Functional Limits for tasks assessed                                        Exercises General Exercises - Lower Extremity Long Arc Quad: AROM;AAROM;Seated;Right;Left;10 reps Hip Flexion/Marching: AROM;AAROM;Seated;Right;Left;10 reps    General Comments  Pertinent Vitals/Pain Pain Assessment: Faces Pain Score: 5  Faces Pain Scale: Hurts little more Pain Location: R hip d/t spasms Pain Descriptors / Indicators: Aching;Spasm;Grimacing;Guarding Pain Intervention(s): Limited activity within patient's tolerance;Monitored during session;Premedicated before session;Repositioned;Ice applied    Home Living                      Prior Function            PT  Goals (current goals can now be found in the care plan section) Acute Rehab PT Goals Patient Stated Goal: to be able to walk and go home Progress towards PT goals: Progressing toward goals    Frequency    Min 5X/week      PT Plan Current plan remains appropriate    Co-evaluation              AM-PAC PT "6 Clicks" Mobility   Outcome Measure  Help needed turning from your back to your side while in a flat bed without using bedrails?: A Lot Help needed moving from lying on your back to sitting on the side of a flat bed without using bedrails?: A Lot Help needed moving to and from a bed to a chair (including a wheelchair)?: Total Help needed standing up from a chair using your arms (e.g., wheelchair or bedside chair)?: Total Help needed to walk in hospital room?: A Lot Help needed climbing 3-5 steps with a railing? : Total 6 Click Score: 9    End of Session Equipment Utilized During Treatment: Gait belt Activity Tolerance: Patient tolerated treatment well Patient left: in bed;with call bell/phone within reach;with bed alarm set Nurse Communication: Mobility status;Need for lift equipment(St[place]) PT Visit Diagnosis: Other abnormalities of gait and mobility (R26.89);History of falling (Z91.81);Muscle weakness (generalized) (M62.81);Pain     Time: 0902-0932 PT Time Calculation (min) (ACUTE ONLY): 30 min  Charges:  $Gait Training: 8-22 mins $Therapeutic Exercise: 8-22 mins                     Kennette Cuthrell P, PT Acute Rehabilitation Services Pager: [phone] Office: [place] 03/05/2019, 1:42 PM

## 2019-03-05 NOTE — TOC Progression Note (Signed)
Transition of Care [facility]) - Progression Note    Patient Details  Name: Steven Blair MRN: [id number] Date of Birth: 1945/07/06  Transition of Care [facility]) CM/SW [place], [place] Phone Number: 03/05/2019, 3:35 PM  Clinical Narrative:    CSW made aware of insurance denial for CIR. At this time pt still agreeable for SNF. CSW visited pt in room, reintroduced self/role. Provided pt with CMS ratings for facilities that have offered. Will need choice and insurance approval in order to place pt.   Pt would like a conversation between [place] team member and his wife and him. CSW let pt know that I would alert bedside RN and Nursing Secretary to let me know if LuAnn arrives prior to end of shift at 4:30pm. Note placed at front desk to call CSW if she arrives, if not will leave hand off for CSW team to check in with pt.    Expected Discharge Plan: IP Rehab Facility(vs SNF) Barriers to Discharge: Continued Medical Work up, Ship broker  Expected Discharge Plan and Services Expected Discharge Plan: IP Rehab Facility(vs SNF) In-house Referral: Clinical Social Work Discharge Planning Services: CM Consult Post Acute Care Choice: [place], [place] arrangements for the past 2 months: [place]  Readmission Risk Interventions Readmission Risk Prevention Plan 03/05/2019  Post Dischage Appt Not Complete  Appt Comments disposition pending  Medication Screening Complete  Transportation Screening Complete  Some recent data might be hidden

## 2019-03-05 NOTE — Progress Notes (Signed)
Occupational Therapy Treatment Patient Details Name: Steven Blair MRN: [id number] DOB: April 12, 1945 Today's Date: 03/05/2019    History of present illness 74 yo admitted after fall on black ice with right trochanter fx managed conservatively. Pt also with left hip pain due to OA. Pt with hematuria in ED with nephrolithiasis. PMHx: bil THA, Afib, sleep apnea, ulcerative colitis   OT comments  Patient agreeable to OT session, in bed upon arrival. Patient require mod A with HOB significantly elevated with support of R LE and to elevate trunk to sitting. OT educate patient on AE for LB dressing, patient return demo to practice doffing socks, don/doff hospital pants. Patient did require assist to don socks in preparation for transfer to recliner. With bed elevated patient require mod A to stand with bariatric stedy lift and mod A for eccentric control when sitting into recliner chair. Patient progressing towards goals, will continue to follow.    Follow Up Recommendations  CIR;Supervision/Assistance - 24 hour    Equipment Recommendations  Other (comment)       Precautions / Restrictions Precautions Precautions: Fall Precaution Comments: unpredictable RLE muscle spasms Restrictions Weight Bearing Restrictions: Yes RLE Weight Bearing: Weight bearing as tolerated       Mobility Bed Mobility Overal bed mobility: Needs Assistance Bed Mobility: Supine to Sit     Supine to sit: Mod assist;HOB elevated     General bed mobility comments: pt requesting HOB fully elevated prior to attempting pivot toward EOB with assist of RLE and to pivot pelvis toward edge with increased time  Transfers Overall transfer level: Needs assistance   Transfers: Sit to/from Stand Sit to Stand: Mod assist;From elevated surface         General transfer comment: mod A to rise from elevated bed and increased time to weight shift in order to reach both hands onto stedy to assist with pulling himself foward far  enough to place paddles. mod A for eccentric control when sitting into recliner    Balance Overall balance assessment: Needs assistance Sitting-balance support: Feet supported;No upper extremity supported Sitting balance-Leahy Scale: Fair                                     ADL either performed or assessed with clinical judgement   ADL Overall ADL's : Needs assistance/impaired                     Lower Body Dressing: Moderate assistance;Sitting/lateral leans Lower Body Dressing Details (indicate cue type and reason): demo AE use for LB dressing, patient return demo doff socks with reacher, don/doff hospital pants with reacher set up assist and total A to don socks seated EOB. cues for technique               General ADL Comments: patient progressing towards independence with self care this session including education for LB dressing techniques and use of AE               Cognition Arousal/Alertness: Awake/alert Behavior During Therapy: WFL for tasks assessed/performed Overall Cognitive Status: Within Functional Limits for tasks assessed  Pertinent Vitals/ Pain       Pain Assessment: Faces Faces Pain Scale: Hurts little more Pain Location: R hip d/t spasms Pain Descriptors / Indicators: Aching;Spasm;Grimacing;Guarding Pain Intervention(s): Premedicated before session;Monitored during session;Repositioned         Frequency  Min 2X/week        Progress Toward Goals  OT Goals(current goals can now be found in the care plan section)  Progress towards OT goals: Progressing toward goals  Acute Rehab OT Goals Patient Stated Goal: to be able to walk and go home OT Goal Formulation: With patient Time For Goal Achievement: 03/16/19 Potential to Achieve Goals: Good ADL Goals Pt Will Perform Grooming: standing;with supervision Pt Will Perform Lower Body Bathing: with min  assist;sit to/from stand Pt Will Perform Lower Body Dressing: with min assist;sit to/from stand Pt Will Transfer to Toilet: with min assist Pt Will Perform Toileting - Clothing Manipulation and hygiene: with min assist  Plan Discharge plan remains appropriate       AM-PAC OT "6 Clicks" Daily Activity     Outcome Measure   Help from another person eating meals?: None Help from another person taking care of personal grooming?: A Little Help from another person toileting, which includes using toliet, bedpan, or urinal?: Total Help from another person bathing (including washing, rinsing, drying)?: A Lot Help from another person to put on and taking off regular upper body clothing?: A Little Help from another person to put on and taking off regular lower body clothing?: A Lot 6 Click Score: 15    End of Session Equipment Utilized During Treatment: Other (comment)(LH AE, bariatric stedy)  OT Visit Diagnosis: Unsteadiness on feet (R26.81);Muscle weakness (generalized) (M62.81);History of falling (Z91.81);Pain Pain - Right/Left: Right Pain - part of body: Leg;Hip   Activity Tolerance Patient tolerated treatment well   Patient Left in chair;with call bell/phone within reach;with chair alarm set   Nurse Communication Mobility status        Time: 3729-0211 OT Time Calculation (min): 43 min  Charges: OT General Charges $OT Visit: 1 Visit OT Treatments $Self Care/Home Management : 38-52 mins  [place] OT office: [place] 03/05/2019, 1:37 PM

## 2019-03-05 NOTE — NC FL2 (Signed)
[place] LEVEL OF CARE SCREENING TOOL     IDENTIFICATION  Patient Name: Steven Blair Birthdate: 02/23/1945 Sex: male Admission Date (Current Location): 03/01/2019  [facility] and [place] Number:  Herbalist and Address:  [facility], [place] [address], [place], [place] [address]      Provider Number: [id number]  Attending Physician Name and Address:  Modena Jansky, MD  Relative Name and Phone Number:       Current Level of Care: Hospital Recommended Level of Care: [place] Prior Approval Number:    Date Approved/Denied:   PASRR Number: [id number]  Discharge Plan: SNF    Current Diagnoses: Patient Active Problem List   Diagnosis Date Noted  . Avulsion fracture of condyle of femur ([place]) 03/02/2019  . Closed avulsion fracture of lesser trochanter of femur, right, initial encounter ([place]) 03/01/2019  . Hematuria 03/01/2019  . Nephrolithiasis 03/01/2019  . Cholelithiasis 03/01/2019  . Acquired thrombophilia ([place]) 12/23/2018  . Atrial fibrillation ([place]) 10/14/2018  . Inability to walk 10/19/2017  . Hip pain 10/19/2017  . Left hip pain 10/19/2017  . Protein-calorie malnutrition, severe 11/01/2014  . Lower GI bleed 10/31/2014  . Colitis 10/31/2014  . History of DVT of lower extremity 10/31/2014  . Sleep apnea   . Hypertension     Orientation RESPIRATION BLADDER Height & Weight     Self, Time, Situation, Place  Normal Continent Weight: 261 lb 11 oz (118.7 kg) Height:  6' (182.9 cm)  BEHAVIORAL SYMPTOMS/MOOD NEUROLOGICAL BOWEL NUTRITION STATUS      Continent    AMBULATORY STATUS COMMUNICATION OF NEEDS Skin   Extensive Assist Verbally Normal                       Personal Care Assistance Level of Assistance  Bathing, Feeding, Dressing Bathing Assistance: Maximum assistance Feeding assistance: Independent Dressing Assistance: Maximum assistance     Functional Limitations Info  Sight,  Hearing, Speech Sight Info: Adequate Hearing Info: Adequate Speech Info: Adequate    SPECIAL CARE FACTORS FREQUENCY  PT (By licensed PT), OT (By licensed OT)     PT Frequency: 5x week OT Frequency: 5x week            Contractures Contractures Info: Not present    Additional Factors Info  Code Status, Allergies Code Status Info: Full Code Allergies Info: Sulfa Antibiotics           Current Medications (03/05/2019):  This is the current hospital active medication list Current Facility-Administered Medications  Medication Dose Route Frequency Provider Last Rate Last Admin  . acetaminophen (TYLENOL) tablet 1,000 mg  1,000 mg Oral TID Modena Jansky, MD   1,000 mg at 03/05/19 0936  . apixaban (ELIQUIS) tablet 5 mg  5 mg Oral BID Modena Jansky, MD   5 mg at 03/05/19 0939  . [START ON 03/06/2019] diltiazem (CARDIZEM CD) 24 hr capsule 240 mg  240 mg Oral Daily Bhagat, Bhavinkumar, PA      . diltiazem (CARDIZEM SR) 12 hr capsule 60 mg  60 mg Oral Q12H Bhagat, Bhavinkumar, PA   60 mg at 03/05/19 1132  . docusate sodium (COLACE) capsule 100 mg  100 mg Oral BID Lady Deutscher, MD   100 mg at 03/05/19 2119  . feeding supplement (ENSURE ENLIVE) (ENSURE ENLIVE) liquid 237 mL  237 mL Oral TID BM Darliss Cheney, MD   237 mL at 03/05/19 0939  . [  START ON 03/06/2019] losartan (COZAAR) tablet 50 mg  50 mg Oral Daily Bhagat, Bhavinkumar, PA       And  . [START ON 03/06/2019] hydrochlorothiazide (HYDRODIURIL) tablet 25 mg  25 mg Oral Daily Bhagat, Bhavinkumar, PA      . mesalamine (LIALDA) EC tablet 2.4 g  2.4 g Oral Daily Lady Deutscher, MD   2.4 g at 03/05/19 0937  . methocarbamol (ROBAXIN) tablet 500 mg  500 mg Oral Q6H PRN Lady Deutscher, MD   500 mg at 03/05/19 1405   Or  . methocarbamol (ROBAXIN) 500 mg in dextrose 5 % 50 mL IVPB  500 mg Intravenous Q6H PRN Lady Deutscher, MD      . morphine 2 MG/ML injection 2 mg  2 mg Intravenous Q2H PRN Lady Deutscher, MD   2 mg at  03/02/19 1015  . multivitamin with minerals tablet 1 tablet  1 tablet Oral Daily Darliss Cheney, MD   1 tablet at 03/05/19 0937  . oxyCODONE (Oxy IR/ROXICODONE) immediate release tablet 10 mg  10 mg Oral Q4H PRN Lady Deutscher, MD   10 mg at 03/05/19 1401  . zolpidem (AMBIEN) tablet 5 mg  5 mg Oral QHS PRN Lady Deutscher, MD         Discharge Medications: Please see discharge summary for a list of discharge medications.  Relevant Imaging Results:  Relevant Lab Results:   Additional Information SS#[id number] [place], [place]

## 2019-03-05 NOTE — Social Work (Signed)
CSW acknowledging consult for SNF placement. At this time will need to wait on insurance determination for CIR prior to initiating SNF placement. Pt primary insurance is [employer] would need authorization prior to placement.    Westley Hummer, MSW, [place] Work

## 2019-03-05 NOTE — Progress Notes (Addendum)
Progress Note  Patient Name: Steven Blair Date of Encounter: 03/05/2019  Primary Cardiologist: Malka So, [facility]  Subjective   Rate remain elevated with exertion. No chest pain or shortness of breath.   Inpatient Medications    Scheduled Meds:  acetaminophen  1,000 mg Oral TID   apixaban  5 mg Oral BID   diltiazem  180 mg Oral Daily   docusate sodium  100 mg Oral BID   feeding supplement (ENSURE ENLIVE)  237 mL Oral TID BM   losartan  100 mg Oral Daily   And   hydrochlorothiazide  25 mg Oral Daily   mesalamine  2.4 g Oral Daily   multivitamin with minerals  1 tablet Oral Daily   Continuous Infusions:  methocarbamol (ROBAXIN) IV     PRN Meds: methocarbamol **OR** methocarbamol (ROBAXIN) IV, morphine injection, oxyCODONE, zolpidem   Vital Signs    Vitals:   03/04/19 1400 03/04/19 1445 03/04/19 2107 03/05/19 0521  BP:  107/60 115/71 125/74  Pulse: 95 89 91 99  Resp: 17 18 18 20   Temp: 99 F (37.2 C) 99.3 F (37.4 C) 99.8 F (37.7 C) 98.5 F (36.9 C)  TempSrc: Oral Oral Oral Oral  SpO2: 97% 98% 96% 97%  Weight:      Height:        Intake/Output Summary (Last 24 hours) at 03/05/2019 0953 Last data filed at 03/04/2019 2115 Gross per 24 hour  Intake 320 ml  Output 450 ml  Net -130 ml   Last 3 Weights 03/01/2019 03/01/2019 03/01/2019  Weight (lbs) 261 lb 11 oz 255 lb 265 lb 3.4 oz  Weight (kg) 118.7 kg 115.667 kg 120.3 kg      Telemetry    Atrial fibrillation at baseline rate of 90, with ambulation it goes to 140s - Personally Reviewed  ECG    No new tracing   Physical Exam   GEN: No acute distress.   Neck: No JVD Cardiac: Ir Ir trachycardic, no murmurs, rubs, or gallops.  Respiratory: Clear to auscultation bilaterally. GI: Soft, nontender, non-distended  MS: No edema; No deformity. Neuro:  Nonfocal  Psych: Normal affect   Labs    Chemistry Recent Labs  Lab 03/01/19 1211 03/02/19 0054 03/03/19 0237  NA 140 139 137  K 4.2 4.3 3.9  CL 103  101 101  CO2 25 30 28   GLUCOSE 126* 126* 103*  BUN 15 11 12   CREATININE 1.01 0.96 0.82  CALCIUM 9.1 8.8* 8.6*  GFRNONAA >60 >60 >60  GFRAA >60 >60 >60  ANIONGAP 12 8 8      Hematology Recent Labs  Lab 03/02/19 0054 03/03/19 0237 03/04/19 0256  WBC 6.2 6.4 6.9  RBC 4.17* 4.00* 4.15*  HGB 14.3 13.7 14.5  HCT 44.0 42.1 43.3  MCV 105.5* 105.3* 104.3*  MCH 34.3* 34.3* 34.9*  MCHC 32.5 32.5 33.5  RDW 12.8 12.7 12.3  PLT 155 141* 150    Radiology    No results found.  Cardiac Studies   Echo 12/03/18  1. Left ventricular ejection fraction, by visual estimation, is 45 to  50%. The left ventricle has mildly decreased function. There is mildly  increased left ventricular hypertrophy.   2. Left ventricular diastolic parameters are indeterminate.   3. Global right ventricle has normal systolic function.The right  ventricular size is mildly enlarged. No increase in right ventricular wall  thickness.   4. Left atrial size was severely dilated.   5. Right atrial size was mildly dilated.  6. The mitral valve is normal in structure. No evidence of mitral valve  regurgitation.   7. The tricuspid valve is normal in structure. Tricuspid valve  regurgitation is not demonstrated.   8. The aortic valve is tricuspid. Aortic valve regurgitation is not  visualized. No evidence of aortic valve sclerosis or stenosis.   9. The pulmonic valve was not well visualized. Pulmonic valve  regurgitation is not visualized.  10. There is mild dilatation of the aortic root measuring 40 mm.  11. The inferior vena cava is normal in size with greater than 50%  respiratory variability, suggesting right atrial pressure of 3 mmHg.   Patient Profile     Steven Blair is a 74 y.o. male with a hx of ulcerative colitis, HTN, HL, obesity, OSA, and PAF  who is being seen  for the evaluation of Afib RVR at the request of Dr. Lavella Lemons.  Assessment & Plan    1. Persistent atrial fibrillation - Failed  cardioversion recently.  - Cardizem increased to 127m>> baseline HR in 90s however with activities it goes to 140s.  - Recent echo 11/2018 with 45-50% LVEF, he is at risk for tachy induced cardiomyopathy - At times his BP soft, however room to reduce losartan (continue HCTZ at current dose) and up titrate Cardizem CD  - On Eliquis for anticoagulation   2. HTN - BP currently stable - as summarized above   3. OSA on CPAP  4. Orthopedic issue - Per primary team    Addendum: Reviewed with Dr. BGwenlyn Found He already got his Losartan and Cardizem today. Give additional Cardizem SR 650mBID today > from tomorrow start Cardizem CD 240 and reduce Losartan to 5039md. Keep HCTZ to 38m45m. Increase Cardizem as needed.  [facility] will sign off.   Medication Recommendations:  As summarized above Other recommendations (labs, testing, etc):  n/A Follow up as an outpatient:  Will arrange  For questions or updates, please contact [facility]chase consult [email or web address] for contact info under        Signed, BhavLeanor Kail  03/05/2019, 9:53 AM    Agree with note by Vin Robbie LisC  Patient continues to have A. fib with RVR with exertion.  He still getting physical therapy/rehab.  We are going to increase his Cardizem CD from 180 to 240 mg and simultaneously decrease his losartan from 100 to 50 mg to avoid hypotension.  I suspect he will be ready for discharge tomorrow.  Steven Blair., FACPAdamsCCWindom Area HospitalHALaverta BaltimoreAGlenwood Landing[address] DriveitNew Harmony  [phone]/2021 10:47 AM

## 2019-03-06 DIAGNOSIS — N179 Acute kidney failure, unspecified: Secondary | ICD-10-CM

## 2019-03-06 DIAGNOSIS — M25551 Pain in right hip: Secondary | ICD-10-CM

## 2019-03-06 DIAGNOSIS — I4819 Other persistent atrial fibrillation: Secondary | ICD-10-CM

## 2019-03-06 LAB — CBC
HCT: 43.1 % (ref 39.0–52.0)
Hemoglobin: 14 g/dL (ref 13.0–17.0)
MCH: 34.5 pg — ABNORMAL HIGH (ref 26.0–34.0)
MCHC: 32.5 g/dL (ref 30.0–36.0)
MCV: 106.2 fL — ABNORMAL HIGH (ref 80.0–100.0)
Platelets: 173 10*3/uL (ref 150–400)
RBC: 4.06 MIL/uL — ABNORMAL LOW (ref 4.22–5.81)
RDW: 12.7 % (ref 11.5–15.5)
WBC: 7.2 10*3/uL (ref 4.0–10.5)
nRBC: 0 % (ref 0.0–0.2)

## 2019-03-06 LAB — BASIC METABOLIC PANEL
Anion gap: 10 (ref 5–15)
BUN: 39 mg/dL — ABNORMAL HIGH (ref 8–23)
CO2: 28 mmol/L (ref 22–32)
Calcium: 9 mg/dL (ref 8.9–10.3)
Chloride: 97 mmol/L — ABNORMAL LOW (ref 98–111)
Creatinine, Ser: 1.39 mg/dL — ABNORMAL HIGH (ref 0.61–1.24)
GFR calc Af Amer: 58 mL/min — ABNORMAL LOW (ref >60)
GFR calc non Af Amer: 50 mL/min — ABNORMAL LOW (ref >60)
Glucose, Bld: 115 mg/dL — ABNORMAL HIGH (ref 70–99)
Potassium: 3.6 mmol/L (ref 3.5–5.1)
Sodium: 135 mmol/L (ref 135–145)

## 2019-03-06 MED ORDER — SODIUM CHLORIDE 0.9 % IV SOLN: INTRAVENOUS | Status: DC

## 2019-03-06 NOTE — Progress Notes (Signed)
Progress Note   Subjective   Doing well today, the patient denies CP or SOB.  No new concerns  Inpatient Medications    Scheduled Meds: . acetaminophen  1,000 mg Oral TID  . apixaban  5 mg Oral BID  . diltiazem  240 mg Oral Daily  . docusate sodium  100 mg Oral BID  . feeding supplement (ENSURE ENLIVE)  237 mL Oral TID BM  . losartan  50 mg Oral Daily   And  . hydrochlorothiazide  25 mg Oral Daily  . mesalamine  2.4 g Oral Daily  . multivitamin with minerals  1 tablet Oral Daily   Continuous Infusions: . methocarbamol (ROBAXIN) IV     PRN Meds: methocarbamol **OR** methocarbamol (ROBAXIN) IV, morphine injection, oxyCODONE, zolpidem   Vital Signs    Vitals:   03/05/19 0521 03/05/19 1344 03/05/19 2027 03/06/19 0647  BP: 125/74 102/65 (!) 99/55 103/63  Pulse: 99 92 82 92  Resp: 20 18 18 18   Temp: 98.5 F (36.9 C) 98.6 F (37 C) 98.6 F (37 C) 98.5 F (36.9 C)  TempSrc: Oral Oral Oral Oral  SpO2: 97% 95% 96% 96%  Weight:      Height:        Intake/Output Summary (Last 24 hours) at 03/06/2019 0916 Last data filed at 03/06/2019 0859 Gross per 24 hour  Intake 640 ml  Output 200 ml  Net 440 ml   Filed Weights   03/01/19 0743 03/01/19 0747 03/01/19 1830  Weight: 120.3 kg 115.7 kg 118.7 kg    Telemetry    Afib, V rates improved,  Mostly < 100 bpm - Personally Reviewed  Physical Exam   GEN- The patient is well appearing, alert and oriented x 3 today.   Head- normocephalic, atraumatic Eyes-  Sclera clear, conjunctiva pink Ears- hearing intact Oropharynx- clear Neck- supple, Lungs-  normal work of breathing Heart- irregular rate and rhythm  GI- soft  Extremities- no clubbing, cyanosis, or edema  Skin- no rash or lesion Psych- euthymic mood, full affect Neuro- strength and sensation are intact   Labs    Chemistry Recent Labs  Lab 03/01/19 1211 03/02/19 0054 03/03/19 0237  NA 140 139 137  K 4.2 4.3 3.9  CL 103 101 101  CO2 25 30 28   GLUCOSE  126* 126* 103*  BUN 15 11 12   CREATININE 1.01 0.96 0.82  CALCIUM 9.1 8.8* 8.6*  GFRNONAA >60 >60 >60  GFRAA >60 >60 >60  ANIONGAP 12 8 8      Hematology Recent Labs  Lab 03/03/19 0237 03/04/19 0256 03/06/19 0818  WBC 6.4 6.9 7.2  RBC 4.00* 4.15* 4.06*  HGB 13.7 14.5 14.0  HCT 42.1 43.3 43.1  MCV 105.3* 104.3* 106.2*  MCH 34.3* 34.9* 34.5*  MCHC 32.5 33.5 32.5  RDW 12.7 12.3 12.7  PLT 141* 150 173     Patient Profile     Steven Zechman Ramseyis a 74 y.o.malewith a hx of ulcerative colitis, HTN, HL, obesity, OSA, and PAFwho is being seen  for the evaluation of Afib RVRat the request of Dr. Lavella Lemons.    Assessment & Plan    1.  Persistent afib Doing much better with rate control Doing well with eliquis currently chads2vasc score is at least 2  2. HTN Stable No change required today  3. OSA Uses CPAP  Cardiology to be available as needed over the weekend The patient has follow-up already arranged in the AF clinic [place] to discharge from our  standpoint  Thompson Grayer MD, [facility] 03/06/2019 9:23 AM

## 2019-03-06 NOTE — TOC Progression Note (Signed)
Transition of Care [facility]) - Progression Note    Patient Details  Name: Steven Blair MRN: [id number] Date of Birth: January 08, 1946  Transition of Care [facility]) CM/SW [place], [place] Phone Number: 03/06/2019, 1:29 PM  Clinical Narrative:     CSW met with pt at bedside introduced self and role.  Pt gave pt 2 SNF choices. Pt and spouse would like to meet again with CSW on Sunday to discuss other SNF's choices.  TOC will continue to follow for disposition planning.  Expected Discharge Plan: IP Rehab Facility(vs SNF) Barriers to Discharge: Continued Medical Work up, Ship broker  Expected Discharge Plan and Services Expected Discharge Plan: IP Rehab Facility(vs SNF) In-house Referral: Clinical Social Work Discharge Planning Services: CM Consult Post Acute Care Choice: [place], [place] Living arrangements for the past 2 months: [place]                                       Social Determinants of Health (SDOH) Interventions    Readmission Risk Interventions Readmission Risk Prevention Plan 03/05/2019  Post Dischage Appt Not Complete  Appt Comments disposition pending  Medication Screening Complete  Transportation Screening Complete  Some recent data might be hidden

## 2019-03-06 NOTE — Progress Notes (Addendum)
PROGRESS NOTE   Steven Blair  ASN:[id number]    DOB: [birth year]    DOA: 03/01/2019  PCP: Josetta Huddle, MD   I have briefly reviewed patients previous medical records in [facility].  Chief Complaint:   Chief Complaint  Patient presents with  . Hip Pain    Brief Narrative:  74 year old male, lives with spouse, independent of daily activities, PMH of atrial fibrillation on Eliquis, nephrolithiasis, sleep apnea which improved after weight loss, ulcerative colitis, presented to the ED after slipping and falling on black ice on day of admission, immediately developed right hip pain and was unable to get up.  In the ED CT showed right hip acute fracture of the lesser trochanter.  Orthopedic surgery consulted and recommended conservative/nonoperative management with therapies and pain control and outpatient follow-up in a couple of weeks.  Developed hematuria in ED which has resolved.  Hospital course complicated by periodic A. fib with RVR and NSVT, cardiology consulted 2/10.  Patient's insurance has declined CIR admission.  Cardiology adjusting meds for his A. fib.  TOC to explore SNF options.   Assessment & Plan:  Active Problems:   Sleep apnea   Hypertension   Left hip pain   Atrial fibrillation (HCC)   Closed avulsion fracture of lesser trochanter of femur, right, initial encounter ([place])   Hematuria   Nephrolithiasis   Cholelithiasis   Avulsion fracture of condyle of femur (HCC)   Closed evulsion fracture of lesser trochanter of right femur: Seen by orthopedics, recommend nonsurgical/conservative management with pain control and therapies evaluation.  Hospital course complicated by ongoing pain issues which are gradually improving, patient remains on scheduled Tylenol, oral opioid use has decreased, he has not used much IV opioids.  PT reports improved activity tolerance and function today but still required maximum assist to stand from lower surface and to take steps. Patient's  insurance denied CIR.  TOC team to explore SNF options, may not be available until early next week due to insurance nonavailability on the weekend.  He is unsafe to go home.   Acute kidney injury: Creatinine has gone up from 0.8 to on 2/10 up to 1.39 on 2/13.  Did get IV contrast on 2/8 but less likely to be the cause.  Reports poor oral fluid intake, encouraged more.  Got losartan and HCTZ up to this morning (unfortunately when I checked the labs this morning they were not available and by the time I got to recheck it, he had already received these meds)-discontinued.  Brief IV fluids.  Follow BMP in a.m.  Recent imaging without hydronephrosis.  Avoid nephrotoxins. No urinary retention> bladder scan 154 ml.  Hematuria/nephrolithiasis: Patient denies prior history of hematuria.  Follows with Dr. Diona Fanti, [facility].  Etiology of hematuria unclear,?  Related to nephrolithiasis versus trauma from fall.  CT did not show any obstructing lesions.  Hematuria has resolved.  Resumed temporarily held apixaban and monitor closely.  Outpatient follow-up with Dr. Ambrose Pancoast was advised to patient who verbalized understanding.  No recurrence of hematuria.  Left hip osteoarthritis with pain: Reportedly has had both hips replaced by Dr. Shellia Carwin years ago.  Now follows with Dr. Alvan Dame, outpatient follow-up and considering repeat surgery.  Chronic A. fib with intermittent RVR: TTE showed LVEF 45-50% in November 2020.  Failed DCCV in 10/2018.  Cardiology consulted, reportedly followed in their A. fib clinic.  Briefly held apixaban was resumed on 2/10.  Cardizem CD uptitrated to 240 mg daily with much improved and  controlled ventricular rate now.  Cardiology signed off 2/13.  Outpatient follow-up with them.  NSVT: TTE as noted above.  Continue Cardizem CD.  No further episodes noted. History of sleep apnea: Reportedly had BMI of 45, now down to 35.  Continue CPAP.  Outpatient follow-up.  Essential  hypertension: Some soft blood pressures noted.  Discontinued losartan and HCTZ.  Continue Cardizem CD for A. fib.  Thrombocytopenia: Resolved.  Body mass index is 35.49 kg/m.  Obesity   Macrocytosis: Unclear etiology.  Outpatient follow-up.  Nutritional Status Nutrition Problem: Increased nutrient needs Etiology: hip fracture Signs/Symptoms: estimated needs Interventions: Ensure Enlive (each supplement provides 350kcal and 20 grams of protein), Magic cup, MVI  DVT prophylaxis: Resumed Eliquis. Code Status: Full. Family Communication: None at bedside.  Offered again to speak with family but patient declined and stated that he has been updating them. Disposition:  . Patient came from: Home           . Anticipated d/c place: Insurance declined CIR.  Possible SNF. [place] Kitchen Barriers to d/c: Acute kidney injury, new.  Needs IV fluids, follow-up BMP.  Also no SNF or insurance approval yet.  Not likely to DC until early next week   Consultants:   Orthopedics. Cardiology.  Procedures:   None  Antimicrobials:   None   Subjective:  Reports gradually improving right hip pain with increase mobilization.  As per nursing, still unsteady and "shaky" on his feet when ambulating with assistance.  Having regular BMs.  Reports good urine output without difficulty.  Objective:   Vitals:   03/05/19 0521 03/05/19 1344 03/05/19 2027 03/06/19 0647  BP: 125/74 102/65 (!) 99/55 103/63  Pulse: 99 92 82 92  Resp: 20 18 18 18   Temp: 98.5 F (36.9 C) 98.6 F (37 C) 98.6 F (37 C) 98.5 F (36.9 C)  TempSrc: Oral Oral Oral Oral  SpO2: 97% 95% 96% 96%  Weight:      Height:        General exam: Elderly male, moderately built and obese sitting up comfortably in reclining chair.  Oral mucosa moist. Respiratory system: Clear to auscultation.  No increased work of breathing. Cardiovascular system: S1 and S2 heard, irregularly irregular.  No JVD, murmurs or pedal edema.  Telemetry personally reviewed: A.  fib with controlled ventricular rate in the 80s-90s. Gastrointestinal system: Abdomen is nondistended, soft and nontender. No organomegaly or masses felt. Normal bowel sounds heard. Central nervous system: Alert and oriented. No focal neurological deficits. Extremities: Symmetric 5 x 5 power except right lower extremity movements restricted due to pain. Skin: No rashes, lesions or ulcers Psychiatry: Judgement and insight appear normal. Mood & affect appropriate.     Data Reviewed:   I have personally reviewed following labs and imaging studies   CBC: Recent Labs  Lab 03/03/19 0237 03/04/19 0256 03/06/19 0818  WBC 6.4 6.9 7.2  HGB 13.7 14.5 14.0  HCT 42.1 43.3 43.1  MCV 105.3* 104.3* 106.2*  PLT 141* 150 836    Basic Metabolic Panel: Recent Labs  Lab 03/02/19 0054 03/03/19 0237 03/06/19 0818  NA 139 137 135  K 4.3 3.9 3.6  CL 101 101 97*  CO2 30 28 28   GLUCOSE 126* 103* 115*  BUN 11 12 39*  CREATININE 0.96 0.82 1.39*  CALCIUM 8.8* 8.6* 9.0    Liver Function Tests: No results for input(s): AST, ALT, ALKPHOS, BILITOT, PROT, ALBUMIN in the last 168 hours.  CBG: Recent Labs  Lab 03/05/19 2031  GLUCAP  146*    Microbiology Studies:   Recent Results (from the past 240 hour(s))  Respiratory Panel by RT PCR (Flu A&B, Covid) - Nasopharyngeal Swab     Status: None   Collection Time: 03/01/19  4:43 PM   Specimen: Nasopharyngeal Swab  Result Value Ref Range Status   SARS Coronavirus 2 by RT PCR NEGATIVE NEGATIVE Final    Comment: (NOTE) SARS-CoV-2 target nucleic acids are NOT DETECTED. The SARS-CoV-2 RNA is generally detectable in upper respiratoy specimens during the acute phase of infection. The lowest concentration of SARS-CoV-2 viral copies this assay can detect is 131 copies/mL. A negative result does not preclude SARS-Cov-2 infection and should not be used as the sole basis for treatment or other patient management decisions. A negative result may occur with   improper specimen collection/handling, submission of specimen other than nasopharyngeal swab, presence of viral mutation(s) within the areas targeted by this assay, and inadequate number of viral copies (<131 copies/mL). A negative result must be combined with clinical observations, patient history, and epidemiological information. The expected result is Negative. Fact Sheet for Patients:  PinkCheek.be Fact Sheet for Healthcare Providers:  GravelBags.it This test is not yet ap proved or cleared by the [place] FDA and  has been authorized for detection and/or diagnosis of SARS-CoV-2 by FDA under an Emergency Use Authorization (EUA). This EUA will remain  in effect (meaning this test can be used) for the duration of the COVID-19 declaration under Section 564(b)(1) of the Act, 21 U.S.C. section 360bbb-3(b)(1), unless the authorization is terminated or revoked sooner.    Influenza A by PCR NEGATIVE NEGATIVE Final   Influenza B by PCR NEGATIVE NEGATIVE Final    Comment: (NOTE) The Xpert Xpress SARS-CoV-2/FLU/RSV assay is intended as an aid in  the diagnosis of influenza from Nasopharyngeal swab specimens and  should not be used as a sole basis for treatment. Nasal washings and  aspirates are unacceptable for Xpert Xpress SARS-CoV-2/FLU/RSV  testing. Fact Sheet for Patients: PinkCheek.be Fact Sheet for Healthcare Providers: GravelBags.it This test is not yet approved or cleared by the [place] FDA and  has been authorized for detection and/or diagnosis of SARS-CoV-2 by  FDA under an Emergency Use Authorization (EUA). This EUA will remain  in effect (meaning this test can be used) for the duration of the  Covid-19 declaration under Section 564(b)(1) of the Act, 21  U.S.C. section 360bbb-3(b)(1), unless the authorization is  terminated or revoked. Performed at  [facility], [place] [address] [place], [place] [address]      Radiology Studies:  No results found.   Scheduled Meds:   . acetaminophen  1,000 mg Oral TID  . apixaban  5 mg Oral BID  . diltiazem  240 mg Oral Daily  . docusate sodium  100 mg Oral BID  . feeding supplement (ENSURE ENLIVE)  237 mL Oral TID BM  . mesalamine  2.4 g Oral Daily  . multivitamin with minerals  1 tablet Oral Daily    Continuous Infusions:   . sodium chloride    . methocarbamol (ROBAXIN) IV       LOS: 4 days     Vernell Leep, MD, [place], [facility]. [facility]    To contact the attending provider between 7A-7P or the covering provider during after hours 7P-7A, please log into the web site [email or web address] and access using universal [place] password for that web site. If you do not have the password, please call the hospital operator.  03/06/2019, 12:37 PM

## 2019-03-07 LAB — BASIC METABOLIC PANEL
Anion gap: 11 (ref 5–15)
BUN: 34 mg/dL — ABNORMAL HIGH (ref 8–23)
CO2: 26 mmol/L (ref 22–32)
Calcium: 9.2 mg/dL (ref 8.9–10.3)
Chloride: 101 mmol/L (ref 98–111)
Creatinine, Ser: 0.89 mg/dL (ref 0.61–1.24)
GFR calc Af Amer: >60 mL/min (ref >60)
GFR calc non Af Amer: >60 mL/min (ref >60)
Glucose, Bld: 103 mg/dL — ABNORMAL HIGH (ref 70–99)
Potassium: 3.7 mmol/L (ref 3.5–5.1)
Sodium: 138 mmol/L (ref 135–145)

## 2019-03-07 LAB — SARS CORONAVIRUS 2 (TAT 6-24 HRS): SARS Coronavirus 2: NEGATIVE

## 2019-03-07 NOTE — Progress Notes (Signed)
Covid testing done in preparation of discharge tomorrow am to SNF.

## 2019-03-07 NOTE — Progress Notes (Signed)
PROGRESS NOTE   Steven Blair  KDT:[id number]    DOB: 03-16-1945    DOA: 03/01/2019  PCP: Josetta Huddle, MD   I have briefly reviewed patients previous medical records in [facility].  Chief Complaint:   Chief Complaint  Patient presents with  . Hip Pain    Brief Narrative:  74 year old male, lives with spouse, independent of daily activities, PMH of atrial fibrillation on Eliquis, nephrolithiasis, sleep apnea which improved after weight loss, ulcerative colitis, presented to the ED after slipping and falling on black ice on day of admission, immediately developed right hip pain and was unable to get up.  In the ED CT showed right hip acute fracture of the lesser trochanter.  Orthopedic surgery consulted and recommended conservative/nonoperative management with therapies and pain control and outpatient follow-up in a couple of weeks.  Developed hematuria in ED which has resolved.  Hospital course complicated by periodic A. fib with RVR and NSVT, cardiology consulted 2/10.  Patient's insurance has declined CIR admission.  Cardiology adjusting meds for his A. fib.  TOC to explore SNF options.   Assessment & Plan:  Active Problems:   Sleep apnea   Hypertension   Left hip pain   Atrial fibrillation (HCC)   Closed avulsion fracture of lesser trochanter of femur, right, initial encounter ([place])   Hematuria   Nephrolithiasis   Cholelithiasis   Avulsion fracture of condyle of femur (HCC)   Closed evulsion fracture of lesser trochanter of right femur: Seen by orthopedics, recommend nonsurgical/conservative management with pain control and therapies evaluation.  Hospital course complicated by ongoing pain issues which are gradually improving, patient remains on scheduled Tylenol, oral opioid use has decreased, he has not used much IV opioids.  Patient's endurance and activity seems to be improving slowly.  Patient's insurance denied CIR.  TOC exploring SNF option, patient interested in  going to [facility].  Hopefully should be medically ready for discharge tomorrow.  Requested COVID-19 testing in preparation for same.  Acute kidney injury: Creatinine has gone up from 0.8 to on 2/10 up to 1.39 on 2/13.  Suspect due to poor oral intake, ongoing use of losartan and HCTZ and less likely due to IV contrast on 2/8.  No urinary retention by bladder scan.  Briefly hydrated with IV fluids.  Resolved.    Hematuria/nephrolithiasis: Patient denies prior history of hematuria.  Follows with Dr. Diona Fanti, [facility].  Etiology of hematuria unclear,?  Related to nephrolithiasis versus trauma from fall.  CT did not show any obstructing lesions.  Hematuria has resolved.  Resumed temporarily held apixaban and monitor closely.  Outpatient follow-up with Dr. Ambrose Pancoast was advised to patient who verbalized understanding.  No recurrence of hematuria.  Left hip osteoarthritis with pain: Reportedly has had both hips replaced by Dr. Shellia Carwin years ago.  Now follows with Dr. Alvan Dame, outpatient follow-up and considering repeat surgery.  Chronic A. fib with intermittent RVR: TTE showed LVEF 45-50% in November 2020.  Failed DCCV in 10/2018.  Cardiology consulted, reportedly followed in their A. fib clinic.  Briefly held apixaban was resumed on 2/10.  Cardizem CD uptitrated to 240 mg daily with controlled ventricular rate now. Cardiology signed off 2/13.  Outpatient follow-up with them.  NSVT: TTE as noted above.  Continue Cardizem CD.  No further significant episodes noted.  History of sleep apnea: Reportedly had BMI of 45, now down to 35.  Continue CPAP.  Outpatient follow-up.  Essential hypertension: Some soft blood pressures noted.  Discontinued  losartan and HCTZ.  Continue Cardizem CD for A. fib.  Blood pressures improving, consider resuming losartan but leaving off of HCTZ at discharge.  Thrombocytopenia: Resolved.  Body mass index is 35.49 kg/m.  Obesity   Macrocytosis: Unclear etiology.   Outpatient follow-up.  Nutritional Status Nutrition Problem: Increased nutrient needs Etiology: hip fracture Signs/Symptoms: estimated needs Interventions: Ensure Enlive (each supplement provides 350kcal and 20 grams of protein), Magic cup, MVI  DVT prophylaxis: Resumed Eliquis. Code Status: Full. Family Communication: None at bedside.  Offered to speak with family in the past but patient declined and stated that he has been updating them. Disposition:  . Patient came from: Home           . Anticipated d/c place: Insurance declined CIR.  Possible SNF. Marland Kitchen Barriers to d/c: SNF bed availability and insurance approval.   Consultants:   Orthopedics. Cardiology.  Procedures:   None  Antimicrobials:   None   Subjective:  Reports having regular BMs.  Hip pain is less.  Increasing mobility.  No other complaints reported.  Wants to go to [facility].  Objective:   Vitals:   03/06/19 1553 03/06/19 2202 03/07/19 0446 03/07/19 1307  BP: (!) 106/55 (!) 94/57 99/65 123/78  Pulse: 94 91 95 87  Resp: 18 17 17 16   Temp: 98.4 F (36.9 C) 98.7 F (37.1 C) 98.8 F (37.1 C) 99 F (37.2 C)  TempSrc: Oral Oral Oral Oral  SpO2: 99% 94% 98% 99%  Weight:      Height:        General exam: Elderly male, moderately built and obese sitting up comfortably in reclining chair.  Oral mucosa moist. Respiratory system: Clear to auscultation.  No increased work of breathing Cardiovascular system: S1 and S2 heard, irregularly irregular.  No JVD, murmurs or pedal edema.  Telemetry personally reviewed: A. fib with controlled ventricular rate in the 80s-90s. Gastrointestinal system: Abdomen is nondistended, soft and nontender. No organomegaly or masses felt. Normal bowel sounds heard. Central nervous system: Alert and oriented. No focal neurological deficits. Extremities: Symmetric 5 x 5 power except right lower extremity movements restricted due to pain. Skin: No rashes, lesions or ulcers Psychiatry:  Judgement and insight appear normal. Mood & affect appropriate.     Data Reviewed:   I have personally reviewed following labs and imaging studies   CBC: Recent Labs  Lab 03/03/19 0237 03/04/19 0256 03/06/19 0818  WBC 6.4 6.9 7.2  HGB 13.7 14.5 14.0  HCT 42.1 43.3 43.1  MCV 105.3* 104.3* 106.2*  PLT 141* 150 659    Basic Metabolic Panel: Recent Labs  Lab 03/03/19 0237 03/06/19 0818 03/07/19 0728  NA 137 135 138  K 3.9 3.6 3.7  CL 101 97* 101  CO2 28 28 26   GLUCOSE 103* 115* 103*  BUN 12 39* 34*  CREATININE 0.82 1.39* 0.89  CALCIUM 8.6* 9.0 9.2    Liver Function Tests: No results for input(s): AST, ALT, ALKPHOS, BILITOT, PROT, ALBUMIN in the last 168 hours.  CBG: Recent Labs  Lab 03/05/19 2031  GLUCAP 146*    Microbiology Studies:   Recent Results (from the past 240 hour(s))  Respiratory Panel by RT PCR (Flu A&B, Covid) - Nasopharyngeal Swab     Status: None   Collection Time: 03/01/19  4:43 PM   Specimen: Nasopharyngeal Swab  Result Value Ref Range Status   SARS Coronavirus 2 by RT PCR NEGATIVE NEGATIVE Final    Comment: (NOTE) SARS-CoV-2 target nucleic acids are  NOT DETECTED. The SARS-CoV-2 RNA is generally detectable in upper respiratoy specimens during the acute phase of infection. The lowest concentration of SARS-CoV-2 viral copies this assay can detect is 131 copies/mL. A negative result does not preclude SARS-Cov-2 infection and should not be used as the sole basis for treatment or other patient management decisions. A negative result may occur with  improper specimen collection/handling, submission of specimen other than nasopharyngeal swab, presence of viral mutation(s) within the areas targeted by this assay, and inadequate number of viral copies (<131 copies/mL). A negative result must be combined with clinical observations, patient history, and epidemiological information. The expected result is Negative. Fact Sheet for Patients:    PinkCheek.be Fact Sheet for Healthcare Providers:  GravelBags.it This test is not yet ap proved or cleared by the [place] FDA and  has been authorized for detection and/or diagnosis of SARS-CoV-2 by FDA under an Emergency Use Authorization (EUA). This EUA will remain  in effect (meaning this test can be used) for the duration of the COVID-19 declaration under Section 564(b)(1) of the Act, 21 U.S.C. section 360bbb-3(b)(1), unless the authorization is terminated or revoked sooner.    Influenza A by PCR NEGATIVE NEGATIVE Final   Influenza B by PCR NEGATIVE NEGATIVE Final    Comment: (NOTE) The Xpert Xpress SARS-CoV-2/FLU/RSV assay is intended as an aid in  the diagnosis of influenza from Nasopharyngeal swab specimens and  should not be used as a sole basis for treatment. Nasal washings and  aspirates are unacceptable for Xpert Xpress SARS-CoV-2/FLU/RSV  testing. Fact Sheet for Patients: PinkCheek.be Fact Sheet for Healthcare Providers: GravelBags.it This test is not yet approved or cleared by the [place] FDA and  has been authorized for detection and/or diagnosis of SARS-CoV-2 by  FDA under an Emergency Use Authorization (EUA). This EUA will remain  in effect (meaning this test can be used) for the duration of the  Covid-19 declaration under Section 564(b)(1) of the Act, 21  U.S.C. section 360bbb-3(b)(1), unless the authorization is  terminated or revoked. Performed at [facility], [place] [address] [place], [place] [address]      Radiology Studies:  No results found.   Scheduled Meds:   . acetaminophen  1,000 mg Oral TID  . apixaban  5 mg Oral BID  . diltiazem  240 mg Oral Daily  . docusate sodium  100 mg Oral BID  . feeding supplement (ENSURE ENLIVE)  237 mL Oral TID BM  . mesalamine  2.4 g Oral Daily  . multivitamin with minerals  1  tablet Oral Daily    Continuous Infusions:   . methocarbamol (ROBAXIN) IV       LOS: 5 days     Vernell Leep, MD, [place], [facility]. [facility]    To contact the attending provider between 7A-7P or the covering provider during after hours 7P-7A, please log into the web site [email or web address] and access using universal [place] password for that web site. If you do not have the password, please call the hospital operator.  03/07/2019, 1:36 PM

## 2019-03-08 NOTE — Progress Notes (Signed)
PROGRESS NOTE   Steven Blair  LXB:[id number]    DOB: Feb 02, 1945    DOA: 03/01/2019  PCP: Josetta Huddle, MD   I have briefly reviewed patients previous medical records in [facility]  Chief Complaint:   Chief Complaint  Patient presents with  . Hip Pain    Brief Narrative:  74 year old male, lives with spouse, independent of daily activities, PMH of atrial fibrillation on Eliquis, nephrolithiasis, sleep apnea which improved after weight loss, ulcerative colitis, presented to the ED after slipping and falling on black ice on day of admission, immediately developed right hip pain and was unable to get up.  In the ED CT showed right hip acute fracture of the lesser trochanter.  Orthopedic surgery consulted and recommended conservative/nonoperative management with therapies and pain control and outpatient follow-up in a couple of weeks.  Developed hematuria in ED which has resolved.  Hospital course complicated by periodic A. fib with RVR and NSVT, cardiology consulted 2/10.  Patient's insurance has declined CIR admission.  Adjusted A. fib meds and signed off.  Currently medically stable for discharge to SNF when bed available and insurance approval.   Assessment & Plan:  Active Problems:   Sleep apnea   Hypertension   Left hip pain   Atrial fibrillation (HCC)   Closed avulsion fracture of lesser trochanter of femur, right, initial encounter ([place])   Hematuria   Nephrolithiasis   Cholelithiasis   Avulsion fracture of condyle of femur (HCC)   Closed evulsion fracture of lesser trochanter of right femur: Seen by orthopedics, recommend nonsurgical/conservative management with pain control and therapies evaluation.  Hospital course complicated by ongoing pain issues which are gradually improving, patient remains on scheduled Tylenol, oral opioid use has decreased, he has not used much IV opioids.  Patient's endurance and activity seems to be improving slowly.  Patient's insurance denied  CIR.  TOC exploring SNF option, patient interested in going to [facility].  Patient at this time is medically stable for DC to SNF, insurance approval pending.  Repeat COVID-19 testing negative.  Acute kidney injury: Creatinine has gone up from 0.8 to on 2/10 up to 1.39 on 2/13.  Suspect due to poor oral intake, ongoing use of losartan and HCTZ and less likely due to IV contrast on 2/8.  No urinary retention by bladder scan.  Briefly hydrated with IV fluids.  Resolved.    Hematuria/nephrolithiasis: Patient denies prior history of hematuria.  Follows with Dr. Diona Fanti, [facility].  Etiology of hematuria unclear,?  Related to nephrolithiasis versus trauma from fall.  CT did not show any obstructing lesions.  Hematuria has resolved.  Resumed temporarily held apixaban and monitor closely.  Outpatient follow-up with Dr. Ambrose Pancoast was advised to patient who verbalized understanding.  No recurrence of hematuria.  Left hip osteoarthritis with pain: Reportedly has had both hips replaced by Dr. Shellia Carwin years ago.  Now follows with Dr. Alvan Dame, outpatient follow-up and considering repeat surgery.  Chronic A. fib with intermittent RVR: TTE showed LVEF 45-50% in November 2020.  Failed DCCV in 10/2018.  Cardiology consulted, reportedly followed in their A. fib clinic.  Briefly held apixaban was resumed on 2/10.  Cardizem CD uptitrated to 240 mg daily with controlled ventricular rate now. Cardiology signed off 2/13.  Outpatient follow-up with them.  Stable with controlled ventricular rate.  NSVT: TTE as noted above.  Continue Cardizem CD.  No further significant episodes noted.  History of sleep apnea: Reportedly had BMI of 45, now down to  35.  Continue CPAP.  Outpatient follow-up.  Essential hypertension: Some soft blood pressures noted.  Discontinued losartan and HCTZ.  Continue Cardizem CD for A. fib.  Blood pressures improved and controlled.  Would resume losartan but leave off of HCTZ at  discharge.  Thrombocytopenia: Resolved.  Body mass index is 35.49 kg/m.  Obesity   Macrocytosis: Unclear etiology.  Outpatient follow-up.  Nutritional Status Nutrition Problem: Increased nutrient needs Etiology: hip fracture Signs/Symptoms: estimated needs Interventions: Ensure Enlive (each supplement provides 350kcal and 20 grams of protein), Magic cup, MVI  DVT prophylaxis: Resumed Eliquis. Code Status: Full. Family Communication: None at bedside.  Offered to speak with family multiple times but patient declined and stated that he has been updating them. Disposition:  . Patient came from: Home           . Anticipated d/c place: SNF . Barriers to d/c: SNF pending insurance approval, hopefully 2/16.   Consultants:   Orthopedics. Cardiology.  Procedures:   None  Antimicrobials:   None   Subjective:  No new complaints reported.  Reports gradual improvement in pain but still difficulty ambulating due to pain beyond a couple of steps.  Ready to go to SNF.  Objective:   Vitals:   03/07/19 1307 03/07/19 2106 03/08/19 0511 03/08/19 1522  BP: 123/78 112/63 118/72 115/66  Pulse: 87 87 88 86  Resp: 16 18 20 18   Temp: 99 F (37.2 C) 99.2 F (37.3 C) 98.8 F (37.1 C) 99.5 F (37.5 C)  TempSrc: Oral Oral Oral Oral  SpO2: 99% 98% 97% 93%  Weight:      Height:        General exam: Elderly male, moderately built and obese sitting up comfortably in reclining chair.  Oral mucosa moist. Respiratory system: Clear to auscultation.  No increased work of breathing Cardiovascular system: S1 and S2 heard, irregularly irregular.  No JVD, murmurs or pedal edema.  Telemetry personally reviewed: Controlled ventricular rate in the 80s-90s. Gastrointestinal system: Abdomen is nondistended, soft and nontender. No organomegaly or masses felt. Normal bowel sounds heard. Central nervous system: Alert and oriented. No focal neurological deficits. Extremities: Symmetric 5 x 5 power except  restricted movements in the right lower extremity due to pain.  No pedal edema. Skin: No rashes, lesions or ulcers Psychiatry: Judgement and insight appear normal. Mood & affect appropriate.     Data Reviewed:   I have personally reviewed following labs and imaging studies   CBC: Recent Labs  Lab 03/03/19 0237 03/04/19 0256 03/06/19 0818  WBC 6.4 6.9 7.2  HGB 13.7 14.5 14.0  HCT 42.1 43.3 43.1  MCV 105.3* 104.3* 106.2*  PLT 141* 150 194    Basic Metabolic Panel: Recent Labs  Lab 03/03/19 0237 03/06/19 0818 03/07/19 0728  NA 137 135 138  K 3.9 3.6 3.7  CL 101 97* 101  CO2 28 28 26   GLUCOSE 103* 115* 103*  BUN 12 39* 34*  CREATININE 0.82 1.39* 0.89  CALCIUM 8.6* 9.0 9.2    Liver Function Tests: No results for input(s): AST, ALT, ALKPHOS, BILITOT, PROT, ALBUMIN in the last 168 hours.  CBG: Recent Labs  Lab 03/05/19 2031  GLUCAP 146*    Microbiology Studies:   Recent Results (from the past 240 hour(s))  Respiratory Panel by RT PCR (Flu A&B, Covid) - Nasopharyngeal Swab     Status: None   Collection Time: 03/01/19  4:43 PM   Specimen: Nasopharyngeal Swab  Result Value Ref Range Status  SARS Coronavirus 2 by RT PCR NEGATIVE NEGATIVE Final    Comment: (NOTE) SARS-CoV-2 target nucleic acids are NOT DETECTED. The SARS-CoV-2 RNA is generally detectable in upper respiratoy specimens during the acute phase of infection. The lowest concentration of SARS-CoV-2 viral copies this assay can detect is 131 copies/mL. A negative result does not preclude SARS-Cov-2 infection and should not be used as the sole basis for treatment or other patient management decisions. A negative result may occur with  improper specimen collection/handling, submission of specimen other than nasopharyngeal swab, presence of viral mutation(s) within the areas targeted by this assay, and inadequate number of viral copies (<131 copies/mL). A negative result must be combined with  clinical observations, patient history, and epidemiological information. The expected result is Negative. Fact Sheet for Patients:  PinkCheek.be Fact Sheet for Healthcare Providers:  GravelBags.it This test is not yet ap proved or cleared by the [place] FDA and  has been authorized for detection and/or diagnosis of SARS-CoV-2 by FDA under an Emergency Use Authorization (EUA). This EUA will remain  in effect (meaning this test can be used) for the duration of the COVID-19 declaration under Section 564(b)(1) of the Act, 21 U.S.C. section 360bbb-3(b)(1), unless the authorization is terminated or revoked sooner.    Influenza A by PCR NEGATIVE NEGATIVE Final   Influenza B by PCR NEGATIVE NEGATIVE Final    Comment: (NOTE) The Xpert Xpress SARS-CoV-2/FLU/RSV assay is intended as an aid in  the diagnosis of influenza from Nasopharyngeal swab specimens and  should not be used as a sole basis for treatment. Nasal washings and  aspirates are unacceptable for Xpert Xpress SARS-CoV-2/FLU/RSV  testing. Fact Sheet for Patients: PinkCheek.be Fact Sheet for Healthcare Providers: GravelBags.it This test is not yet approved or cleared by the [place] FDA and  has been authorized for detection and/or diagnosis of SARS-CoV-2 by  FDA under an Emergency Use Authorization (EUA). This EUA will remain  in effect (meaning this test can be used) for the duration of the  Covid-19 declaration under Section 564(b)(1) of the Act, 21  U.S.C. section 360bbb-3(b)(1), unless the authorization is  terminated or revoked. Performed at [facility], [place] [address], [place], [place] [address]   SARS CORONAVIRUS 2 (TAT 6-24 HRS) Nasopharyngeal Nasopharyngeal Swab     Status: None   Collection Time: 03/07/19  6:16 PM   Specimen: Nasopharyngeal Swab  Result Value Ref Range Status   SARS  Coronavirus 2 NEGATIVE NEGATIVE Final    Comment: (NOTE) SARS-CoV-2 target nucleic acids are NOT DETECTED. The SARS-CoV-2 RNA is generally detectable in upper and lower respiratory specimens during the acute phase of infection. Negative results do not preclude SARS-CoV-2 infection, do not rule out co-infections with other pathogens, and should not be used as the sole basis for treatment or other patient management decisions. Negative results must be combined with clinical observations, patient history, and epidemiological information. The expected result is Negative. Fact Sheet for Patients: SugarRoll.be Fact Sheet for Healthcare Providers: https://www.woods-mathews.com/ This test is not yet approved or cleared by the [place] FDA and  has been authorized for detection and/or diagnosis of SARS-CoV-2 by FDA under an Emergency Use Authorization (EUA). This EUA will remain  in effect (meaning this test can be used) for the duration of the COVID-19 declaration under Section 56 4(b)(1) of the Act, 21 U.S.C. section 360bbb-3(b)(1), unless the authorization is terminated or revoked sooner. Performed at [facility], [place] [address], [place],  [address]  Radiology Studies:  No results found.   Scheduled Meds:   . acetaminophen  1,000 mg Oral TID  . apixaban  5 mg Oral BID  . diltiazem  240 mg Oral Daily  . docusate sodium  100 mg Oral BID  . feeding supplement (ENSURE ENLIVE)  237 mL Oral TID BM  . mesalamine  2.4 g Oral Daily  . multivitamin with minerals  1 tablet Oral Daily    Continuous Infusions:   . methocarbamol (ROBAXIN) IV       LOS: 6 days     Vernell Leep, MD, [place], [facility]. [facility]    To contact the attending provider between 7A-7P or the covering provider during after hours 7P-7A, please log into the web site [email or web address] and access using universal [place] password for that web  site. If you do not have the password, please call the hospital operator.  03/08/2019, 7:40 PM

## 2019-03-08 NOTE — TOC Progression Note (Addendum)
Transition of Care [facility]) - Progression Note    Patient Details  Name: Steven Blair MRN: [id number] Date of Birth: 02-Jul-1945  [facility] CM/SW [place], [place] Phone Number: 03/08/2019, 7:04 AM  Clinical Narrative: 9:40am- Confirmed bed availability with [facility] and spoke with Steven Blair again, she is updating pt with all these details. Explained that now we wait for insurance approval. Pt wife has my number for any further needs/questions.   8:47am- CSW called and spoke with [name], she and pt had several concerns with weekend TOC communication. CSW apologized on behalf of team, provided [facility] supervisor number for her to communicate these concerns with Denyse Amass, [employer].   This Probation officer shared that [place] had received a message from [facility] offering placement as pt PCP is attending MD at their facility. Steven Blair states that they spoke with Dr. Inda Merlin and are interested in SNF placement at [facility] if they are still able to offer. CSW to reach out to SNF admissions staff to inquire about bed availability. CSW and Steven Blair also discussed that we will need to initiate inusrance authorization therefore likely that dc may be tomorrow morning unless we receive determination from [facility] today. CSW awaits call back from [facility] and then will f/u with pt and pt wife.      7:04am- Per notes from this weekend pt still requesting time to review offers. TOC team will review with pt to discuss placement- pt will still need insurance approval for placement at this time. Top choices per Baxter Flattery, CSW, were [facility] and [facility]. TOC team will f/u for choice to initiate insurance approval.    Expected Discharge Plan: IP Rehab Facility(vs SNF) Barriers to Discharge: Continued Medical Work up, Ship broker  Expected Discharge Plan and Services Expected Discharge Plan: IP Rehab Facility(vs SNF) In-house Referral: Clinical Social  Work Discharge Planning Services: CM Consult Post Acute Care Choice: [place], [place] arrangements for the past 2 months: [place]  Readmission Risk Interventions Readmission Risk Prevention Plan 03/05/2019  Post Dischage Appt Not Complete  Appt Comments disposition pending  Medication Screening Complete  Transportation Screening Complete  Some recent data might be hidden

## 2019-03-08 NOTE — Progress Notes (Signed)
Physical Therapy Treatment Patient Details Name: Steven Blair MRN: [id number] DOB: 04-21-1945 Today's Date: 03/08/2019    History of Present Illness 74 yo admitted after fall on black ice with right trochanter fx managed conservatively. Pt also with left hip pain due to OA. Pt with hematuria in ED with nephrolithiasis. PMHx: bil THA, Afib, sleep apnea, ulcerative colitis    PT Comments    Pt received resting in bed and eager for PT. Pt reporting he had been up in the recliner earlier today and to the restroom with nursing using stedy. Pt reporting improved transfers with stedy. Pt is progressing well towards PT goals. Pt performed supine and seated LE therex for improved strength and ROM in RLE. Pt encouraged to perform therex on his own. Pt required min A to get EOB and mod A for transfers. On second trial STS from elevated bed pt requiring  Min A to stand. Pt ambulated a few feet away from bed and backwards steps to return to bed. Pt required min A with therapist blocking right knee. Pt with one instance of mild right knee buckling. Pt reporting feeling more comfortable with ambulation this session and very pleased with progress made today. Pt then working on standing weight shifts for improved weight acceptance on RLE during gait and standing marching with therapist blocking right knee throughout with no buckling noted.  Pt reporting 3/10 end of session. Pt reporting he should be d/c to STR tomorrow and is optimistic for further mobility progression. Pt will cont to benefit from skilled PT to continue improving deficits and progressing mobility. D/c recommendation below is appropriate.     Follow Up Recommendations  SNF     Equipment Recommendations  Other (comment)(TBD next venue of care)    Recommendations for Other Services       Precautions / Restrictions Precautions Precautions: Fall Precaution Comments: unpredictable RLE muscle spasms Restrictions Weight Bearing Restrictions:  Yes RLE Weight Bearing: Weight bearing as tolerated    Mobility  Bed Mobility Overal bed mobility: Needs Assistance Bed Mobility: Sit to Supine;Supine to Sit     Supine to sit: Min assist;HOB elevated;Mod assist Sit to supine: Min assist;Mod assist   General bed mobility comments: min-mod physical assist to get EOB and return to supine, physical assist for LE advancement and trunk elevation, cuing for technique, increased time and effort, pt able to scoot up in bed with use of top bed rail and cuing for bridging with LEs  Transfers Overall transfer level: Needs assistance Equipment used: Rolling walker (2 wheeled) Transfers: Sit to/from Stand Sit to Stand: Mod assist;From elevated surface         General transfer comment: pt required mod A to rise from elevated, cuing for hand placement with good carryover on second trial, on second trial only min-light mod A to rise (bed slightly more elevated than first trial)  Ambulation/Gait Ambulation/Gait assistance: Min assist Gait Distance (Feet): 5 Feet Assistive device: Rolling walker (2 wheeled) Gait Pattern/deviations: Step-to pattern;Decreased weight shift to right;Shuffle;Decreased stance time - right Gait velocity: decreased   General Gait Details: pt able to step forward from bed and then backwards returning to bed, right knee blocked throghout with one mild instance of buckling, increased time and effort, heavy reliance on B UE support on RW, pt reporting feeling much better about ambulation and very impressed with progress so far   Marine scientist  [place] (Stroke Patients Only)       Balance Overall balance assessment: Needs assistance Sitting-balance support: Feet supported;No upper extremity supported Sitting balance-Leahy Scale: Fair     Standing balance support: Bilateral upper extremity supported Standing balance-Leahy Scale: Poor Standing balance comment: heavy  reliance on RW, physical assist to block right knee                            Cognition Arousal/Alertness: Awake/alert Behavior During Therapy: WFL for tasks assessed/performed Overall Cognitive Status: Within Functional Limits for tasks assessed                                        Exercises Total Joint Exercises Ankle Circles/Pumps: AROM;Both;10 reps Quad Sets: AROM;Right;10 reps Gluteal Sets: AROM;Both;10 reps Towel Squeeze: AROM;Both;10 reps Heel Slides: AROM;Right;10 reps Hip ABduction/ADduction: AAROM;Right;10 reps Long Arc Quad: AROM;Both;10 reps(limited ROM on right) Marching in Standing: AROM;Both;10 reps;Seated;5 reps;Standing(limited ROM on right) Other Exercises Other Exercises: standing lateral weight shifts with right knee blocked for improved weight acceptance during stance time during gait, no buckling noted on RLE Other Exercises: standing marching x5 for carryover to gait with improved gait pattern, right knee blocked with no buckling noted    General Comments        Pertinent Vitals/Pain Faces Pain Scale: Hurts a little bit Pain Location: R hip d/t spasms Pain Descriptors / Indicators: Aching;Spasm;Grimacing;Guarding Pain Intervention(s): Limited activity within patient's tolerance;Monitored during session;Premedicated before session;Repositioned;Ice applied    Home Living                      Prior Function            PT Goals (current goals can now be found in the care plan section) Progress towards PT goals: Progressing toward goals    Frequency    Min 5X/week      PT Plan Discharge plan needs to be updated    Co-evaluation              AM-PAC PT "6 Clicks" Mobility   Outcome Measure  Help needed turning from your back to your side while in a flat bed without using bedrails?: A Lot Help needed moving from lying on your back to sitting on the side of a flat bed without using bedrails?: A  Lot Help needed moving to and from a bed to a chair (including a wheelchair)?: Total Help needed standing up from a chair using your arms (e.g., wheelchair or bedside chair)?: Total Help needed to walk in hospital room?: Total Help needed climbing 3-5 steps with a railing? : Total 6 Click Score: 8    End of Session Equipment Utilized During Treatment: Gait belt Activity Tolerance: Patient tolerated treatment well Patient left: in bed;with call bell/phone within reach;with bed alarm set Nurse Communication: Mobility status PT Visit Diagnosis: Other abnormalities of gait and mobility (R26.89);History of falling (Z91.81);Muscle weakness (generalized) (M62.81);Pain Pain - Right/Left: Right Pain - part of body: Hip     Time: 8882-8003 PT Time Calculation (min) (ACUTE ONLY): 36 min  Charges:  $Therapeutic Exercise: 8-22 mins $Therapeutic Activity: 8-22 mins                     Zachary George PT, DPT 4:34 PM,03/08/19    Ahmya Bernick Drucilla Chalet 03/08/2019, 4:29 PM

## 2019-03-09 DIAGNOSIS — S72491D Other fracture of lower end of right femur, subsequent encounter for closed fracture with routine healing: Secondary | ICD-10-CM

## 2019-03-09 DIAGNOSIS — I4811 Longstanding persistent atrial fibrillation: Secondary | ICD-10-CM

## 2019-03-09 DIAGNOSIS — G4730 Sleep apnea, unspecified: Secondary | ICD-10-CM

## 2019-03-09 LAB — SARS CORONAVIRUS 2 (TAT 6-24 HRS): SARS Coronavirus 2: NEGATIVE

## 2019-03-09 NOTE — Progress Notes (Signed)
Nutrition Follow-up  DOCUMENTATION CODES:   Obesity unspecified  INTERVENTION:   -Continue MVI with minerals daily -Continue Ensure Enlive po TID, each supplement provides 350 kcal and 20 grams of protein  NUTRITION DIAGNOSIS:   Increased nutrient needs related to hip fracture as evidenced by estimated needs.  Ongoing  GOAL:   Patient will meet greater than or equal to 90% of their needs  Progressing   MONITOR:   PO intake, Supplement acceptance, Labs, Weight trends, Skin, I & O's  REASON FOR ASSESSMENT:   Consult Assessment of nutrition requirement/status  ASSESSMENT:   Steven Blair is a 74 y.o. male with medical history significant of atrial fibrillation, nephrolithiasis, sleep apnea9 improved with weight loss) ulcerative colitis who presents to the emergency department after slipping and falling on black ice this morning.  He had immediate right hip pain and could not get up.  He came to the ED where CT scan showed an acute fracture of the lesser trochanter.  He also states that when he went to get his Covid vaccine last Friday stood in line a long time and developed left hip pain for which she was going to speak with the orthopedist today.  Unfortunately T scan showed a fracture of the lesser trochanter.  Orthopedic surgery saw the patient and felt that conservative management with physical therapy and pain control was in order and referred the patient to [place].  They plan to evaluate the patient in a couple weeks as an outpatient and consider appropriate timing for left hip replacement which needs to be done.  Reviewed I/O's: -450 ml x 24 hours and +2.1 L since admission  UOP: 450 ml x 24 hours  Pt working with therapy at time of visit.   Appetite is improving; noted meal completion 25-75%. Pt remains compliant with Ensure Enlive supplements.   Plan to d/c to SNF once insurance authorization has been obtained.   Labs reviewed.   Diet Order:   Diet Order             Diet Heart Room service appropriate? Yes; Fluid consistency: Thin  Diet effective now              EDUCATION NEEDS:   No education needs have been identified at this time  Skin:  Skin Assessment: Reviewed RN Assessment  Last BM:  03/07/19  Height:   Ht Readings from Last 1 Encounters:  03/01/19 6' (1.829 m)    Weight:   Wt Readings from Last 1 Encounters:  03/01/19 118.7 kg    Ideal Body Weight:  80.9 kg  BMI:  Body mass index is 35.49 kg/m.  Estimated Nutritional Needs:   Kcal:  2200-2400  Protein:  120-135 grams  Fluid:  > 2.2 L    [name], RD, LDN, [place] Registered Dietitian II Certified Diabetes Care and Education Specialist Please refer to [facility] for RD and/or RD on-call/weekend/after hours pager

## 2019-03-09 NOTE — Progress Notes (Signed)
PROGRESS NOTE    Steven Blair  MHD:[id number] DOB: [birth year] DOA: 03/01/2019 PCP: Josetta Huddle, MD   Brief Narrative:  74 year old with history of kidney stones, obstructive sleep apnea, A. fib on Eliquis, ulcerative colitis came to the hospital after falling on black ice.  CT showed acute right hip fracture of lesser trochanter.  Orthopedic recommended now conservative management.  Hospital course complicated by atrial fibrillation with RVR.  Appears to be stable for now, PT recommending SNF.   Assessment & Plan:   Active Problems:   Sleep apnea   Hypertension   Left hip pain   Atrial fibrillation (HCC)   Closed avulsion fracture of lesser trochanter of femur, right, initial encounter ([place])   Hematuria   Nephrolithiasis   Cholelithiasis   Avulsion fracture of condyle of femur (HCC)  Acute closed avulsion fracture left trochanteric of the right femur -Nonoperative management per orthopedic.  Pain control. -PT recommending SNF, arrangements to be made.  TOC team working on this.  Acute kidney injury -This is resolved with gentle hydration.  Baseline creatinine 0.8, now again 0.8  Hematuria/history of kidney stones -Resolved.  Follow-up outpatient with Dr. Diona Fanti from urology  Atrial fibrillation with intermittent RVR, chronic -Echocardiogram shows EF of 50% in November 2020.  Seen by cardiology, Cardizem increased to 240 mg daily.  Continue Eliquis.  History of obstructive sleep apnea -CPAP at bedtime  Essential hypertension -Currently on Cardizem.  Losartan and hydrochlorothiazide on hold.   DVT prophylaxis: Eliquis Code Status: Full code Family Communication: None Disposition Plan:   Patient From= home  Patient Anticipated D/C place= SNF  Barriers= pending insurance approval.  Repeat Covid testing has been ordered again for placement.   Subjective: No complaints today, feels okay.  Getting ready to work with physical therapy.  Review of  Systems Otherwise negative except as per HPI, including: General: Denies fever, chills, night sweats or unintended weight loss. Resp: Denies cough, wheezing, shortness of breath. Cardiac: Denies chest pain, palpitations, orthopnea, paroxysmal nocturnal dyspnea. GI: Denies abdominal pain, nausea, vomiting, diarrhea or constipation GU: Denies dysuria, frequency, hesitancy or incontinence MS: Denies muscle aches, joint pain or swelling Neuro: Denies headache, neurologic deficits (focal weakness, numbness, tingling), abnormal gait Psych: Denies anxiety, depression, SI/HI/AVH Skin: Denies new rashes or lesions ID: Denies sick contacts, exotic exposures, travel  Examination:  General exam: Appears calm and comfortable  Respiratory system: Clear to auscultation. Respiratory effort normal. Cardiovascular system: S1 & S2 heard, RRR. No JVD, murmurs, rubs, gallops or clicks. No pedal edema. Gastrointestinal system: Abdomen is nondistended, soft and nontender. No organomegaly or masses felt. Normal bowel sounds heard. Central nervous system: Alert and oriented. No focal neurological deficits. Extremities: Symmetric 5 x 5 power. Skin: No rashes, lesions or ulcers Psychiatry: Judgement and insight appear normal. Mood & affect appropriate.     Objective: Vitals:   03/08/19 0511 03/08/19 1522 03/08/19 2016 03/09/19 0439  BP: 118/72 115/66 110/61 130/82  Pulse: 88 86 86 87  Resp: 20 18 16 14   Temp: 98.8 F (37.1 C) 99.5 F (37.5 C) 99.3 F (37.4 C) 98 F (36.7 C)  TempSrc: Oral Oral Oral Oral  SpO2: 97% 93% 98% 98%  Weight:      Height:        Intake/Output Summary (Last 24 hours) at 03/09/2019 1206 Last data filed at 03/09/2019 0617 Gross per 24 hour  Intake --  Output 450 ml  Net -450 ml   Filed Weights   03/01/19 0743 03/01/19 0747  03/01/19 1830  Weight: 120.3 kg 115.7 kg 118.7 kg     Data Reviewed:   CBC: Recent Labs  Lab 03/03/19 0237 03/04/19 0256 03/06/19 0818   WBC 6.4 6.9 7.2  HGB 13.7 14.5 14.0  HCT 42.1 43.3 43.1  MCV 105.3* 104.3* 106.2*  PLT 141* 150 480   Basic Metabolic Panel: Recent Labs  Lab 03/03/19 0237 03/06/19 0818 03/07/19 0728  NA 137 135 138  K 3.9 3.6 3.7  CL 101 97* 101  CO2 28 28 26   GLUCOSE 103* 115* 103*  BUN 12 39* 34*  CREATININE 0.82 1.39* 0.89  CALCIUM 8.6* 9.0 9.2   GFR: Estimated Creatinine Clearance: 98.3 mL/min (by C-G formula based on SCr of 0.89 mg/dL). Liver Function Tests: No results for input(s): AST, ALT, ALKPHOS, BILITOT, PROT, ALBUMIN in the last 168 hours. No results for input(s): LIPASE, AMYLASE in the last 168 hours. No results for input(s): AMMONIA in the last 168 hours. Coagulation Profile: No results for input(s): INR, PROTIME in the last 168 hours. Cardiac Enzymes: No results for input(s): CKTOTAL, CKMB, CKMBINDEX, TROPONINI in the last 168 hours. BNP (last 3 results) No results for input(s): PROBNP in the last 8760 hours. HbA1C: No results for input(s): HGBA1C in the last 72 hours. CBG: Recent Labs  Lab 03/05/19 2031  GLUCAP 146*   Lipid Profile: No results for input(s): CHOL, HDL, LDLCALC, TRIG, CHOLHDL, LDLDIRECT in the last 72 hours. Thyroid Function Tests: No results for input(s): TSH, T4TOTAL, FREET4, T3FREE, THYROIDAB in the last 72 hours. Anemia Panel: No results for input(s): VITAMINB12, FOLATE, FERRITIN, TIBC, IRON, RETICCTPCT in the last 72 hours. Sepsis Labs: No results for input(s): PROCALCITON, LATICACIDVEN in the last 168 hours.  Recent Results (from the past 240 hour(s))  Respiratory Panel by RT PCR (Flu A&B, Covid) - Nasopharyngeal Swab     Status: None   Collection Time: 03/01/19  4:43 PM   Specimen: Nasopharyngeal Swab  Result Value Ref Range Status   SARS Coronavirus 2 by RT PCR NEGATIVE NEGATIVE Final    Comment: (NOTE) SARS-CoV-2 target nucleic acids are NOT DETECTED. The SARS-CoV-2 RNA is generally detectable in upper respiratoy specimens during  the acute phase of infection. The lowest concentration of SARS-CoV-2 viral copies this assay can detect is 131 copies/mL. A negative result does not preclude SARS-Cov-2 infection and should not be used as the sole basis for treatment or other patient management decisions. A negative result may occur with  improper specimen collection/handling, submission of specimen other than nasopharyngeal swab, presence of viral mutation(s) within the areas targeted by this assay, and inadequate number of viral copies (<131 copies/mL). A negative result must be combined with clinical observations, patient history, and epidemiological information. The expected result is Negative. Fact Sheet for Patients:  PinkCheek.be Fact Sheet for Healthcare Providers:  GravelBags.it This test is not yet ap proved or cleared by the [place] FDA and  has been authorized for detection and/or diagnosis of SARS-CoV-2 by FDA under an Emergency Use Authorization (EUA). This EUA will remain  in effect (meaning this test can be used) for the duration of the COVID-19 declaration under Section 564(b)(1) of the Act, 21 U.S.C. section 360bbb-3(b)(1), unless the authorization is terminated or revoked sooner.    Influenza A by PCR NEGATIVE NEGATIVE Final   Influenza B by PCR NEGATIVE NEGATIVE Final    Comment: (NOTE) The Xpert Xpress SARS-CoV-2/FLU/RSV assay is intended as an aid in  the diagnosis of influenza from Nasopharyngeal swab  specimens and  should not be used as a sole basis for treatment. Nasal washings and  aspirates are unacceptable for Xpert Xpress SARS-CoV-2/FLU/RSV  testing. Fact Sheet for Patients: PinkCheek.be Fact Sheet for Healthcare Providers: GravelBags.it This test is not yet approved or cleared by the [place] FDA and  has been authorized for detection and/or diagnosis of  SARS-CoV-2 by  FDA under an Emergency Use Authorization (EUA). This EUA will remain  in effect (meaning this test can be used) for the duration of the  Covid-19 declaration under Section 564(b)(1) of the Act, 21  U.S.C. section 360bbb-3(b)(1), unless the authorization is  terminated or revoked. Performed at [facility], [place] [address], [place], [place] [address]   SARS CORONAVIRUS 2 (TAT 6-24 HRS) Nasopharyngeal Nasopharyngeal Swab     Status: None   Collection Time: 03/07/19  6:16 PM   Specimen: Nasopharyngeal Swab  Result Value Ref Range Status   SARS Coronavirus 2 NEGATIVE NEGATIVE Final    Comment: (NOTE) SARS-CoV-2 target nucleic acids are NOT DETECTED. The SARS-CoV-2 RNA is generally detectable in upper and lower respiratory specimens during the acute phase of infection. Negative results do not preclude SARS-CoV-2 infection, do not rule out co-infections with other pathogens, and should not be used as the sole basis for treatment or other patient management decisions. Negative results must be combined with clinical observations, patient history, and epidemiological information. The expected result is Negative. Fact Sheet for Patients: SugarRoll.be Fact Sheet for Healthcare Providers: https://www.woods-mathews.com/ This test is not yet approved or cleared by the [place] FDA and  has been authorized for detection and/or diagnosis of SARS-CoV-2 by FDA under an Emergency Use Authorization (EUA). This EUA will remain  in effect (meaning this test can be used) for the duration of the COVID-19 declaration under Section 56 4(b)(1) of the Act, 21 U.S.C. section 360bbb-3(b)(1), unless the authorization is terminated or revoked sooner. Performed at [facility], [place] [address] [place], [place] [address]          Radiology Studies: No results found.      Scheduled Meds: . acetaminophen  1,000 mg Oral TID  .  apixaban  5 mg Oral BID  . diltiazem  240 mg Oral Daily  . docusate sodium  100 mg Oral BID  . feeding supplement (ENSURE ENLIVE)  237 mL Oral TID BM  . mesalamine  2.4 g Oral Daily  . multivitamin with minerals  1 tablet Oral Daily   Continuous Infusions: . methocarbamol (ROBAXIN) IV       LOS: 7 days   Time spent= 25 mins    Jenayah Antu Arsenio Loader, MD [facility]  If 7PM-7AM, please contact night-coverage  03/09/2019, 12:06 PM

## 2019-03-09 NOTE — Progress Notes (Signed)
Occupational Therapy Treatment Patient Details Name: Steven Blair MRN: [id number] DOB: 05/01/1945 Today's Date: 03/09/2019    History of present illness 74 yo admitted after fall on black ice with right trochanter fx managed conservatively. Pt also with left hip pain due to OA. Pt with hematuria in ED with nephrolithiasis. PMHx: bil THA, Afib, sleep apnea, ulcerative colitis   OT comments  Pt making good progress with functional goals. Session focused on LB ADLs/selfcare with A/E use, sit - stand transitions using RW for toilet transfer and ADL mobility. Pt requiring mod A + 2 for SPTs  from recliner and improving to min A on 2nd and 3rd stand from recliner and low bed. Pt required min A + 2 for safety for SPT to bed from recliner. Pt educated on chair push ups for B UE triceps strengthening for sit- stand transitions  Pt very encouraged with increased confidence at end of session on progression. OT will continue to follow acutely  Follow Up Recommendations  SNF    Equipment Recommendations  Other (comment)(TBD at SNF)    Recommendations for Other Services      Precautions / Restrictions Precautions Precautions: Fall Precaution Comments: unpredictable RLE muscle spasms Restrictions Weight Bearing Restrictions: Yes RLE Weight Bearing: Weight bearing as tolerated       Mobility Bed Mobility Overal bed mobility: Needs Assistance Bed Mobility: Sit to Supine       Sit to supine: Min assist;HOB elevated;+2 for physical assistance   General bed mobility comments: min A +2 to return to supine end of session, pt received sitting in recliner, assist for LE advancement, increased time and effort  Transfers Overall transfer level: Needs assistance Equipment used: Rolling walker (2 wheeled) Transfers: Sit to/from [facility] Sit to Stand: Min assist;Mod assist;+2 physical assistance Stand pivot transfers: Min assist;+2 safety/equipment;+2 physical assistance        General transfer comment: x3 trials, x2 from recliner and x1 from bed in lower position, initially mod A x2 to rise from recliner with cuing required for hand placement and shifting hips forward to achieve full upright standing, second attempt with improved effort and carryover requiring decreased assist, third STS from bed in lower position from yesterday pt required min A x2 with mostly CGA from second person to achieve full standing, light min A x2 to complete stand pivot from recliner to bed after second trial STS    Balance Overall balance assessment: Needs assistance Sitting-balance support: Feet supported;No upper extremity supported Sitting balance-Leahy Scale: Fair Sitting balance - Comments: minguard EOB   Standing balance support: Bilateral upper extremity supported;During functional activity Standing balance-Leahy Scale: Fair Standing balance comment: heavy reliance on RW, physical assist to block right knee                           ADL either performed or assessed with clinical judgement   ADL Overall ADL's : Needs assistance/impaired Eating/Feeding: Independent;Sitting           Lower Body Bathing: Maximal assistance;Moderate assistance;Sitting/lateral leans Lower Body Bathing Details (indicate cue type and reason): simulated     Lower Body Dressing: Moderate assistance;Sitting/lateral leans Lower Body Dressing Details (indicate cue type and reason): reveiwed LB dressing with A/E Toilet Transfer: Moderate assistance;Minimal assistance;+2 for physical assistance;Ambulation;Stand-pivot;RW   Toileting- Water quality scientist and Hygiene: Sit to/from stand;Moderate assistance       Functional mobility during ADLs: Moderate assistance;Minimal assistance;+2 for physical assistance;Rolling walker  Vision Baseline Vision/History: No visual deficits Patient Visual Report: No change from baseline     Perception     Praxis      Cognition  Arousal/Alertness: Awake/alert Behavior During Therapy: WFL for tasks assessed/performed Overall Cognitive Status: Within Functional Limits for tasks assessed                                          Exercises Other Exercises Other Exercises: standing lateral weight shifts after transfer training and gait training at EOB, improved weight acceptance with cont heavy reliance on BUE support, pt reporting increased WBing on RLE to about 75%, no buckling noted however therapist hand over right knee for blocking as needed   Shoulder Instructions       General Comments HR mid 90s post session    Pertinent Vitals/ Pain       Pain Assessment: 0-10 Pain Score: 3  Faces Pain Scale: Hurts a little bit Pain Location: R hip d/t spasms Pain Descriptors / Indicators: Aching;Spasm;Grimacing;Guarding Pain Intervention(s): Limited activity within patient's tolerance;Monitored during session;Premedicated before session;Repositioned;Ice applied  Home Living                                          Prior Functioning/Environment              Frequency  Min 2X/week        Progress Toward Goals  OT Goals(current goals can now be found in the care plan section)  Progress towards OT goals: Progressing toward goals  Acute Rehab OT Goals Patient Stated Goal: to be able to walk and go home  Plan Discharge plan remains appropriate    Co-evaluation    PT/OT/SLP Co-Evaluation/Treatment: Yes Reason for Co-Treatment: For patient/therapist safety;To address functional/ADL transfers PT goals addressed during session: Mobility/safety with mobility;Balance;Proper use of DME;Strengthening/ROM OT goals addressed during session: ADL's and self-care;Proper use of Adaptive equipment and DME      AM-PAC OT "6 Clicks" Daily Activity     Outcome Measure   Help from another person eating meals?: None Help from another person taking care of personal grooming?: A  Little Help from another person toileting, which includes using toliet, bedpan, or urinal?: A Lot Help from another person bathing (including washing, rinsing, drying)?: A Lot Help from another person to put on and taking off regular upper body clothing?: A Little Help from another person to put on and taking off regular lower body clothing?: A Lot 6 Click Score: 16    End of Session Equipment Utilized During Treatment: Gait belt;Rolling walker  OT Visit Diagnosis: Unsteadiness on feet (R26.81);Muscle weakness (generalized) (M62.81);History of falling (Z91.81);Pain Pain - Right/Left: Right Pain - part of body: Leg;Hip   Activity Tolerance Patient tolerated treatment well   Patient Left with call bell/phone within reach;in bed   Nurse Communication          Time: 7412-8786 OT Time Calculation (min): 43 min  Charges: OT General Charges $OT Visit: 1 Visit OT Treatments $Self Care/Home Management : 8-22 mins $Therapeutic Activity: 8-22 mins     Britt Bottom 03/09/2019, 2:05 PM

## 2019-03-09 NOTE — Progress Notes (Signed)
Physical Therapy Treatment Patient Details Name: Steven Blair MRN: [id number] DOB: [birth year] Today's Date: 03/09/2019    History of Present Illness 74 yo admitted after fall on black ice with right trochanter fx managed conservatively. Pt also with left hip pain due to OA. Pt with hematuria in ED with nephrolithiasis. PMHx: bil THA, Afib, sleep apnea, ulcerative colitis    PT Comments    Pt received up in recliner for PT/OT co treat this session. Pt encouraged by progress so far with therapy. Pt reporting he has done some of his LE therex this morning on his own. Pt is progressing well towards PT goals. Pt requiring mod A x2 for STS from recliner and improving to light min A on third stand from low bed. Pt required min A x2 for safety for stand pivot to bed from recliner. Pt working on standing weight shifts with RW and EOB with improved weight acceptance onto RLE. Pt also with improved ambulation distance with improved weight acceptance and gait pattern with chair follow for safety. Pt cont to have spasms during ambulation limiting ambulation tolerance. Pt extremely encouraged end of session on progression. Pt will cont to benefit from further acute PT to further progress mobility. Recommendation remains appropriate and suspect pt will do well.    Follow Up Recommendations  SNF     Equipment Recommendations  Other (comment)(TBD next venue)    Recommendations for Other Services       Precautions / Restrictions Precautions Precautions: Fall Precaution Comments: unpredictable RLE muscle spasms Restrictions Weight Bearing Restrictions: Yes RLE Weight Bearing: Weight bearing as tolerated    Mobility  Bed Mobility Overal bed mobility: Needs Assistance Bed Mobility: Sit to Supine       Sit to supine: Min assist;HOB elevated;+2 for physical assistance   General bed mobility comments: min A +2 to return to supine end of session, pt received sitting in recliner, assist for LE  advancement, increased time and effort  Transfers Overall transfer level: Needs assistance Equipment used: Rolling walker (2 wheeled) Transfers: Sit to/from [facility] Sit to Stand: Min assist;Mod assist;+2 physical assistance Stand pivot transfers: Min assist;+2 safety/equipment;+2 physical assistance       General transfer comment: x3 trials, x2 from recliner and x1 from bed in lower position, initially mod A x2 to rise from recliner with cuing required for hand placement and shifting hips forward to achieve full upright standing, second attempt with improved effort and carryover requiring decreased assist, third STS from bed in lower position from yesterday pt required min A x2 with mostly CGA from second person to achieve full standing, light min A x2 to complete stand pivot from recliner to bed after second trial STS  Ambulation/Gait Ambulation/Gait assistance: Min assist;+2 safety/equipment Gait Distance (Feet): 8 Feet Assistive device: Rolling walker (2 wheeled) Gait Pattern/deviations: Step-to pattern;Decreased weight shift to right;Shuffle;Decreased stance time - right Gait velocity: decreased   General Gait Details: improved overall tolerance from yesterday with increased distance, right knee blocked with therapist hand only needing tactile cuing and light assist to prevent knee buckling with improved knee control this session, heavy reliance on BUE supoprt from RW, slightly improved weight acceptance on RLE, pt continues to freq have muscle spasms esp during R LE swing phase, chair follow for safety   Stairs             Wheelchair Mobility    Modified Rankin (Stroke Patients Only)       Balance Overall balance assessment:  Needs assistance Sitting-balance support: Feet supported;No upper extremity supported Sitting balance-Leahy Scale: Fair Sitting balance - Comments: minguard EOB   Standing balance support: Bilateral upper extremity  supported Standing balance-Leahy Scale: Fair Standing balance comment: heavy reliance on RW, physical assist to block right knee                            Cognition Arousal/Alertness: Awake/alert Behavior During Therapy: WFL for tasks assessed/performed Overall Cognitive Status: Within Functional Limits for tasks assessed                                        Exercises Other Exercises Other Exercises: standing lateral weight shifts after transfer training and gait training at EOB, improved weight acceptance with cont heavy reliance on BUE support, pt reporting increased WBing on RLE to about 75%, no buckling noted however therapist hand over right knee for blocking as needed    General Comments General comments (skin integrity, edema, etc.): HR mid 90s post session      Pertinent Vitals/Pain Faces Pain Scale: Hurts a little bit Pain Location: R hip d/t spasms Pain Descriptors / Indicators: Aching;Spasm;Grimacing;Guarding Pain Intervention(s): Limited activity within patient's tolerance;Monitored during session;Premedicated before session;Repositioned;Ice applied    Home Living                      Prior Function            PT Goals (current goals can now be found in the care plan section) Progress towards PT goals: Progressing toward goals    Frequency    Min 5X/week      PT Plan Current plan remains appropriate    Co-evaluation PT/OT/SLP Co-Evaluation/Treatment: Yes Reason for Co-Treatment: For patient/therapist safety;To address functional/ADL transfers PT goals addressed during session: Mobility/safety with mobility;Balance;Proper use of DME;Strengthening/ROM OT goals addressed during session: ADL's and self-care      AM-PAC PT "6 Clicks" Mobility   Outcome Measure  Help needed turning from your back to your side while in a flat bed without using bedrails?: A Lot Help needed moving from lying on your back to sitting on  the side of a flat bed without using bedrails?: A Lot Help needed moving to and from a bed to a chair (including a wheelchair)?: A Lot Help needed standing up from a chair using your arms (e.g., wheelchair or bedside chair)?: A Lot Help needed to walk in hospital room?: Total Help needed climbing 3-5 steps with a railing? : Total 6 Click Score: 10    End of Session Equipment Utilized During Treatment: Gait belt Activity Tolerance: Patient tolerated treatment well Patient left: in bed;with call bell/phone within reach;with bed alarm set Nurse Communication: Mobility status PT Visit Diagnosis: Other abnormalities of gait and mobility (R26.89);History of falling (Z91.81);Muscle weakness (generalized) (M62.81);Pain Pain - Right/Left: Right Pain - part of body: Hip     Time: 1030-1102 PT Time Calculation (min) (ACUTE ONLY): 32 min  Charges:  $Therapeutic Activity: 8-22 mins                     Zachary George PT, DPT 12:49 PM,03/09/19    Aarushi Hemric Drucilla Chalet 03/09/2019, 12:45 PM

## 2019-03-09 NOTE — Progress Notes (Signed)
Patient is for SNF placement; Pending case # [id number]; Please fax clinicals to [phone]. [place] Supervisor [phone]

## 2019-03-09 NOTE — TOC Progression Note (Addendum)
Transition of Care [facility]) - Progression Note    Patient Details  Name: KAVION MANCINAS MRN: [id number] Date of Birth: Jul 08, 1945  Transition of Care [facility]) CM/SW [place], [place] Phone Number: 03/09/2019, 10:15 AM  Clinical Narrative:    11:39am- Spoke with Levada Dy at [employer], she had coverage yesterday while she was out, she has received clinicals, authorization approval still in process. Requested MD to order new COVID swab. CSW also has spoken with pt wife Louann to explain delays in authorization and questions about how pt will obtain second COVID vaccine scheduled for 25th.   10:15am- All information has been sent to [employer], continue to await insurance approval at this time. Pt COVID still valid, should we continue to await insurance approval for tomorrow will need new COVID, MD aware. TOC team continues to follow.    Expected Discharge Plan: IP Rehab Facility(vs SNF) Barriers to Discharge: Continued Medical Work up, Ship broker  Expected Discharge Plan and Services Expected Discharge Plan: IP Rehab Facility(vs SNF) In-house Referral: Clinical Social Work Discharge Planning Services: CM Consult Post Acute Care Choice: [place], [place] arrangements for the past 2 months: [place]  Readmission Risk Interventions Readmission Risk Prevention Plan 03/05/2019  Post Dischage Appt Not Complete  Appt Comments disposition pending  Medication Screening Complete  Transportation Screening Complete  Some recent data might be hidden

## 2019-03-09 NOTE — Social Work (Addendum)
4:45pm- Gay Filler, Utilization Review director provided with business office review contact at [employer], Jaquelyn Bitter. Pt wife aware and she and pt continue to await authorization.  4:01pm- Utilization review ref # and number/fax number provided by Olga Coaster, RN TOC lead have all been communicated to [facility] admissions.   3:50pm- Per Gay Filler, Utilization Review director: [facility] center notified [place] on 2/10 at 1300 after admission on 2/9 (Ref #[id number]). Provided UR team with [facility] admissions and billing department to further clarify.  3:31pm- Concern will also be relayed to Utilization Review RN.   3:27pm- CSW received call from [place] with [facility], at this time per Levada Dy they cannot complete auth for SNF due to [facility] not having received proper documentation from our admitting team at hospital. CSW has escalated this concern to Dr. Rockne Menghini as well as [facility] supervisor Barbette Or.   1:49pm- Additional clinical sent to [place], addressed delay with medical director Dr. Rockne Menghini.   Westley Hummer, MSW, [place] Work

## 2019-03-10 NOTE — Progress Notes (Signed)
PROGRESS NOTE  EADEN HETTINGER ZDG:[id number] DOB: [birth year] DOA: 03/01/2019 PCP: Josetta Huddle, MD  Brief History   74 year old man presented after a fall on black ice, admitted for acute right lesser trochanter fracture.  Seen by orthopedics with recommendation for conservative management.  Hospitalization complicated by atrial fibrillation with rapid ventricular response.  Seen by cardiology.  PT recommended SNF.  Awaiting insurance authorization.  A & P  Acute closed avulsion fracture lesser trochanteric right femur --Nonoperative management per orthopedics.  Continue pain control.  --Weight-bear as tolerated. --Follow-up with Dr. Alvan Dame sometime between March 22-March 26 --SNF recommended, awaiting authorization  Acute kidney injury resolved with gentle hydration  Hematuria likely secondary to nephrolithiasis.  Resolved.  Follow-up with Dr. Diona Fanti as an outpatient.  Atrial fibrillation with intermittent rapid ventricular response.  Chronic.  LVEF 50% November 2020.  Seen by cardiology, Cardizem increased to 240 mg daily.  Continue Eliquis. --Cardiology recommended reducing losartan to 50 mg daily and continuing hydrochlorothiazide 25 mg daily.  Cardiology will arrange outpatient follow-up.  Obstructive sleep apnea. --Continue CPAP at night  Obesity unspecified --Continue multivitamin with minerals daily, Ensure  Aortic atherosclerosis --Follow-up as an outpatient  DVT prophylaxis: apixaban Code Status: Full Family Communication: none Disposition Plan: SNF Patient from home.  Plan for SNF.  Discharge delayed by prolonged wait for insurance authorization.  No other barriers.   Murray Hodgkins, MD  [facility] Direct contact: see [email or web address] (further directions at bottom of note if needed) 7PM-7AM contact night coverage as at bottom of note 03/10/2019, 1:23 PM  LOS: 8 days   Significant Hospital Events   . 2/8 admitted for lesser trochanteric fracture   Consults:    . Orthopedics . Cardiology   Procedures:  .   Significant Diagnostic Tests:  . CT right hip: Minimally displaced fracture right lesser trochanter. . CT abdomen pelvis minimal cholelithiasis.  Nonobstructing right nephrolithiasis.  No hydronephrosis or renal obstruction noted.  No acute abnormality.  Aortic atherosclerosis. . Right hip: Chronic bilateral hip arthroplasties.  No acute fracture or dislocation identified right hip or pelvis.   Micro Data:  . SARS Covid negative   Antimicrobials:  .   Interval History/Subjective  Feels fine, no complaints.  Tolerating diet.  Objective   Vitals:  Vitals:   03/09/19 2117 03/10/19 0454  BP: 122/73 130/76  Pulse: 87 86  Resp: 18 19  Temp: 99.8 F (37.7 C) 97.7 F (36.5 C)  SpO2: 97% 98%    Exam:  Constitutional.  Appears calm, comfortable.  Sitting in chair eating lunch. Cardiovascular.  Regular rate and rhythm.  No murmur, rub or gallop. Respiratory.  Clear to auscultation bilaterally.  No wheezes, rales or rhonchi.  Normal respiratory effort. Psychiatric.  Grossly normal mood and affect.  Speech fluent and appropriate.  I have personally reviewed the following:   Today's Data  . No new data  Scheduled Meds: . acetaminophen  1,000 mg Oral TID  . apixaban  5 mg Oral BID  . diltiazem  240 mg Oral Daily  . docusate sodium  100 mg Oral BID  . feeding supplement (ENSURE ENLIVE)  237 mL Oral TID BM  . mesalamine  2.4 g Oral Daily  . multivitamin with minerals  1 tablet Oral Daily   Continuous Infusions: . methocarbamol (ROBAXIN) IV      Active Problems:   Sleep apnea   Hypertension   Left hip pain   Atrial fibrillation (HCC)   Closed avulsion fracture of lesser trochanter of  femur, right, initial encounter ([place])   Hematuria   Nephrolithiasis   Cholelithiasis   Avulsion fracture of condyle of femur ([place])   LOS: 8 days   How to contact the [facility] Attending or Consulting provider [place] or covering provider  during after hours [place], for this patient?  1. Check the care team in [facility] and look for a) attending/consulting TRH provider listed and b) the [facility] team listed 2. Log into [email or web address] and use [place]'s universal password to access. If you do not have the password, please contact the hospital operator. 3. Locate the [facility] provider you are looking for under [facility] and page to a number that you can be directly reached. 4. If you still have difficulty reaching the provider, please page the [facility] (Director on Call) for the Hospitalists listed on amion for assistance.

## 2019-03-10 NOTE — TOC Progression Note (Signed)
Transition of Care [facility]) - Progression Note    Patient Details  Name: Steven Blair MRN: [id number] Date of Birth: 1945/02/06  Transition of Care [facility]) CM/SW [place], [place] Phone Number: 03/10/2019, 10:16 AM  Clinical Narrative:     Utilization Review and [place] are continuing to work on processing SNF authorization. At this time CSW continues to await determination, unfortunately unable to expedite this process at this time. New COVID was negative.    Expected Discharge Plan: IP Rehab Facility(vs SNF) Barriers to Discharge: Continued Medical Work up, Ship broker  Expected Discharge Plan and Services Expected Discharge Plan: IP Rehab Facility(vs SNF) In-house Referral: Clinical Social Work Discharge Planning Services: CM Consult Post Acute Care Choice: [place], [place] arrangements for the past 2 months: [place]  Readmission Risk Interventions Readmission Risk Prevention Plan 03/05/2019  Post Dischage Appt Not Complete  Appt Comments disposition pending  Medication Screening Complete  Transportation Screening Complete  Some recent data might be hidden

## 2019-03-10 NOTE — Progress Notes (Signed)
Physical Therapy Treatment Patient Details Name: Steven Blair MRN: [id number] DOB: [birth year] Today's Date: 03/10/2019    History of Present Illness Pt is a 74 y/o male admitted after fall on black ice with right trochanter fx managed conservatively. Pt also with left hip pain due to OA. Pt with hematuria in ED with nephrolithiasis. PMHx: bil THA, Afib, sleep apnea, ulcerative colitis    PT Comments    Pt steadily progressing towards achieving his current functional mobility goals. He continues to require assistance with bed mobility and transfers. Unable to ambulate this session secondary to fatigue. Pt would continue to benefit from skilled physical therapy services at this time while admitted and after d/c to address the below listed limitations in order to improve overall safety and independence with functional mobility.    Follow Up Recommendations  SNF     Equipment Recommendations  Other (comment)(defer to next venue of care)    Recommendations for Other Services       Precautions / Restrictions Precautions Precautions: Fall Restrictions Weight Bearing Restrictions: Yes RLE Weight Bearing: Weight bearing as tolerated    Mobility  Bed Mobility Overal bed mobility: Needs Assistance Bed Mobility: Sit to Supine       Sit to supine: Mod assist   General bed mobility comments: pt able to manage trunk, assistance needed to return bilateral LEs onto bed  Transfers Overall transfer level: Needs assistance Equipment used: Rolling walker (2 wheeled) Transfers: Sit to/from Stand Sit to Stand: Min assist         General transfer comment: pt performed x1 from recliner chair with use of STEDY and min A; pt then performed x1 from EOB (bed in significantly elevated position) with RW and min A for stability  Ambulation/Gait                 Stairs             Wheelchair Mobility    Modified Rankin (Stroke Patients Only)       Balance Overall  balance assessment: Needs assistance Sitting-balance support: Feet supported;No upper extremity supported Sitting balance-Leahy Scale: Fair     Standing balance support: Bilateral upper extremity supported;During functional activity Standing balance-Leahy Scale: Poor                              Cognition Arousal/Alertness: Awake/alert Behavior During Therapy: WFL for tasks assessed/performed Overall Cognitive Status: Within Functional Limits for tasks assessed                                        Exercises General Exercises - Lower Extremity Ankle Circles/Pumps: AROM;Both;20 reps;Seated Quad Sets: AROM;Both;10 reps;Seated Gluteal Sets: AROM;Both;10 reps;Seated Long Arc Quad: AROM;Both;10 reps;Seated Heel Slides: AROM;Both;10 reps;Seated Hip ABduction/ADduction: AAROM;Right;10 reps;Seated Hip Flexion/Marching: AROM;Both;10 reps;Seated    General Comments        Pertinent Vitals/Pain Pain Assessment: 0-10 Pain Score: 4  Pain Location: R hip Pain Descriptors / Indicators: Aching;Spasm;Grimacing;Guarding Pain Intervention(s): Monitored during session;Repositioned    Home Living                      Prior Function            PT Goals (current goals can now be found in the care plan section) Acute Rehab PT Goals PT Goal Formulation: With patient/family  Time For Goal Achievement: 03/16/19 Potential to Achieve Goals: Fair Progress towards PT goals: Progressing toward goals    Frequency    Min 5X/week      PT Plan Current plan remains appropriate    Co-evaluation              AM-PAC PT "6 Clicks" Mobility   Outcome Measure  Help needed turning from your back to your side while in a flat bed without using bedrails?: A Lot Help needed moving from lying on your back to sitting on the side of a flat bed without using bedrails?: A Lot Help needed moving to and from a bed to a chair (including a wheelchair)?: A  Lot Help needed standing up from a chair using your arms (e.g., wheelchair or bedside chair)?: A Lot Help needed to walk in hospital room?: A Lot Help needed climbing 3-5 steps with a railing? : Total 6 Click Score: 11    End of Session Equipment Utilized During Treatment: Gait belt Activity Tolerance: Patient tolerated treatment well Patient left: in bed;with call bell/phone within reach;with bed alarm set Nurse Communication: Mobility status PT Visit Diagnosis: Other abnormalities of gait and mobility (R26.89);History of falling (Z91.81);Muscle weakness (generalized) (M62.81);Pain Pain - Right/Left: Right Pain - part of body: Hip     Time: 7225-7505 PT Time Calculation (min) (ACUTE ONLY): 34 min  Charges:  $Therapeutic Exercise: 8-22 mins $Therapeutic Activity: 8-22 mins                     Anastasio Champion, DPT  Acute Rehabilitation Services Pager [phone] Office [place] 03/10/2019, 4:37 PM

## 2019-03-10 NOTE — Care Management (Signed)
UR Case Manager provided  a number for expedited discharge needs- [phone] Steven Blair at [facility]. Called spoke to [place]. [facility] is off but she will have someone call NCM back.   Magdalen Spatz RN

## 2019-03-11 MED ORDER — OXYCODONE HCL 10 MG PO TABS: 10.0000 mg | ORAL_TABLET | Freq: Two times a day (BID) | ORAL | 0 refills | Status: AC | PRN

## 2019-03-11 MED ORDER — ENSURE ENLIVE PO LIQD: 237.0000 mL | Freq: Three times a day (TID) | ORAL | 0 refills | Status: AC

## 2019-03-11 MED ORDER — METHOCARBAMOL 500 MG PO TABS: 500.0000 mg | ORAL_TABLET | Freq: Four times a day (QID) | ORAL | 0 refills | Status: AC | PRN

## 2019-03-11 MED ORDER — DOCUSATE SODIUM 100 MG PO CAPS: 100.0000 mg | ORAL_CAPSULE | Freq: Two times a day (BID) | ORAL | 0 refills | Status: DC

## 2019-03-11 MED ORDER — DILTIAZEM HCL ER COATED BEADS 240 MG PO CP24: 240.0000 mg | ORAL_CAPSULE | Freq: Every day | ORAL | 0 refills | Status: DC

## 2019-03-11 MED ORDER — ADULT MULTIVITAMIN W/MINERALS CH: 1.0000 | ORAL_TABLET | Freq: Every day | ORAL | 0 refills | Status: AC

## 2019-03-11 NOTE — TOC Progression Note (Signed)
Transition of Care [facility]) - Progression Note    Patient Details  Name: Steven Blair MRN: [id number] Date of Birth: 1945/11/01  Transition of Care [facility]) CM/SW [place], [place] Phone Number: 03/11/2019, 8:38 AM  Clinical Narrative:    Authorization has been received, pt wife [name] was contacted and notified. [facility] is ready for pt, MD aware and working on discharge information.   Expected Discharge Plan: IP Rehab Facility(vs SNF) Barriers to Discharge: Continued Medical Work up, Ship broker  Expected Discharge Plan and Services Expected Discharge Plan: IP Rehab Facility(vs SNF) In-house Referral: Clinical Social Work Discharge Planning Services: CM Consult Post Acute Care Choice: [place], [place] Living arrangements for the past 2 months: [place] Expected Discharge Date: 03/11/19                Readmission Risk Interventions Readmission Risk Prevention Plan 03/05/2019  Post Dischage Appt Not Complete  Appt Comments disposition pending  Medication Screening Complete  Transportation Screening Complete  Some recent data might be hidden

## 2019-03-11 NOTE — Discharge Instructions (Signed)
Fall Prevention in the Home, Adult Falls can cause injuries and can affect people from all age groups. There are many simple things that you can do to make your home safe and to help prevent falls. Ask for help when making these changes, if needed. What actions can I take to prevent falls? General instructions  Use good lighting in all rooms. Replace any light bulbs that burn out.  Turn on lights if it is dark. Use night-lights.  Place frequently used items in easy-to-reach places. Lower the shelves around your home if necessary.  Set up furniture so that there are clear paths around it. Avoid moving your furniture around.  Remove throw rugs and other tripping hazards from the floor.  Avoid walking on wet floors.  Fix any uneven floor surfaces.  Add color or contrast paint or tape to grab bars and handrails in your home. Place contrasting color strips on the first and last steps of stairways.  When you use a stepladder, make sure that it is completely opened and that the sides are firmly locked. Have someone hold the ladder while you are using it. Do not climb a closed stepladder.  Be aware of any and all pets. What can I do in the bathroom?      Keep the floor dry. Immediately clean up any water that spills onto the floor.  Remove soap buildup in the tub or shower on a regular basis.  Use non-skid mats or decals on the floor of the tub or shower.  Attach bath mats securely with double-sided, non-slip rug tape.  If you need to sit down while you are in the shower, use a plastic, non-slip stool.  Install grab bars by the toilet and in the tub and shower. Do not use towel bars as grab bars. What can I do in the bedroom?  Make sure that a bedside light is easy to reach.  Do not use oversized bedding that drapes onto the floor.  Have a firm chair that has side arms to use for getting dressed. What can I do in the kitchen?  Clean up any spills right away.  If you need to  reach for something above you, use a sturdy step stool that has a grab bar.  Keep electrical cables out of the way.  Do not use floor polish or wax that makes floors slippery. If you must use wax, make sure that it is non-skid floor wax. What can I do in the stairways?  Do not leave any items on the stairs.  Make sure that you have a light switch at the top of the stairs and the bottom of the stairs. Have them installed if you do not have them.  Make sure that there are handrails on both sides of the stairs. Fix handrails that are broken or loose. Make sure that handrails are as long as the stairways.  Install non-slip stair treads on all stairs in your home.  Avoid having throw rugs at the top or bottom of stairways, or secure the rugs with carpet tape to prevent them from moving.  Choose a carpet design that does not hide the edge of steps on the stairway.  Check any carpeting to make sure that it is firmly attached to the stairs. Fix any carpet that is loose or worn. What can I do on the outside of my home?  Use bright outdoor lighting.  Regularly repair the edges of walkways and driveways and fix any cracks.  Remove high doorway thresholds.  Trim any shrubbery on the main path into your home.  Regularly check that handrails are securely fastened and in good repair. Both sides of any steps should have handrails.  Install guardrails along the edges of any raised decks or porches.  Clear walkways of debris and clutter, including tools and rocks.  Have leaves, snow, and ice cleared regularly.  Use sand or salt on walkways during winter months.  In the garage, clean up any spills right away, including grease or oil spills. What other actions can I take?  Wear closed-toe shoes that fit well and support your feet. Wear shoes that have rubber soles or low heels.  Use mobility aids as needed, such as canes, walkers, scooters, and crutches.  Review your medicines with your  health care provider. Some medicines can cause dizziness or changes in blood pressure, which increase your risk of falling. Talk with your health care provider about other ways that you can decrease your risk of falls. This may include working with a physical therapist or trainer to improve your strength, balance, and endurance. Where to find more information  Centers for Disease Control and Prevention, STEADI: WebmailGuide.co.za  [employer] on Aging: BrainJudge.co.uk Contact a health care provider if:  You are afraid of falling at home.  You feel weak, drowsy, or dizzy at home.  You fall at home. Summary  There are many simple things that you can do to make your home safe and to help prevent falls.  Ways to make your home safe include removing tripping hazards and installing grab bars in the bathroom.  Ask for help when making these changes in your home. This information is not intended to replace advice given to you by your health care provider. Make sure you discuss any questions you have with your health care provider. Document Revised: 12/20/2016 Document Reviewed: 08/22/2016 Elsevier Patient Education  Steven Blair Prevention in the Home, Adult Falls can cause injuries. They can happen to people of all ages. There are many things you can do to make your home safe and to help prevent falls. Ask for help when making these changes, if needed. What actions can I take to prevent falls? General Instructions  Use good lighting in all rooms. Replace any light bulbs that burn out.  Turn on the lights when you go into a dark area. Use night-lights.  Keep items that you use often in easy-to-reach places. Lower the shelves around your home if necessary.  Set up your furniture so you have a clear path. Avoid moving your furniture around.  Do not have throw rugs and other things on the floor that can make you trip.  Avoid walking on wet floors.  If  any of your floors are uneven, fix them.  Add color or contrast paint or tape to clearly mark and help you see: ? Any grab bars or handrails. ? First and last steps of stairways. ? Where the edge of each step is.  If you use a stepladder: ? Make sure that it is fully opened. Do not climb a closed stepladder. ? Make sure that both sides of the stepladder are locked into place. ? Ask someone to hold the stepladder for you while you use it.  If there are any pets around you, be aware of where they are. What can I do in the bathroom?      Keep the floor dry. Clean up any water that spills  onto the floor as soon as it happens.  Remove soap buildup in the tub or shower regularly.  Use non-skid mats or decals on the floor of the tub or shower.  Attach bath mats securely with double-sided, non-slip rug tape.  If you need to sit down in the shower, use a plastic, non-slip stool.  Install grab bars by the toilet and in the tub and shower. Do not use towel bars as grab bars. What can I do in the bedroom?  Make sure that you have a light by your bed that is easy to reach.  Do not use any sheets or blankets that are too big for your bed. They should not hang down onto the floor.  Have a firm chair that has side arms. You can use this for support while you get dressed. What can I do in the kitchen?  Clean up any spills right away.  If you need to reach something above you, use a strong step stool that has a grab bar.  Keep electrical cords out of the way.  Do not use floor polish or wax that makes floors slippery. If you must use wax, use non-skid floor wax. What can I do with my stairs?  Do not leave any items on the stairs.  Make sure that you have a light switch at the top of the stairs and the bottom of the stairs. If you do not have them, ask someone to add them for you.  Make sure that there are handrails on both sides of the stairs, and use them. Fix handrails that are  broken or loose. Make sure that handrails are as long as the stairways.  Install non-slip stair treads on all stairs in your home.  Avoid having throw rugs at the top or bottom of the stairs. If you do have throw rugs, attach them to the floor with carpet tape.  Choose a carpet that does not hide the edge of the steps on the stairway.  Check any carpeting to make sure that it is firmly attached to the stairs. Fix any carpet that is loose or worn. What can I do on the outside of my home?  Use bright outdoor lighting.  Regularly fix the edges of walkways and driveways and fix any cracks.  Remove anything that might make you trip as you walk through a door, such as a raised step or threshold.  Trim any bushes or trees on the path to your home.  Regularly check to see if handrails are loose or broken. Make sure that both sides of any steps have handrails.  Install guardrails along the edges of any raised decks and porches.  Clear walking paths of anything that might make someone trip, such as tools or rocks.  Have any leaves, snow, or ice cleared regularly.  Use sand or salt on walking paths during winter.  Clean up any spills in your garage right away. This includes grease or oil spills. What other actions can I take?  Wear shoes that: ? Have a low heel. Do not wear high heels. ? Have rubber bottoms. ? Are comfortable and fit you well. ? Are closed at the toe. Do not wear open-toe sandals.  Use tools that help you move around (mobility aids) if they are needed. These include: ? Canes. ? Walkers. ? Scooters. ? Crutches.  Review your medicines with your doctor. Some medicines can make you feel dizzy. This can increase your chance of falling. Ask your  doctor what other things you can do to help prevent falls. Where to find more information  Centers for Disease Control and Prevention, STEADI: https://garcia.biz/  [employer] on Aging: BrainJudge.co.uk Contact  a doctor if:  You are afraid of falling at home.  You feel weak, drowsy, or dizzy at home.  You fall at home. Summary  There are many simple things that you can do to make your home safe and to help prevent falls.  Ways to make your home safe include removing tripping hazards and installing grab bars in the bathroom.  Ask for help when making these changes in your home. This information is not intended to replace advice given to you by your health care provider. Make sure you discuss any questions you have with your health care provider. Document Revised: 04/30/2018 Document Reviewed: 08/22/2016 Elsevier Patient Education  Steven Blair.  Atrial Fibrillation  Atrial fibrillation is a type of heartbeat that is irregular or fast. If you have this condition, your heart beats without any order. This makes it hard for your heart to pump blood in a normal way. Atrial fibrillation may come and go, or it may become a long-lasting problem. If this condition is not treated, it can put you at higher risk for stroke, heart failure, and other heart problems. What are the causes? This condition may be caused by diseases that damage the heart. They include:  High blood pressure.  Heart failure.  Heart valve disease.  Heart surgery. Other causes include:  Diabetes.  Thyroid disease.  Being overweight.  Kidney disease. Sometimes the cause is not known. What increases the risk? You are more likely to develop this condition if:  You are older.  You smoke.  You exercise often and very hard.  You have a family history of this condition.  You are a man.  You use drugs.  You drink a lot of alcohol.  You have lung conditions, such as emphysema, pneumonia, or COPD.  You have sleep apnea. What are the signs or symptoms? Common symptoms of this condition include:  A feeling that your heart is beating very fast.  Chest pain or discomfort.  Feeling short of  breath.  Suddenly feeling light-headed or weak.  Getting tired easily during activity.  Fainting.  Sweating. In some cases, there are no symptoms. How is this treated? Treatment for this condition depends on underlying conditions and how you feel when you have atrial fibrillation. They include:  Medicines to: ? Prevent blood clots. ? Treat heart rate or heart rhythm problems.  Using devices, such as a pacemaker, to correct heart rhythm problems.  Doing surgery to remove the part of the heart that sends bad signals.  Closing an area where clots can form in the heart (left atrial appendage). In some cases, your doctor will treat other underlying conditions. Follow these instructions at home: Medicines  Take over-the-counter and prescription medicines only as told by your doctor.  Do not take any new medicines without first talking to your doctor.  If you are taking blood thinners: ? Talk with your doctor before you take any medicines that have aspirin or NSAIDs, such as ibuprofen, in them. ? Take your medicine exactly as told by your doctor. Take it at the same time each day. ? Avoid activities that could hurt or bruise you. Follow instructions about how to prevent falls. ? Wear a bracelet that says you are taking blood thinners. Or, carry a card that lists what medicines you  take. Lifestyle      Do not use any products that have nicotine or tobacco in them. These include cigarettes, e-cigarettes, and chewing tobacco. If you need help quitting, ask your doctor.  Eat heart-healthy foods. Talk with your doctor about the right eating plan for you.  Exercise regularly as told by your doctor.  Do not drink alcohol.  Lose weight if you are overweight.  Do not use drugs, including cannabis. General instructions  If you have a condition that causes breathing to stop for a short period of time (apnea), treat it as told by your doctor.  Keep a healthy weight. Do not use  diet pills unless your doctor says they are safe for you. Diet pills may make heart problems worse.  Keep all follow-up visits as told by your doctor. This is important. Contact a doctor if:  You notice a change in the speed, rhythm, or strength of your heartbeat.  You are taking a blood-thinning medicine and you get more bruising.  You get tired more easily when you move or exercise.  You have a sudden change in weight. Get help right away if:   You have pain in your chest or your belly (abdomen).  You have trouble breathing.  You have side effects of blood thinners, such as blood in your vomit, poop (stool), or pee (urine), or bleeding that cannot stop.  You have any signs of a stroke. "BE FAST" is an easy way to remember the main warning signs: ? B - Balance. Signs are dizziness, sudden trouble walking, or loss of balance. ? E - Eyes. Signs are trouble seeing or a change in how you see. ? F - Face. Signs are sudden weakness or loss of feeling in the face, or the face or eyelid drooping on one side. ? A - Arms. Signs are weakness or loss of feeling in an arm. This happens suddenly and usually on one side of the body. ? S - Speech. Signs are sudden trouble speaking, slurred speech, or trouble understanding what people say. ? T - Time. Time to call emergency services. Write down what time symptoms started.  You have other signs of a stroke, such as: ? A sudden, very bad headache with no known cause. ? Feeling like you may vomit (nausea). ? Vomiting. ? A seizure. These symptoms may be an emergency. Do not wait to see if the symptoms will go away. Get medical help right away. Call your local emergency services (911 in the [place] Do not drive yourself to the hospital. Summary  Atrial fibrillation is a type of heartbeat that is irregular or fast.  You are at higher risk of this condition if you smoke, are older, have diabetes, or are overweight.  Follow your doctor's instructions  about medicines, diet, exercise, and follow-up visits.  Get help right away if you have signs or symptoms of a stroke.  Get help right away if you cannot catch your breath, or you have chest pain or discomfort. This information is not intended to replace advice given to you by your health care provider. Make sure you discuss any questions you have with your health care provider. Document Revised: 07/01/2018 Document Reviewed: 07/01/2018 Elsevier Patient Education  Steven Blair.

## 2019-03-11 NOTE — Discharge Summary (Signed)
Discharge Summary  Steven Blair FFM:[id number] DOB: [birth year]  PCP: Josetta Huddle, MD  Admit date: 03/01/2019 Discharge date: 03/11/2019  Time spent: 35 minutes  Recommendations for Outpatient Follow-up:  1. Follow-up with orthopedic surgery. 2. Follow-up with your primary care provider in 1 to 2 weeks. 3. Follow-up with cardiology in 1 to 2 weeks. 4. Follow-up with Urology. 5. Take your medications as described 6. Continue PT OT with assistance and fall precautions.  Discharge Diagnoses:  Active Hospital Problems   Diagnosis Date Noted  . Avulsion fracture of condyle of femur ([place]) 03/02/2019  . Closed avulsion fracture of lesser trochanter of femur, right, initial encounter ([place]) 03/01/2019  . Hematuria 03/01/2019  . Nephrolithiasis 03/01/2019  . Cholelithiasis 03/01/2019  . Atrial fibrillation ([place]) 10/14/2018  . Left hip pain 10/19/2017  . Sleep apnea   . Hypertension     Resolved Hospital Problems  No resolved problems to display.    Discharge Condition: Stable  Diet recommendation: Resume previous diet  Vitals:   03/10/19 2132 03/11/19 0635  BP: 114/73 129/78  Pulse: 79 85  Resp: 18 18  Temp: 98.6 F (37 C) 98.7 F (37.1 C)  SpO2: 97% 96%    History of present illness:  Brief History   74 year old man presented after a fall on black ice, admitted for acute right lesser trochanter fracture.  Seen by orthopedics with recommendation for conservative management.  Hospitalization complicated by atrial fibrillation with rapid ventricular response.  Seen by cardiology.  PT recommended SNF.  Awaiting insurance authorization.  A & P  Acute closed avulsion fracture lesser trochanteric right femur --Nonoperative management per orthopedics.  Continue pain control.  --Weight-bear as tolerated. --Follow-up with Dr. Alvan Dame sometime between March 22-March 26 --SNF recommended --Continue PT OT with assistance and fall precautions.  Acute kidney injury resolved with  gentle hydration  Hematuria likely secondary to nephrolithiasis.  Resolved.  Follow-up with Dr. Diona Fanti as an outpatient.  Atrial fibrillation with intermittent rapid ventricular response.  Chronic.  LVEF 50% November 2020.  Seen by cardiology, Cardizem increased to 240 mg daily.  Continue Eliquis. --Cardiology recommended reducing losartan to 50 mg daily and continuing hydrochlorothiazide 25 mg daily.  Cardiology will arrange outpatient follow-up.>>>  Blood pressure has been soft on 240 mg of p.o. diltiazem.  Will hold off losartan and HCTZ for now to avoid hypotension and AKI.  Recommend close follow-up with PCP and cardiology.  Defer to PCP or cardiology to resume if BP is appropriate.  Obstructive sleep apnea. --Continue CPAP at night  Obesity unspecified --Continue multivitamin with minerals daily, Ensure  Aortic atherosclerosis --Follow-up as an outpatient  DVT prophylaxis: apixaban Code Status: Full Family Communication: none Disposition Plan: SNF   03/11/2019, 09:49 AM  LOS: 9 days   Significant Hospital Events    2/8 admitted for lesser trochanteric fracture  Consults:   Orthopedics  Cardiology  Procedures:     Significant Diagnostic Tests:   CT right hip: Minimally displaced fracture right lesser trochanter.  CT abdomen pelvis minimal cholelithiasis.  Nonobstructing right nephrolithiasis.  No hydronephrosis or renal obstruction noted.  No acute abnormality.  Aortic atherosclerosis.  Right hip: Chronic bilateral hip arthroplasties.  No acute fracture or dislocation identified right hip or pelvis.  Micro Data:   SARS Covid negative  Antimicrobials:     Interval History/Subjective  Feels fine, no complaints.  Tolerating diet.    Hospital Course:  Active Problems:   Sleep apnea   Hypertension   Left hip pain  Atrial fibrillation ([place])   Closed avulsion fracture of lesser trochanter of femur, right, initial encounter ([place])    Hematuria   Nephrolithiasis   Cholelithiasis   Avulsion fracture of condyle of femur ([place])   Discharge Exam: BP 129/78 (BP Location: Right Arm)   Pulse 85   Temp 98.7 F (37.1 C) (Oral)   Resp 18   Ht 6' (1.829 m)   Wt 118.7 kg   SpO2 96%   BMI 35.49 kg/m  . General: 74 y.o. year-old male well developed well nourished in no acute distress.  Alert and oriented x3. . Cardiovascular: Regular rate and rhythm with no rubs or gallops.  No thyromegaly or JVD noted.   Marland Kitchen Respiratory: Clear to auscultation with no wheezes or rales. Good inspiratory effort. . Abdomen: Soft nontender nondistended with normal bowel sounds x4 quadrants. . Musculoskeletal: Trace lower extremity edema bilaterally.  Marland Kitchen Psychiatry: Mood is appropriate for condition and setting  Discharge Instructions You were cared for by a hospitalist during your hospital stay. If you have any questions about your discharge medications or the care you received while you were in the hospital after you are discharged, you can call the unit and asked to speak with the hospitalist on call if the hospitalist that took care of you is not available. Once you are discharged, your primary care physician will handle any further medical issues. Please note that NO REFILLS for any discharge medications will be authorized once you are discharged, as it is imperative that you return to your primary care physician (or establish a relationship with a primary care physician if you do not have one) for your aftercare needs so that they can reassess your need for medications and monitor your lab values.   Allergies as of 03/11/2019      Reactions   Sulfa Antibiotics Other (See Comments)   Childhood allergy      Medication List    STOP taking these medications   losartan-hydrochlorothiazide 100-25 MG tablet Commonly known as: HYZAAR     TAKE these medications   acetaminophen 500 MG tablet Commonly known as: TYLENOL Take 1,500 mg by mouth every  8 (eight) hours as needed for mild pain or moderate pain.   diltiazem 240 MG 24 hr capsule Commonly known as: CARDIZEM CD Take 1 capsule (240 mg total) by mouth daily. What changed:   medication strength  how much to take   docusate sodium 100 MG capsule Commonly known as: COLACE Take 1 capsule (100 mg total) by mouth 2 (two) times daily.   Eliquis 5 MG Tabs tablet Generic drug: apixaban TAKE 1 TABLET BY MOUTH 2 TIMES DAILY. What changed: how much to take   feeding supplement (ENSURE ENLIVE) Liqd Take 237 mLs by mouth 3 (three) times daily between meals for 7 days.   mesalamine 1.2 g EC tablet Commonly known as: LIALDA Take 2.4 g by mouth daily.   methocarbamol 500 MG tablet Commonly known as: ROBAXIN Take 1 tablet (500 mg total) by mouth every 6 (six) hours as needed for up to 5 days for muscle spasms.   multivitamin with minerals Tabs tablet Take 1 tablet by mouth daily.   Oxycodone HCl 10 MG Tabs Take 1 tablet (10 mg total) by mouth 2 (two) times daily as needed for up to 3 days for moderate pain.   Vitamin D3 50 MCG (2000 UT) capsule Take 2,000 Units by mouth daily.  Durable Medical Equipment  (From admission, onward)         Start     Ordered   03/03/19 0850  For home use only DME Hospital bed  Once    Question Answer Comment  Length of Need 6 Months   Patient has (list medical condition): Closed avulsion fracture   The above medical condition requires: Patient requires the ability to reposition frequently   Head must be elevated greater than: 45 degrees   Bed type Semi-electric   Support Surface: Gel Overlay      03/03/19 0850         Allergies  Allergen Reactions  . Sulfa Antibiotics Other (See Comments)    Childhood allergy    Contact information for follow-up providers    Fenton, Clint R, PA. Go on 03/23/2019.   Specialty: Cardiology Why: @1 :30pm for hospital follow up  Contact information: [place]  [id number] [phone]        Josetta Huddle, MD. Call in 1 day(s).   Specialty: Internal Medicine Why: Please call for a post hospital follow-up appointment. Contact information: [address] [place] [address] [phone]        Paralee Cancel, MD. Call in 1 day(s).   Specialty: Orthopedic Surgery Why: Please call for a post hospital follow-up appointment. Contact information: [address] [place] [address] [phone]        Franchot Gallo, MD. Call in 1 day(s).   Specialty: Urology Why: Please call for a post hospital follow-up appointment. Contact information: [address] [place] [address] [phone]            Contact information for after-discharge care    Destination    [facility] Preferred SNF .   Service: Skilled Nursing Contact information: [place] [phone]                   The results of significant diagnostics from this hospitalization (including imaging, microbiology, ancillary and laboratory) are listed below for reference.    Significant Diagnostic Studies: CT ABDOMEN PELVIS W CONTRAST  Result Date: 03/01/2019 CLINICAL DATA:  Abdominal pain after fall today. EXAM: CT ABDOMEN AND PELVIS WITH CONTRAST TECHNIQUE: Multidetector CT imaging of the abdomen and pelvis was performed using the standard protocol following bolus administration of intravenous contrast. CONTRAST:  147m OMNIPAQUE IOHEXOL 300 MG/ML  SOLN COMPARISON:  October 31, 2014. FINDINGS: Lower chest: No acute abnormality. Hepatobiliary: Minimal cholelithiasis is noted. No biliary dilatation is noted. The liver is unremarkable. Pancreas: Unremarkable. No pancreatic ductal dilatation or surrounding inflammatory changes. Spleen: Normal in size without focal abnormality. Adrenals/Urinary Tract: Adrenal glands appear normal. Left renal cyst is noted. Nonobstructive  right nephrolithiasis is noted. Cortical scarring is noted in the right lower pole. No hydronephrosis or renal obstruction is noted. No ureteral calculi are noted. Urinary bladder is unremarkable. Stomach/Bowel: Stomach is within normal limits. Appendix appears normal. No evidence of bowel wall thickening, distention, or inflammatory changes. Vascular/Lymphatic: Aortic atherosclerosis. No enlarged abdominal or pelvic lymph nodes. Reproductive: Prostate is unremarkable. Other: No abdominal wall hernia or abnormality. No abdominopelvic ascites. Musculoskeletal: Status post bilateral hip arthroplasty. No acute osseous abnormality is noted. IMPRESSION: 1. Minimal cholelithiasis. 2. Nonobstructive right nephrolithiasis. No hydronephrosis or renal obstruction is noted. 3. No acute abnormality seen in the abdomen or pelvis. Aortic Atherosclerosis (ICD10-I70.0). Electronically Signed   By: JBobbe MedicoD.  On: 03/01/2019 14:22   CT Hip Right Wo Contrast  Result Date: 03/01/2019 CLINICAL DATA:  Right hip pain after fall on ice this morning. EXAM: CT OF THE RIGHT HIP WITHOUT CONTRAST TECHNIQUE: Multidetector CT imaging of the right hip was performed according to the standard protocol. Multiplanar CT image reconstructions were also generated. COMPARISON:  Right hip x-rays from same day. CT abdomen pelvis dated October 31, 2014. FINDINGS: Bones/Joint/Cartilage Acute minimally displaced fracture of the right lesser trochanter, best appreciated on the coronal images. No additional fracture. No dislocation. Prior right total hip arthroplasty without hardware complication. Partially visualized left hip arthroplasty. No joint effusion. Ligaments Ligaments are suboptimally evaluated by CT. Muscles and Tendons Grossly intact. Mild gluteal muscle atrophy. Soft tissue No fluid collection or hematoma. No soft tissue mass. Mild prostatomegaly. Aortoiliac atherosclerotic vascular disease. IMPRESSION: 1. Acute minimally displaced  fracture of the right lesser trochanter. 2. Right total hip arthroplasty without hardware complication. Electronically Signed   By: Titus Dubin M.D.   On: 03/01/2019 09:40   DG Hip Unilat W or Wo Pelvis 2-3 Views Right  Result Date: 03/01/2019 CLINICAL DATA:  74 year old male status post fall this morning in driveway. EXAM: DG HIP (WITH OR WITHOUT PELVIS) 2-3V RIGHT COMPARISON:  Left hip CT 10/18/2017. Left hip series 10/18/2017. FINDINGS: Chronic bilateral hip arthroplasty. Alignment of the right hip arthroplasty component appears stable, with no surrounding right hip region acute fracture identified. Visible left total hip arthroplasty also appears stable. No acute pelvis fracture identified. Negative visible bowel gas pattern. Pelvic phleboliths. IMPRESSION: Chronic bilateral hip arthroplasties. No acute fracture or dislocation identified about the right hip or pelvis. Electronically Signed   By: Genevie Ann M.D.   On: 03/01/2019 08:30    Microbiology: Recent Results (from the past 240 hour(s))  Respiratory Panel by RT PCR (Flu A&B, Covid) - Nasopharyngeal Swab     Status: None   Collection Time: 03/01/19  4:43 PM   Specimen: Nasopharyngeal Swab  Result Value Ref Range Status   SARS Coronavirus 2 by RT PCR NEGATIVE NEGATIVE Final    Comment: (NOTE) SARS-CoV-2 target nucleic acids are NOT DETECTED. The SARS-CoV-2 RNA is generally detectable in upper respiratoy specimens during the acute phase of infection. The lowest concentration of SARS-CoV-2 viral copies this assay can detect is 131 copies/mL. A negative result does not preclude SARS-Cov-2 infection and should not be used as the sole basis for treatment or other patient management decisions. A negative result may occur with  improper specimen collection/handling, submission of specimen other than nasopharyngeal swab, presence of viral mutation(s) within the areas targeted by this assay, and inadequate number of viral copies (<131  copies/mL). A negative result must be combined with clinical observations, patient history, and epidemiological information. The expected result is Negative. Fact Sheet for Patients:  PinkCheek.be Fact Sheet for Healthcare Providers:  GravelBags.it This test is not yet ap proved or cleared by the [place] FDA and  has been authorized for detection and/or diagnosis of SARS-CoV-2 by FDA under an Emergency Use Authorization (EUA). This EUA will remain  in effect (meaning this test can be used) for the duration of the COVID-19 declaration under Section 564(b)(1) of the Act, 21 U.S.C. section 360bbb-3(b)(1), unless the authorization is terminated or revoked sooner.    Influenza A by PCR NEGATIVE NEGATIVE Final   Influenza B by PCR NEGATIVE NEGATIVE Final    Comment: (NOTE) The Xpert Xpress SARS-CoV-2/FLU/RSV assay is intended as an aid in  the diagnosis  of influenza from Nasopharyngeal swab specimens and  should not be used as a sole basis for treatment. Nasal washings and  aspirates are unacceptable for Xpert Xpress SARS-CoV-2/FLU/RSV  testing. Fact Sheet for Patients: PinkCheek.be Fact Sheet for Healthcare Providers: GravelBags.it This test is not yet approved or cleared by the [place] FDA and  has been authorized for detection and/or diagnosis of SARS-CoV-2 by  FDA under an Emergency Use Authorization (EUA). This EUA will remain  in effect (meaning this test can be used) for the duration of the  Covid-19 declaration under Section 564(b)(1) of the Act, 21  U.S.C. section 360bbb-3(b)(1), unless the authorization is  terminated or revoked. Performed at [facility], [place] [address] [place], [place] [address]   SARS CORONAVIRUS 2 (TAT 6-24 HRS) Nasopharyngeal Nasopharyngeal Swab     Status: None   Collection Time: 03/07/19  6:16 PM   Specimen:  Nasopharyngeal Swab  Result Value Ref Range Status   SARS Coronavirus 2 NEGATIVE NEGATIVE Final    Comment: (NOTE) SARS-CoV-2 target nucleic acids are NOT DETECTED. The SARS-CoV-2 RNA is generally detectable in upper and lower respiratory specimens during the acute phase of infection. Negative results do not preclude SARS-CoV-2 infection, do not rule out co-infections with other pathogens, and should not be used as the sole basis for treatment or other patient management decisions. Negative results must be combined with clinical observations, patient history, and epidemiological information. The expected result is Negative. Fact Sheet for Patients: SugarRoll.be Fact Sheet for Healthcare Providers: https://www.woods-mathews.com/ This test is not yet approved or cleared by the [place] FDA and  has been authorized for detection and/or diagnosis of SARS-CoV-2 by FDA under an Emergency Use Authorization (EUA). This EUA will remain  in effect (meaning this test can be used) for the duration of the COVID-19 declaration under Section 56 4(b)(1) of the Act, 21 U.S.C. section 360bbb-3(b)(1), unless the authorization is terminated or revoked sooner. Performed at [facility], [place] [address], [place], [place] [address]   SARS CORONAVIRUS 2 (TAT 6-24 HRS) Nasopharyngeal Nasopharyngeal Swab     Status: None   Collection Time: 03/09/19 11:41 AM   Specimen: Nasopharyngeal Swab  Result Value Ref Range Status   SARS Coronavirus 2 NEGATIVE NEGATIVE Final    Comment: (NOTE) SARS-CoV-2 target nucleic acids are NOT DETECTED. The SARS-CoV-2 RNA is generally detectable in upper and lower respiratory specimens during the acute phase of infection. Negative results do not preclude SARS-CoV-2 infection, do not rule out co-infections with other pathogens, and should not be used as the sole basis for treatment or other patient management  decisions. Negative results must be combined with clinical observations, patient history, and epidemiological information. The expected result is Negative. Fact Sheet for Patients: SugarRoll.be Fact Sheet for Healthcare Providers: https://www.woods-mathews.com/ This test is not yet approved or cleared by the [place] FDA and  has been authorized for detection and/or diagnosis of SARS-CoV-2 by FDA under an Emergency Use Authorization (EUA). This EUA will remain  in effect (meaning this test can be used) for the duration of the COVID-19 declaration under Section 56 4(b)(1) of the Act, 21 U.S.C. section 360bbb-3(b)(1), unless the authorization is terminated or revoked sooner. Performed at [facility], [place] [address], [place], [place] [address]      Labs: Basic Metabolic Panel: Recent Labs  Lab 03/06/19 0818 03/07/19 0728  NA 135 138  K 3.6 3.7  CL 97* 101  CO2 28 26  GLUCOSE  115* 103*  BUN 39* 34*  CREATININE 1.39* 0.89  CALCIUM 9.0 9.2   Liver Function Tests: No results for input(s): AST, ALT, ALKPHOS, BILITOT, PROT, ALBUMIN in the last 168 hours. No results for input(s): LIPASE, AMYLASE in the last 168 hours. No results for input(s): AMMONIA in the last 168 hours. CBC: Recent Labs  Lab 03/06/19 0818  WBC 7.2  HGB 14.0  HCT 43.1  MCV 106.2*  PLT 173   Cardiac Enzymes: No results for input(s): CKTOTAL, CKMB, CKMBINDEX, TROPONINI in the last 168 hours. BNP: BNP (last 3 results) No results for input(s): BNP in the last 8760 hours.  ProBNP (last 3 results) No results for input(s): PROBNP in the last 8760 hours.  CBG: Recent Labs  Lab 03/05/19 2031  GLUCAP 146*       Signed:  Kayleen Memos, MD [facility] 03/11/2019, 9:50 AM

## 2019-03-11 NOTE — Progress Notes (Signed)
Patient refused low bed and floor mats.

## 2019-03-11 NOTE — Progress Notes (Signed)
OT Cancellation Note  Patient Details Name: Steven Blair MRN: [id number] DOB: [birth year]   Cancelled Treatment:     Patient politely decline OT this morning, reports he is being picked up for rehab "pretty soon" and would like to rest in the chair. Will continue to follow up if D/C delayed.   [place] OT office: [place] 03/11/2019, 10:59 AM

## 2019-03-11 NOTE — TOC Transition Note (Signed)
Transition of Care [facility]) - CM/SW Discharge Note   Patient Details  Name: Steven Blair MRN: [id number] Date of Birth: 10-12-1945  [facility] CM/SW Contact:  Alexander Mt, LCSW Phone Number: 03/11/2019, 10:29 AM   Clinical Narrative:    Pt approved by [facility] for admission to [facility], confirmed by Levada Dy in admissions, for discharge today. CSW has spoken with pt wife [name], she is aware and okay with discharge today. Pt paperwork completed and sent to facility.   PTAR papers completed and updated with todays date. RN aware and will call report.    Final next level of care: Skilled Nursing Facility Barriers to Discharge: Barriers Resolved   Patient Goals and CMS Choice Patient states their goals for this hospitalization and ongoing recovery are:: inpatient rehab versus short term rehab CMS Medicare.gov Compare Post Acute Care list provided to:: Patient Choice offered to / list presented to : Patient, Spouse  Discharge Placement Existing PASRR number confirmed : 03/05/19          Patient chooses bed at: [place] [place] Patient to be transferred to facility by: [facility] Name of family member notified: pt wife Louann Patient and family notified of of transfer: 03/11/19  Discharge Plan and Services In-house Referral: Clinical Social Work Discharge Planning Services: CM Consult Post Acute Care Choice: [place]            Readmission Risk Interventions Readmission Risk Prevention Plan 03/05/2019  Post Dischage Appt Not Complete  Appt Comments disposition pending  Medication Screening Complete  Transportation Screening Complete  Some recent data might be hidden

## 2019-03-11 NOTE — Social Work (Signed)
Clinical Social Worker facilitated patient discharge including contacting patient family and facility to confirm patient discharge plans.  Clinical information faxed to facility and family agreeable with plan.  CSW arranged ambulance transport via PTAR to [facility].  RN to call [phone]  with report prior to discharge.  Clinical Social Worker will sign off for now as social work intervention is no longer needed. Please consult [place] again if new need arises.  Westley Hummer, MSW, LCSW Clinical Social Worker

## 2019-03-11 NOTE — Progress Notes (Signed)
[facility] called and spoke with Teacher, English as a foreign language. [facility] Filter RN gave SBAR report. PTAR in route to hospital to transport patient.

## 2019-03-11 NOTE — Progress Notes (Signed)
PTAR arrived to unit to transport patient to [facility] facility. SBAR report given to transport team. Belongings sent with patient. Discharge paperwork and prescriptions sent with patient.

## 2019-03-11 NOTE — Plan of Care (Signed)

## 2019-03-23 ENCOUNTER — Ambulatory Visit (HOSPITAL_COMMUNITY): Payer: No Typology Code available for payment source | Admitting: Physician Assistant

## 2019-04-09 ENCOUNTER — Ambulatory Visit (HOSPITAL_COMMUNITY): Payer: No Typology Code available for payment source | Admitting: Physician Assistant

## 2019-04-16 ENCOUNTER — Ambulatory Visit (HOSPITAL_COMMUNITY)
Admission: RE | Admit: 2019-04-16 | Discharge: 2019-04-16 | Disposition: A | Discharge status: Home / Self Care | Payer: No Typology Code available for payment source | Source: Ambulatory Visit | Attending: Physician Assistant | Admitting: Physician Assistant

## 2019-04-16 ENCOUNTER — Other Ambulatory Visit: Payer: Self-pay

## 2019-04-16 VITALS — BP 140/70 | HR 112 | Ht 72.0 in | Wt 245.6 lb

## 2019-04-16 DIAGNOSIS — Z87442 Personal history of urinary calculi: Secondary | ICD-10-CM | POA: Insufficient documentation

## 2019-04-16 DIAGNOSIS — Z6833 Body mass index (BMI) 33.0-33.9, adult: Secondary | ICD-10-CM | POA: Diagnosis not present

## 2019-04-16 DIAGNOSIS — Z79899 Other long term (current) drug therapy: Secondary | ICD-10-CM | POA: Diagnosis not present

## 2019-04-16 DIAGNOSIS — E669 Obesity, unspecified: Secondary | ICD-10-CM | POA: Diagnosis not present

## 2019-04-16 DIAGNOSIS — Z882 Allergy status to sulfonamides status: Secondary | ICD-10-CM | POA: Insufficient documentation

## 2019-04-16 DIAGNOSIS — Z86718 Personal history of other venous thrombosis and embolism: Secondary | ICD-10-CM | POA: Insufficient documentation

## 2019-04-16 DIAGNOSIS — I4819 Other persistent atrial fibrillation: Secondary | ICD-10-CM

## 2019-04-16 DIAGNOSIS — K5190 Ulcerative colitis, unspecified, without complications: Secondary | ICD-10-CM | POA: Insufficient documentation

## 2019-04-16 DIAGNOSIS — Z801 Family history of malignant neoplasm of trachea, bronchus and lung: Secondary | ICD-10-CM | POA: Insufficient documentation

## 2019-04-16 DIAGNOSIS — I10 Essential (primary) hypertension: Secondary | ICD-10-CM | POA: Diagnosis not present

## 2019-04-16 DIAGNOSIS — Z7901 Long term (current) use of anticoagulants: Secondary | ICD-10-CM | POA: Insufficient documentation

## 2019-04-16 DIAGNOSIS — D6869 Other thrombophilia: Secondary | ICD-10-CM | POA: Diagnosis not present

## 2019-04-16 DIAGNOSIS — G4733 Obstructive sleep apnea (adult) (pediatric): Secondary | ICD-10-CM | POA: Diagnosis not present

## 2019-04-16 NOTE — Progress Notes (Addendum)
Primary Care Physician: Josetta Huddle, MD Primary Cardiologist: Dr Gwenlyn Found Primary Electrophysiologist: none Referring Physician: Dr Tammi Klippel is a 74 y.o. male with a history of ulcerative colitis, HTN, HLD, obesity, OSA, and paroxysmal atrial fibrillation who presents for consultation in the [facility].  The patient was initially diagnosed with atrial fibrillation remotely in the setting of hip surgery and acute DVT and has had no known recurrrance until presenting to his PCP for his annual phsyical where he was noted to have an irregular pulse. An ECG revealed rate controlled afib. He was started on warfarin and referred to the AF Clinic. Patient reports that he is completely unaware of his arrhythmia. He states that his quality of life is "better than it has been in 20 years." He is very active by walking about one hour daily. He reports that he used to weigh >300 lbs. Patient does have OSA but is not on CPAP after recent weight loss. He does admit to alcohol use, about two drinks daily. He is on Eliquis for CHADS2VASC score of 2.  On follow up today, patient was recently hospitalized for a fall and a broken hip. He did have some elevated heart rates during his admission and his diltiazem was increased. He has been working aggressively with PT and is now using a walker. He has no awareness of his afib. He reports that at home his heart rates have been in the 80s-90s even during activity.  Today, he denies symptoms of palpitations, chest pain, shortness of breath, orthopnea, PND, lower extremity edema, dizziness, presyncope, syncope, snoring, daytime somnolence, bleeding, or neurologic sequela. The patient is tolerating medications without difficulties and is otherwise without complaint today.    Atrial Fibrillation Risk Factors:  he does have symptoms or diagnosis of sleep apnea. he is not on CPAP therapy. he does not have a history of rheumatic  fever. he does have a history of alcohol use. The patient does not have a history of early familial atrial fibrillation or other arrhythmias.  he has a BMI of Body mass index is 33.31 kg/m.Marland Kitchen Filed Weights   04/16/19 1053  Weight: 111.4 kg    Family History  Problem Relation Age of Onset  . Lung cancer Brother      Atrial Fibrillation Management history:  Previous antiarrhythmic drugs: none Previous cardioversions: 6-7 years ago, 11/06/18 Previous ablations: none CHADS2VASC score: 2 Anticoagulation history: Xarelto (for DVT in the past), warfarin, Eliquis   Past Medical History:  Diagnosis Date  . BMI 40.0-44.9, adult (HCC)    Greater than 40 BMI  . 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 Xarelto - no longer taking  . Dysrhythmia    Atrial Fib x1 episode, cardioversion 4'16- NSR  . Fracture of trochanter of right femur ([place]) 02/2019  . History of kidney stones    x1 -20 yrs ago  . Hypertension   . Sleep apnea    no cpap used, couldn't tolerate  . Ulcerative colitis ([place])    history of , some current issues at this time   Past Surgical History:  Procedure Laterality Date  . CARDIOVERSION     tx Paroxysmal Atrial Fibrillation- converted to NSR  . CARDIOVERSION N/A 11/06/2018   Procedure: CARDIOVERSION;  Surgeon: Donato Heinz, MD;  [facility] ENDOSCOPY;  Service: Endoscopy;  Laterality: N/A;  . COLONOSCOPY W/ POLYPECTOMY    . FEMUR SURGERY Left  ORIF 10'05  . FLEXIBLE SIGMOIDOSCOPY N/A 11/01/2014   Procedure: FLEXIBLE SIGMOIDOSCOPY;  Surgeon: Garlan Fair, MD;  Location: [facility] ENDOSCOPY;  Service: Endoscopy;  Laterality: N/A;  . JOINT REPLACEMENT     BTHA    Current Outpatient Medications  Medication Sig Dispense Refill  . acetaminophen (TYLENOL) 500 MG tablet Take 1,500 mg by mouth every 8 (eight) hours as needed for mild pain or moderate pain.     . Cholecalciferol (VITAMIN D3) 50 MCG (2000 UT) capsule Take 2,000 Units by  mouth daily.    Marland Kitchen diltiazem (CARDIZEM CD) 240 MG 24 hr capsule Take 1 capsule (240 mg total) by mouth daily. 60 capsule 0  . ELIQUIS 5 MG TABS tablet TAKE 1 TABLET BY MOUTH 2 TIMES DAILY. 60 tablet 3  . mesalamine (LIALDA) 1.2 g EC tablet Take 2.4 g by mouth daily.    . Multiple Vitamin (MULTIVITAMIN WITH MINERALS) TABS tablet Take 1 tablet by mouth daily. 60 tablet 0   No current facility-administered medications for this encounter.    Allergies  Allergen Reactions  . Sulfa Antibiotics Other (See Comments)    Childhood allergy    Social History   Socioeconomic History  . Marital status: Married    Spouse name: Not on file  . Number of children: Not on file  . Years of education: Not on file  . Highest education level: Not on file  Occupational History  . Not on file  Tobacco Use  . Smoking status: Never Smoker  . Smokeless tobacco: Never Used  Substance and Sexual Activity  . Alcohol use: Yes    Alcohol/week: 3.0 standard drinks    Types: 1 Glasses of wine, 2 Cans of beer per week    Comment: weekend  . Drug use: No  . Sexual activity: Not on file  Other Topics Concern  . Not on file  Social History Narrative  . Not on file   Social Determinants of Health   Financial Resource Strain:   . Difficulty of Paying Living Expenses:   Food Insecurity:   . Worried About Charity fundraiser in the Last Year:   . Arboriculturist in the Last Year:   Transportation Needs:   . Film/video editor (Medical):   Marland Kitchen Lack of Transportation (Non-Medical):   Physical Activity:   . Days of Exercise per Week:   . Minutes of Exercise per Session:   Stress:   . Feeling of Stress :   Social Connections:   . Frequency of Communication with Friends and Family:   . Frequency of Social Gatherings with Friends and Family:   . Attends Religious Services:   . Active Member of Clubs or Organizations:   . Attends Archivist Meetings:   Marland Kitchen Marital Status:   Intimate Partner  Violence:   . Fear of Current or Ex-Partner:   . Emotionally Abused:   Marland Kitchen Physically Abused:   . Sexually Abused:      ROS- All systems are reviewed and negative except as per the HPI above.  Physical Exam: Vitals:   04/16/19 1053  BP: 140/70  Pulse: (!) 112  Weight: 111.4 kg  Height: 6' (1.829 m)    GEN- The patient is well appearing obese male, alert and oriented x 3 today.   HEENT-head normocephalic, atraumatic, sclera clear, conjunctiva pink, hearing intact, trachea midline. Lungs- Clear to ausculation bilaterally, normal work of breathing Heart- irregular rate and rhythm, no murmurs, rubs or gallops  GI- soft, NT, ND, + BS Extremities- no clubbing, cyanosis, or edema MS- no significant deformity or atrophy Skin- no rash or lesion Psych- euthymic mood, full affect Neuro- strength and sensation are intact   Wt Readings from Last 3 Encounters:  04/16/19 111.4 kg  03/01/19 118.7 kg  12/23/18 120.3 kg    EKG today demonstrates afib HR 112, LAFB, slow R wave prog, QRS 90, QTc 472   Echo 12/03/18  1. Left ventricular ejection fraction, by visual estimation, is 45 to 50%. The left ventricle has mildly decreased function. There is mildly increased left ventricular hypertrophy.  2. Left ventricular diastolic parameters are indeterminate.  3. Global right ventricle has normal systolic function.The right ventricular size is mildly enlarged. No increase in right ventricular wall thickness.  4. Left atrial size was severely dilated.  5. Right atrial size was mildly dilated.  6. The mitral valve is normal in structure. No evidence of mitral valve regurgitation.  7. The tricuspid valve is normal in structure. Tricuspid valve regurgitation is not demonstrated.  8. The aortic valve is tricuspid. Aortic valve regurgitation is not visualized. No evidence of aortic valve sclerosis or stenosis.  9. The pulmonic valve was not well visualized. Pulmonic valve regurgitation is not  visualized. 10. There is mild dilatation of the aortic root measuring 40 mm. 11. The inferior vena cava is normal in size with greater than 50% respiratory variability, suggesting right atrial pressure of 3 mmHg.  Epic records are reviewed at length today  Assessment and Plan:  1. Persistent atrial fibrillation S/p DCCV on 11/06/18 with ERAF. Patient in afib with rapid rates today. Pulse rate better on recheck in the 90s bpm. Will order a 3 day Zio patch to evaluate rate control. Continue diltiazem 240 mg daily for now, may titrate up pending monitor results.  Continue Eliquis 5 mg BID  This patients CHA2DS2-VASc Score and unadjusted Ischemic Stroke Rate (% per year) is equal to 2.2 % stroke rate/year from a score of 2  Above score calculated as 1 point each if present [CHF, HTN, DM, Vascular=MI/PAD/Aortic Plaque, Age if 65-74, or Male] Above score calculated as 2 points each if present [Age > 75, or Stroke/TIA/TE]  2. Obesity Body mass index is 33.31 kg/m. Lifestyle modification was discussed and encouraged including regular physical activity and weight reduction.  3. Obstructive sleep apnea Patient no longer on CPAP after significant weight loss.  4. HTN Stable, no changes today.   Follow up in the AF clinic in one month pending monitor results.    [facility] [address] [place], [place] [address] [phone] 04/16/2019 11:22 AM

## 2019-04-16 NOTE — Addendum Note (Signed)
Encounter addended by: Juluis Mire, RN on: 04/16/2019 11:30 AM  Actions taken: Order list changed, Diagnosis association updated

## 2019-04-28 ENCOUNTER — Telehealth (HOSPITAL_COMMUNITY): Payer: Self-pay | Admitting: *Deleted

## 2019-04-28 ENCOUNTER — Other Ambulatory Visit (HOSPITAL_COMMUNITY): Payer: Self-pay | Admitting: *Deleted

## 2019-04-28 MED ORDER — DILTIAZEM HCL ER COATED BEADS 300 MG PO CP24: 300.0000 mg | ORAL_CAPSULE | Freq: Every day | ORAL | 3 refills | Status: DC

## 2019-04-28 NOTE — Telephone Encounter (Signed)
-----   Message from Oliver Barre, [place] sent at 04/28/2019 10:09 AM EDT ----- Regarding: Zio results. Patient still having RVR on Zio patch, AF 100%. Let's increase diltiazem to 300 mg daily. F/u as scheduled later this month. ----- Message ----- From: Juluis Mire, RN Sent: 04/28/2019   9:07 AM EDT To: Oliver Barre, PA

## 2019-04-28 NOTE — Telephone Encounter (Signed)
Patient notified of recommendations. RX sent for 368m of cardizem a day. Follow up in place. Pt verbalized understanding.

## 2019-05-03 ENCOUNTER — Other Ambulatory Visit (HOSPITAL_COMMUNITY): Payer: Self-pay | Admitting: *Deleted

## 2019-05-03 MED ORDER — DILTIAZEM HCL ER COATED BEADS 300 MG PO CP24: 300.0000 mg | ORAL_CAPSULE | Freq: Every day | ORAL | 3 refills | Status: DC

## 2019-05-18 ENCOUNTER — Ambulatory Visit (HOSPITAL_COMMUNITY): Payer: No Typology Code available for payment source | Admitting: Physician Assistant

## 2019-06-03 ENCOUNTER — Ambulatory Visit (HOSPITAL_COMMUNITY): Payer: No Typology Code available for payment source | Admitting: Physician Assistant

## 2019-06-15 ENCOUNTER — Other Ambulatory Visit (HOSPITAL_COMMUNITY): Payer: Self-pay | Admitting: *Deleted

## 2019-06-15 MED ORDER — DILTIAZEM HCL ER COATED BEADS 300 MG PO CP24: 300.0000 mg | ORAL_CAPSULE | Freq: Every day | ORAL | 3 refills | Status: DC

## 2019-06-28 ENCOUNTER — Other Ambulatory Visit (HOSPITAL_COMMUNITY): Payer: Self-pay | Admitting: *Deleted

## 2019-06-28 MED ORDER — APIXABAN 5 MG PO TABS: 5.0000 mg | ORAL_TABLET | Freq: Two times a day (BID) | ORAL | 6 refills | Status: DC

## 2019-07-02 ENCOUNTER — Other Ambulatory Visit: Payer: Self-pay | Admitting: Orthopedic Surgery

## 2019-07-02 ENCOUNTER — Other Ambulatory Visit (HOSPITAL_COMMUNITY): Payer: Self-pay | Admitting: Orthopedic Surgery

## 2019-07-02 DIAGNOSIS — Z96641 Presence of right artificial hip joint: Secondary | ICD-10-CM

## 2019-07-09 ENCOUNTER — Encounter (HOSPITAL_COMMUNITY)
Admission: RE | Admit: 2019-07-09 | Discharge: 2019-07-09 | Disposition: A | Discharge status: Home / Self Care | Payer: No Typology Code available for payment source | Source: Ambulatory Visit | Attending: Orthopedic Surgery | Admitting: Orthopedic Surgery

## 2019-07-09 ENCOUNTER — Other Ambulatory Visit: Payer: Self-pay

## 2019-07-09 ENCOUNTER — Ambulatory Visit (HOSPITAL_COMMUNITY)
Admission: RE | Admit: 2019-07-09 | Discharge: 2019-07-09 | Disposition: A | Discharge status: Home / Self Care | Payer: No Typology Code available for payment source | Source: Ambulatory Visit | Attending: Orthopedic Surgery | Admitting: Orthopedic Surgery

## 2019-07-09 DIAGNOSIS — Z96641 Presence of right artificial hip joint: Secondary | ICD-10-CM | POA: Insufficient documentation

## 2019-07-09 MED ORDER — TECHNETIUM TC 99M MEDRONATE IV KIT
20.0000 | PACK | Freq: Once | INTRAVENOUS | Status: AC | PRN
  Administered 2019-07-09: 20 via INTRAVENOUS

## 2019-07-16 ENCOUNTER — Other Ambulatory Visit (HOSPITAL_COMMUNITY): Payer: Self-pay | Admitting: *Deleted

## 2019-07-16 DIAGNOSIS — I4819 Other persistent atrial fibrillation: Secondary | ICD-10-CM

## 2019-07-19 ENCOUNTER — Ambulatory Visit (HOSPITAL_COMMUNITY): Payer: No Typology Code available for payment source | Admitting: Physician Assistant

## 2019-07-19 NOTE — Progress Notes (Signed)
Cardiology Office Note:   Date:  07/21/2019  NAME:  Steven Blair    MRN: [id number] DOB:  11/13/1945   PCP:  Josetta Huddle, MD  Cardiologist:  No primary care provider on file.  Electrophysiologist:  None   Referring MD: Josetta Huddle, MD   Chief Complaint  Patient presents with  . Pre-op Exam   History of Present Illness:   Steven Blair is a 74 y.o. male with a hx of persistent Afib, HTN, HLD, OSA, ulcerative colitis who is being seen today for the evaluation of preoperative assessment at the request of Josetta Huddle, MD.  He reports that he has had both hips replaced.  Apparently his right hip replacement will need revision surgery.  He will have this done soon.  He is here for preoperative assessment.  Regarding restrictions with activity he has none.  He has no chest pain or shortness of breath with walking.  He does participate in physical therapy 30 minutes/day without any chest pain or shortness of breath.  He reports he can go up a flight of stairs several times a day without any symptoms.  He seems to be doing quite well from a cardiovascular standpoint.  His echocardiogram shows his EF is low normal.  He has no evidence of heart failure on exam.  He seems to be doing well.  His heart rates are well controlled in the 80s.  Blood pressure control as well.  He is on Eliquis and having no bleeding issues.  He is apparently decided on permanent rate control for his A. fib.  He has no symptoms from this.  I think this is reasonable.  He seems to be doing well.  He denies any chest pain shortness of breath or palpitations today.  Problem List 1. Ulcerative colitis 2. HTN 3. HLD 4. Persistent Afib -dx 09/2018 -DCCV 11/06/2018 with return of Afib -evaluated 12/2018 in Afib clinic and decided on rate control  5. OSA  Past Medical History: Past Medical History:  Diagnosis Date  . Arrhythmia   . BMI 40.0-44.9, adult (HCC)    Greater than 40 BMI  . DVT of leg (deep venous  thrombosis) (HCC)    left leg tx. Xarelto - no longer taking  . Dysrhythmia    Atrial Fib x1 episode, cardioversion 4'16- NSR  . Fracture of trochanter of right femur ([place]) 02/2019  . History of kidney stones    x1 -20 yrs ago  . Hypertension   . Sleep apnea    no cpap used, couldn't tolerate  . Ulcerative colitis ([place])    history of , some current issues at this time    Past Surgical History: Past Surgical History:  Procedure Laterality Date  . CARDIOVERSION     tx Paroxysmal Atrial Fibrillation- converted to NSR  . CARDIOVERSION N/A 11/06/2018   Procedure: CARDIOVERSION;  Surgeon: Donato Heinz, MD;  Location: [facility] ENDOSCOPY;  Service: Endoscopy;  Laterality: N/A;  . COLONOSCOPY W/ POLYPECTOMY    . FEMUR SURGERY Left    ORIF 10'05  . FLEXIBLE SIGMOIDOSCOPY N/A 11/01/2014   Procedure: FLEXIBLE SIGMOIDOSCOPY;  Surgeon: Garlan Fair, MD;  Location: [facility] ENDOSCOPY;  Service: Endoscopy;  Laterality: N/A;  . JOINT REPLACEMENT     BTHA    Current Medications: Current Meds  Medication Sig  . acetaminophen (TYLENOL) 500 MG tablet Take 1,500 mg by mouth every 8 (eight) hours as needed for mild pain or moderate pain.   Marland Kitchen apixaban (ELIQUIS) 5  MG TABS tablet Take 1 tablet (5 mg total) by mouth 2 (two) times daily.  . Cholecalciferol (VITAMIN D3) 50 MCG (2000 UT) capsule Take 2,000 Units by mouth daily.  Marland Kitchen diltiazem (CARDIZEM CD) 300 MG 24 hr capsule Take 1 capsule (300 mg total) by mouth daily.  . mesalamine (LIALDA) 1.2 g EC tablet Take 2.4 g by mouth daily.     Allergies:    Sulfa antibiotics   Social History: Social History   Socioeconomic History  . Marital status: Married    Spouse name: Not on file  . Number of children: Not on file  . Years of education: Not on file  . Highest education level: Not on file  Occupational History  . Not on file  Tobacco Use  . Smoking status: Never Smoker  . Smokeless tobacco: Never Used  Vaping Use  . Vaping Use: Never  used  Substance and Sexual Activity  . Alcohol use: Yes    Alcohol/week: 3.0 standard drinks    Types: 1 Glasses of wine, 2 Cans of beer per week    Comment: weekend  . Drug use: No  . Sexual activity: Not on file  Other Topics Concern  . Not on file  Social History Narrative  . Not on file   Social Determinants of Health   Financial Resource Strain:   . Difficulty of Paying Living Expenses:   Food Insecurity:   . Worried About Charity fundraiser in the Last Year:   . Arboriculturist in the Last Year:   Transportation Needs:   . Film/video editor (Medical):   Marland Kitchen Lack of Transportation (Non-Medical):   Physical Activity:   . Days of Exercise per Week:   . Minutes of Exercise per Session:   Stress:   . Feeling of Stress :   Social Connections:   . Frequency of Communication with Friends and Family:   . Frequency of Social Gatherings with Friends and Family:   . Attends Religious Services:   . Active Member of Clubs or Organizations:   . Attends Archivist Meetings:   Marland Kitchen Marital Status:      Family History: The patient's family history includes Lung cancer in his brother.  ROS:   All other ROS reviewed and negative. Pertinent positives noted in the HPI.     EKGs/Labs/Other Studies Reviewed:   The following studies were personally reviewed by me today:  EKG:  EKG is ordered today.  The ekg ordered today demonstrates atrial fibrillation, heart rate 101, left axis deviation, and was personally reviewed by me.   TTE 12/03/2018 1. Left ventricular ejection fraction, by visual estimation, is 45 to  50%. The left ventricle has mildly decreased function. There is mildly  increased left ventricular hypertrophy.  2. Left ventricular diastolic parameters are indeterminate.  3. Global right ventricle has normal systolic function.The right  ventricular size is mildly enlarged. No increase in right ventricular wall  thickness.  4. Left atrial size was severely  dilated.  5. Right atrial size was mildly dilated.  6. The mitral valve is normal in structure. No evidence of mitral valve  regurgitation.  7. The tricuspid valve is normal in structure. Tricuspid valve  regurgitation is not demonstrated.  8. The aortic valve is tricuspid. Aortic valve regurgitation is not  visualized. No evidence of aortic valve sclerosis or stenosis.  9. The pulmonic valve was not well visualized. Pulmonic valve  regurgitation is not visualized.  10. There  is mild dilatation of the aortic root measuring 40 mm.  11. The inferior vena cava is normal in size with greater than 50%  respiratory variability, suggesting right atrial pressure of 3 mmHg.   Recent Labs: 03/06/2019: Hemoglobin 14.0; Platelets 173 03/07/2019: BUN 34; Creatinine, Ser 0.89; Potassium 3.7; Sodium 138   Recent Lipid Panel No results found for: CHOL, TRIG, HDL, CHOLHDL, VLDL, LDLCALC, LDLDIRECT  Physical Exam:   VS:  BP 118/78   Pulse (!) 101   Temp (!) 97.2 F (36.2 C)   Ht 6' (1.829 m)   Wt 233 lb (105.7 kg)   SpO2 96%   BMI 31.60 kg/m    Wt Readings from Last 3 Encounters:  07/21/19 233 lb (105.7 kg)  04/16/19 245 lb 9.6 oz (111.4 kg)  03/01/19 261 lb 11 oz (118.7 kg)    General: Well nourished, well developed, in no acute distress Heart: Atraumatic, normal size  Eyes: PEERLA, EOMI  Neck: Supple, no JVD Endocrine: No thryomegaly Cardiac: Normal S1, S2; irregular rhythm, no murmurs rubs or gallops Lungs: Clear to auscultation bilaterally, no wheezing, rhonchi or rales  Abd: Soft, nontender, no hepatomegaly  Ext: No edema, pulses 2+ Musculoskeletal: No deformities, BUE and BLE strength normal and equal Skin: Warm and dry, no rashes   Neuro: Alert and oriented to person, place, time, and situation, CNII-XII grossly intact, no focal deficits  Psych: Normal mood and affect   ASSESSMENT:   NASIIR MONTS is a 74 y.o. male who presents for the following: 1. Preop cardiovascular  exam   2. Persistent atrial fibrillation ([place])   3. Essential hypertension   4. Mixed hyperlipidemia     PLAN:   1. Preop cardiovascular exam -RCRI equals 0.  This equates to a 0.4% risk of a major perioperative perivascular event.  This is very low risk.  He is able to complete greater than 4 METS without any limitations.  He denies any symptoms of chest pain or shortness of breath.  He may proceed with surgery at acceptable risk. -He will hold his Eliquis 3 days before surgery at the discretion of orthopedics.  He will then resume at their discretion.  There is no rush to restart anticoagulation.  They will do so when hemostasis is achieved his bleeding risk is low. -I would recommend continue his diltiazem in the perioperative period.  2. Persistent atrial fibrillation (HCC) -CHADSVASC=2 (HTN, age).  -He is decided on permanent rate control.  He will continue his diltiazem.  He will continue on Eliquis 5 mg twice daily.  He will interrupt this for surgery.  We will see him back on a yearly basis.  3. Essential hypertension -Acceptable today.  4. Mixed hyperlipidemia -Acceptable.   Disposition: Return in about 1 year (around 07/20/2020).  Medication Adjustments/Labs and Tests Ordered: Current medicines are reviewed at length with the patient today.  Concerns regarding medicines are outlined above.  Orders Placed This Encounter  Procedures  . EKG 12-Lead   No orders of the defined types were placed in this encounter.   Patient Instructions  Medication Instructions:  The current medical regimen is effective;  continue present plan and medications.  *If you need a refill on your cardiac medications before your next appointment, please call your pharmacy*   Follow-Up: At [facility], you and your health needs are our priority.  As part of our continuing mission to provide you with exceptional heart care, we have created designated Provider Care Teams.  These Care  Teams  include your primary Cardiologist (physician) and Advanced Practice Providers (APPs -  Physician Assistants and Nurse Practitioners) who all work together to provide you with the care you need, when you need it.  We recommend signing up for the patient portal called "MyChart".  Sign up information is provided on this After Visit Summary.  MyChart is used to connect with patients for Virtual Visits (Telemedicine).  Patients are able to view lab/test results, encounter notes, upcoming appointments, etc.  Non-urgent messages can be sent to your provider as well.   To learn more about what you can do with MyChart, go to [email or web address].    Your next appointment:   12 month(s)  The format for your next appointment:   In Person  Provider:   Eleonore Chiquito, MD        Time Spent with Patient: I have spent a total of 35 minutes with patient reviewing hospital notes, telemetry, EKGs, labs and examining the patient as well as establishing an assessment and plan that was discussed with the patient.  > 50% of time was spent in direct patient care.  Signed, Addison Naegeli. Audie Box, [place]  [address] [place], [place] [address] [phone]  07/21/2019 11:30 AM

## 2019-07-21 ENCOUNTER — Encounter: Payer: Self-pay | Admitting: Cardiovascular Disease

## 2019-07-21 ENCOUNTER — Other Ambulatory Visit: Payer: Self-pay

## 2019-07-21 ENCOUNTER — Ambulatory Visit (INDEPENDENT_AMBULATORY_CARE_PROVIDER_SITE_OTHER): Payer: No Typology Code available for payment source | Admitting: Cardiovascular Disease

## 2019-07-21 VITALS — BP 118/78 | HR 101 | Temp 97.2°F | Ht 72.0 in | Wt 233.0 lb

## 2019-07-21 DIAGNOSIS — I4819 Other persistent atrial fibrillation: Secondary | ICD-10-CM | POA: Diagnosis not present

## 2019-07-21 DIAGNOSIS — E782 Mixed hyperlipidemia: Secondary | ICD-10-CM | POA: Diagnosis not present

## 2019-07-21 DIAGNOSIS — Z01810 Encounter for preprocedural cardiovascular examination: Secondary | ICD-10-CM

## 2019-07-21 DIAGNOSIS — I10 Essential (primary) hypertension: Secondary | ICD-10-CM

## 2019-07-21 NOTE — Patient Instructions (Signed)
Medication Instructions:  The current medical regimen is effective;  continue present plan and medications.  *If you need a refill on your cardiac medications before your next appointment, please call your pharmacy*   Follow-Up: At [facility], you and your health needs are our priority.  As part of our continuing mission to provide you with exceptional heart care, we have created designated Provider Care Teams.  These Care Teams include your primary Cardiologist (physician) and Advanced Practice Providers (APPs -  Physician Assistants and Nurse Practitioners) who all work together to provide you with the care you need, when you need it.  We recommend signing up for the patient portal called "MyChart".  Sign up information is provided on this After Visit Summary.  MyChart is used to connect with patients for Virtual Visits (Telemedicine).  Patients are able to view lab/test results, encounter notes, upcoming appointments, etc.  Non-urgent messages can be sent to your provider as well.   To learn more about what you can do with MyChart, go to [email or web address].    Your next appointment:   12 month(s)  The format for your next appointment:   In Person  Provider:   Eleonore Chiquito, MD

## 2019-07-29 ENCOUNTER — Other Ambulatory Visit (HOSPITAL_COMMUNITY): Payer: Self-pay | Admitting: Physician Assistant

## 2019-08-09 ENCOUNTER — Encounter (HOSPITAL_COMMUNITY): Payer: Self-pay

## 2019-08-09 ENCOUNTER — Other Ambulatory Visit: Payer: Self-pay

## 2019-08-09 ENCOUNTER — Encounter (HOSPITAL_COMMUNITY)
Admission: RE | Admit: 2019-08-09 | Discharge: 2019-08-09 | Disposition: A | Discharge status: Home / Self Care | Payer: No Typology Code available for payment source | Source: Ambulatory Visit | Attending: Orthopedic Surgery | Admitting: Orthopedic Surgery

## 2019-08-09 ENCOUNTER — Ambulatory Visit (HOSPITAL_COMMUNITY): Payer: No Typology Code available for payment source | Admitting: Physician Assistant

## 2019-08-09 DIAGNOSIS — Z01812 Encounter for preprocedural laboratory examination: Secondary | ICD-10-CM | POA: Insufficient documentation

## 2019-08-09 HISTORY — DX: Peripheral vascular disease, unspecified: I73.9

## 2019-08-09 HISTORY — DX: Unspecified osteoarthritis, unspecified site: M19.90

## 2019-08-09 LAB — CBC
HCT: 46 % (ref 39.0–52.0)
Hemoglobin: 15.3 g/dL (ref 13.0–17.0)
MCH: 35.3 pg — ABNORMAL HIGH (ref 26.0–34.0)
MCHC: 33.3 g/dL (ref 30.0–36.0)
MCV: 106 fL — ABNORMAL HIGH (ref 80.0–100.0)
Platelets: 200 10*3/uL (ref 150–400)
RBC: 4.34 MIL/uL (ref 4.22–5.81)
RDW: 12.1 % (ref 11.5–15.5)
WBC: 6.6 10*3/uL (ref 4.0–10.5)
nRBC: 0 % (ref 0.0–0.2)

## 2019-08-09 LAB — BASIC METABOLIC PANEL
Anion gap: 9 (ref 5–15)
BUN: 19 mg/dL (ref 8–23)
CO2: 26 mmol/L (ref 22–32)
Calcium: 9.1 mg/dL (ref 8.9–10.3)
Chloride: 104 mmol/L (ref 98–111)
Creatinine, Ser: 0.83 mg/dL (ref 0.61–1.24)
GFR calc Af Amer: >60 mL/min (ref >60)
GFR calc non Af Amer: >60 mL/min (ref >60)
Glucose, Bld: 99 mg/dL (ref 70–99)
Potassium: 4.1 mmol/L (ref 3.5–5.1)
Sodium: 139 mmol/L (ref 135–145)

## 2019-08-09 LAB — SURGICAL PCR SCREEN
MRSA, PCR: NEGATIVE
Staphylococcus aureus: NEGATIVE

## 2019-08-09 NOTE — Progress Notes (Signed)
COVID Vaccine Completed:Yes Date COVID Vaccine completed:03/18/19 COVID vaccine manufacturer: Pfizer      PCP - Dr. Brayton Layman Cardiologist - Dr. Viona Gilmore. O'Neal  Chest x-ray - no EKG - 07/21/19 Stress Test - no ECHO - 12/03/18 Cardiac Cath - no  Sleep Study - yes CPAP - Pt  Lost significant Wt and no longer needs it  Fasting Blood Sugar -NA  Checks Blood Sugar _____ times a day  Blood Thinner Instructions:Eliquis/ Dr. Audie Box Aspirin Instructions:Stop 3 days prior to DOS/ Dr. Alvan Dame Last Dose:08/16/19  Anesthesia review:   Patient denies shortness of breath, fever, cough and chest pain at PAT appointment yes   Patient verbalized understanding of instructions that were given to them at the PAT appointment. Patient was also instructed that they will need to review over the PAT instructions again at home before surgery. Yes  Pt uses a cane at home and can climb 2 flights of stairs without SOB. He was active prior to hip pain.

## 2019-08-09 NOTE — Patient Instructions (Addendum)
DUE TO COVID-19 ONLY ONE VISITOR IS ALLOWED TO COME WITH YOU AND STAY IN THE WAITING ROOM ONLY DURING PRE OP AND PROCEDURE DAY OF SURGERY. THE 2 VISITORS MAY VISIT WITH YOU AFTER SURGERY IN YOUR PRIVATE ROOM DURING VISITING HOURS ONLY!  YOU NEED TO HAVE A COVID 19 TEST ON__7/26/21_____ @_12 :00 pm______, THIS TEST MUST BE DONE BEFORE SURGERY, COME  Steven Blair , [id number].  ([place]) ONCE YOUR COVID TEST IS COMPLETED, PLEASE BEGIN THE QUARANTINE INSTRUCTIONS AS OUTLINED IN YOUR HANDOUT.                Steven Blair    Your procedure is scheduled on: 08/19/19   Report to [facility] Main  Entrance   Report to admitting at  6:10 AM     Call this number if you have problems the morning of surgery [place], NO CHEWING GUM [place].  Do not eat food After Midnight  . YOU MAY HAVE CLEAR LIQUIDS FROM MIDNIGHT UNTIL 6:00 AM.   At 6:00 AM Please finish the prescribed Pre-Surgery  drink.   Nothing by mouth after you finish the  drink !    Take these medicines the morning of surgery with A SIP OF WATER: Cardizem, Meselanine                                 You may not have any metal on your body including               piercings  Do not wear jewelry,  lotions, powders or deodorant             Do not wear nail polish on your fingernails.  Do not shave  48 hours prior to surgery.     Men may shave face and neck.   Do not bring valuables to the hospital. [place].  Contacts, dentures or bridgework may not be worn into surgery.       Special Instructions: N/A              Please read over the following fact sheets you were given: _____________________________________________________________________             [facility] - Preparing for Surgery Before surgery, you can play an important role.   Because skin is not  sterile, your skin needs to be as free of germs as possible.   You can reduce the number of germs on your skin by washing with CHG (chlorahexidine gluconate) soap before surgery.   CHG is an antiseptic cleaner which kills germs and bonds with the skin to continue killing germs even after washing. Please DO NOT use if you have an allergy to CHG or antibacterial soaps.   If your skin becomes reddened/irritated stop using the CHG and inform your nurse when you arrive at Short Stay.   You may shave your face/neck.  Please follow these instructions carefully:  1.  Shower with CHG Soap the night before surgery and the  morning of Surgery.  2.  If you choose to wash your hair, wash your hair first as usual with your  normal  shampoo.  3.  After you shampoo, rinse your hair  and body thoroughly to remove the  shampoo.                                        4.  Use CHG as you would any other liquid soap.  You can apply chg directly  to the skin and wash                       Gently with a scrungie or clean washcloth.  5.  Apply the CHG Soap to your body ONLY FROM THE NECK DOWN.   Do not use on face/ open                           Wound or open sores. Avoid contact with eyes, ears mouth and genitals (private parts).                       Wash face,  Genitals (private parts) with your normal soap.             6.  Wash thoroughly, paying special attention to the area where your surgery  will be performed.  7.  Thoroughly rinse your body with warm water from the neck down.  8.  DO NOT shower/wash with your normal soap after using and rinsing off  the CHG Soap.            9.  Pat yourself dry with a clean towel.            10.  Wear clean pajamas.            11.  Place clean sheets on your bed the night of your first shower and do not  sleep with pets. Day of Surgery : Do not apply any lotions/deodorants the morning of surgery.  Please wear clean clothes to the hospital/surgery center.  FAILURE TO FOLLOW  THESE INSTRUCTIONS MAY RESULT IN THE CANCELLATION OF YOUR SURGERY PATIENT SIGNATURE_________________________________  NURSE SIGNATURE__________________________________  ________________________________________________________________________   Steven Blair  An incentive spirometer is a tool that can help keep your lungs clear and active. This tool measures how well you are filling your lungs with each breath. Taking long deep breaths may help reverse or decrease the chance of developing breathing (pulmonary) problems (especially infection) following:  A long period of time when you are unable to move or be active. BEFORE THE PROCEDURE   If the spirometer includes an indicator to show your best effort, your nurse or respiratory therapist will set it to a desired goal.  If possible, sit up straight or lean slightly forward. Try not to slouch.  Hold the incentive spirometer in an upright position. INSTRUCTIONS FOR USE  1. Sit on the edge of your bed if possible, or sit up as far as you can in bed or on a chair. 2. Hold the incentive spirometer in an upright position. 3. Breathe out normally. 4. Place the mouthpiece in your mouth and seal your lips tightly around it. 5. Breathe in slowly and as deeply as possible, raising the piston or the ball toward the top of the column. 6. Hold your breath for 3-5 seconds or for as long as possible. Allow the piston or ball to fall to the bottom of the column. 7. Remove the mouthpiece from your mouth and breathe out normally. 8.  Rest for a few seconds and repeat Steps 1 through 7 at least 10 times every 1-2 hours when you are awake. Take your time and take a few normal breaths between deep breaths. 9. The spirometer may include an indicator to show your best effort. Use the indicator as a goal to work toward during each repetition. 10. After each set of 10 deep breaths, practice coughing to be sure your lungs are clear. If you have an incision  (the cut made at the time of surgery), support your incision when coughing by placing a pillow or rolled up towels firmly against it. Once you are able to get out of bed, walk around indoors and cough well. You may stop using the incentive spirometer when instructed by your caregiver.  RISKS AND COMPLICATIONS  Take your time so you do not get dizzy or light-headed.  If you are in pain, you may need to take or ask for pain medication before doing incentive spirometry. It is harder to take a deep breath if you are having pain. AFTER USE  Rest and breathe slowly and easily.  It can be helpful to keep track of a log of your progress. Your caregiver can provide you with a simple table to help with this. If you are using the spirometer at home, follow these instructions: [place] IF:   You are having difficultly using the spirometer.  You have trouble using the spirometer as often as instructed.  Your pain medication is not giving enough relief while using the spirometer.  You develop fever of 100.5 F (38.1 C) or higher. SEEK IMMEDIATE MEDICAL CARE IF:   You cough up bloody sputum that had not been present before.  You develop fever of 102 F (38.9 C) or greater.  You develop worsening pain at or near the incision site. MAKE SURE YOU:   Understand these instructions.  Will watch your condition.  Will get help right away if you are not doing well or get worse. Document Released: 05/20/2006 Document Revised: 04/01/2011 Document Reviewed: 07/21/2006 [facility] Patient Information 2014 [place], [place].   ________________________________________________________________________

## 2019-08-13 NOTE — Progress Notes (Signed)
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 never smoker with h/o HTN, a-fib (on Eliquis), PVD, failed right total hip arthroplasty scheduled for above procedure 08/19/2019 with Dr. Paralee Cancel.   Pt seen by cardiology 07/21/2019 for preoperative evaluation.  Per OV note, "-RCRI equals 0.  This equates to a 0.4% risk of a major perioperative perivascular event.  This is very low risk.  He is able to complete greater than 4 METS without any limitations.  He denies any symptoms of chest pain or shortness of breath.  He may proceed with surgery at acceptable risk. -He will hold his Eliquis 3 days before surgery at the discretion of orthopedics.  He will then resume at their discretion.  There is no rush to restart anticoagulation.  They will do so when hemostasis is achieved his bleeding risk is low. -I would recommend continue his diltiazem in the perioperative period."  Anticipate pt can proceed with planned procedure barring acute status change.   VS: BP 134/64 (BP Location: Left Arm)   Pulse 95   Temp 37.2 C (Oral)   Resp 17   Ht 6' (1.829 m)   Wt 108.4 kg   SpO2 98%   BMI 32.41 kg/m   PROVIDERS: Josetta Huddle, MD  Eleonore Chiquito, MD is Cardiologist  LABS: Labs reviewed: Acceptable for surgery. (all labs ordered are listed, but only abnormal results are displayed)  Labs Reviewed  CBC - Abnormal; Notable for the following components:      Result Value   MCV 106.0 (*)    MCH 35.3 (*)    All other components within normal limits  SURGICAL PCR SCREEN  BASIC METABOLIC PANEL  TYPE AND SCREEN     IMAGES:   EKG: 07/21/2019 Rate 101 bpm  Atrial fibrillation w/RVR Left axis deviation   CV: Echo 12/03/2018 IMPRESSIONS    1. Left  ventricular ejection fraction, by visual estimation, is 45 to  50%. The left ventricle has mildly decreased function. There is mildly  increased left ventricular hypertrophy.  2. Left ventricular diastolic parameters are indeterminate.  3. Global right ventricle has normal systolic function.The right  ventricular size is mildly enlarged. No increase in right ventricular wall  thickness.  4. Left atrial size was severely dilated.  5. Right atrial size was mildly dilated.  6. The mitral valve is normal in structure. No evidence of mitral valve  regurgitation.  7. The tricuspid valve is normal in structure. Tricuspid valve  regurgitation is not demonstrated.  8. The aortic valve is tricuspid. Aortic valve regurgitation is not  visualized. No evidence of aortic valve sclerosis or stenosis.  9. The pulmonic valve was not well visualized. Pulmonic valve  regurgitation is not visualized.  10. There is mild dilatation of the aortic root measuring 40 mm.  11. The inferior vena cava is normal in size with greater than 50%  respiratory variability, suggesting right atrial pressure of 3 mmHg.  Past Medical History:  Diagnosis Date  . Arrhythmia   . Arthritis    hands shoulders, hips  . BMI 40.0-44.9, adult (HCC)    Greater than 40 BMI  . 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  Xarelto - no longer taking  . Dysrhythmia    Atrial Fib x1 episode, cardioversion 4'16- NSR  . Fracture of trochanter of right femur ([place]) 02/2019  . History of kidney stones    x1 -20 yrs ago  . Hypertension   . Peripheral vascular disease ([place])    DVT 2014  . Ulcerative colitis ([place])    history of , some current issues at this time    Past Surgical History:  Procedure Laterality Date  . CARDIOVERSION     tx Paroxysmal Atrial Fibrillation- converted to NSR  . CARDIOVERSION N/A 11/06/2018   Procedure: CARDIOVERSION;  Surgeon: Donato Heinz, MD;  Location: [facility] ENDOSCOPY;   Service: Endoscopy;  Laterality: N/A;  . COLONOSCOPY W/ POLYPECTOMY    . EYE SURGERY Right 2016  . FEMUR SURGERY Left    ORIF 10'05  . FLEXIBLE SIGMOIDOSCOPY N/A 11/01/2014   Procedure: FLEXIBLE SIGMOIDOSCOPY;  Surgeon: Garlan Fair, MD;  Location: [facility] ENDOSCOPY;  Service: Endoscopy;  Laterality: N/A;  . JOINT REPLACEMENT     BTHA    MEDICATIONS: . acetaminophen (TYLENOL) 500 MG tablet  . apixaban (ELIQUIS) 5 MG TABS tablet  . Cholecalciferol (VITAMIN D3) 50 MCG (2000 UT) capsule  . diltiazem (CARDIZEM CD) 300 MG 24 hr capsule  . diltiazem (CARDIZEM CD) 300 MG 24 hr capsule  . mesalamine (LIALDA) 1.2 g EC tablet   No current facility-administered medications for this encounter.    Konrad Felix, PA-C [facility] Pre-Surgical Testing [phone]

## 2019-08-13 NOTE — Anesthesia Preprocedure Evaluation (Addendum)
Anesthesia Evaluation  Patient identified by MRN, date of birth, ID band Patient awake    Reviewed: Allergy & Precautions, NPO status , Patient's Chart, lab work & pertinent test results  Airway Mallampati: II  TM Distance: >3 FB Neck ROM: Full    Dental  (+) Teeth Intact, Dental Advisory Given   Pulmonary sleep apnea ,    Pulmonary exam normal breath sounds clear to auscultation       Cardiovascular hypertension, Pt. on medications (-) angina+ Peripheral Vascular Disease and + DVT  (-) CAD and (-) Past MI Normal cardiovascular exam+ dysrhythmias Atrial Fibrillation  Rhythm:Regular Rate:Normal     Neuro/Psych negative neurological ROS  negative psych ROS   GI/Hepatic Neg liver ROS, PUD, UC   Endo/Other  Obesity   Renal/GU negative Renal ROS     Musculoskeletal  (+) Arthritis ,   Abdominal   Peds  Hematology  (+) Blood dyscrasia (Eliquis), , Plt 200k   Anesthesia Other Findings   Reproductive/Obstetrics                           Anesthesia Physical Anesthesia Plan  ASA: III  Anesthesia Plan: Spinal   Post-op Pain Management:    Induction: Intravenous  PONV Risk Score and Plan: 1 and Propofol infusion and Treatment may vary due to age or medical condition  Airway Management Planned: Natural Airway and Nasal Cannula  Additional Equipment:   Intra-op Plan:   Post-operative Plan:   Informed Consent: I have reviewed the patients History and Physical, chart, labs and discussed the procedure including the risks, benefits and alternatives for the proposed anesthesia with the patient or authorized representative who has indicated his/her understanding and acceptance.     Dental advisory given  Plan Discussed with: CRNA  Anesthesia Plan Comments: (See PAT note 08/09/19, Konrad Felix, PA-C)      Anesthesia Quick Evaluation

## 2019-08-16 ENCOUNTER — Other Ambulatory Visit (HOSPITAL_COMMUNITY)
Admission: RE | Admit: 2019-08-16 | Discharge: 2019-08-16 | Disposition: A | Discharge status: Home / Self Care | Payer: No Typology Code available for payment source | Source: Ambulatory Visit | Attending: Orthopedic Surgery | Admitting: Orthopedic Surgery

## 2019-08-16 DIAGNOSIS — Z01812 Encounter for preprocedural laboratory examination: Secondary | ICD-10-CM | POA: Diagnosis not present

## 2019-08-16 DIAGNOSIS — Z20822 Contact with and (suspected) exposure to covid-19: Secondary | ICD-10-CM | POA: Diagnosis not present

## 2019-08-16 LAB — SARS CORONAVIRUS 2 (TAT 6-24 HRS): SARS Coronavirus 2: NEGATIVE

## 2019-08-16 NOTE — H&P (Signed)
TOTAL HIP REVISION ADMISSION H&P  Patient is admitted for right revision total hip arthroplasty.  Subjective:  Chief Complaint: Failed right THA  HPI: Steven Blair, 74 y.o. male, has a history of pain and functional disability in the right hip due to failed previous arthroplasty and patient has failed non-surgical conservative treatments for greater than 12 weeks to include NSAID's and/or analgesics, use of assistive devices and activity modification. The indications for the revision total hip arthroplasty are loosening of one or more components.  Onset of symptoms was gradual starting <1 year ago with rapidlly worsening course since that time.  Prior procedures on the right hip include arthroplasty.  Patient currently rates pain in the right hip at 6 out of 10 with activity, more complaints of instability.  There is worsening of pain with activity and weight bearing, trendelenberg gait, pain that interfers with activities of daily living and pain with passive range of motion. Patient has evidence of prosthetic loosening by imaging studies.  This condition presents safety issues increasing the risk of falls.   There is no current active infection.  Risks, benefits and expectations were discussed with the patient.  Risks including but not limited to the risk of anesthesia, blood clots, nerve damage, blood vessel damage, failure of the prosthesis, infection and up to and including death.  Patient understand the risks, benefits and expectations and wishes to proceed with surgery.   PCP: Josetta Huddle, MD  D/C Plans:       Home   Post-op Meds:       No Rx given   Tranexamic Acid:      To be given - IV   Decadron:      Is to be given  FYI:      Eliquis (on pre-op due to a-fib)  Norco  DME:   Pt already has equipment  PT:   HEP  Pharmacy: [facility]   Patient Active Problem List   Diagnosis Date Noted  . Avulsion fracture of condyle of femur ([place]) 03/02/2019  . Closed  avulsion fracture of lesser trochanter of femur, right, initial encounter ([place]) 03/01/2019  . Hematuria 03/01/2019  . Nephrolithiasis 03/01/2019  . Cholelithiasis 03/01/2019  . Secondary hypercoagulable state ([place]) 12/23/2018  . Atrial fibrillation ([place]) 10/14/2018  . Inability to walk 10/19/2017  . Hip pain 10/19/2017  . Left hip pain 10/19/2017  . Protein-calorie malnutrition, severe 11/01/2014  . Lower GI bleed 10/31/2014  . Colitis 10/31/2014  . History of DVT of lower extremity 10/31/2014  . Sleep apnea   . Hypertension    Past Medical History:  Diagnosis Date  . Arrhythmia   . Arthritis    hands shoulders, hips  . BMI 40.0-44.9, adult (HCC)    Greater than 40 BMI  . 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 Xarelto - no longer taking  . Dysrhythmia    Atrial Fib x1 episode, cardioversion 4'16- NSR  . Fracture of trochanter of right femur ([place]) 02/2019  . History of kidney stones    x1 -20 yrs ago  . Hypertension   . Peripheral vascular disease ([place])    DVT 2014  . Ulcerative colitis ([place])    history of , some current issues at this time    Past Surgical History:  Procedure Laterality Date  . CARDIOVERSION     tx Paroxysmal Atrial Fibrillation- converted to NSR  . CARDIOVERSION N/A 11/06/2018   Procedure: CARDIOVERSION;  Surgeon: Donato Heinz, MD;  Location: [facility] ENDOSCOPY;  Service: Endoscopy;  Laterality: N/A;  . COLONOSCOPY W/ POLYPECTOMY    . EYE SURGERY Right 2016  . FEMUR SURGERY Left    ORIF 10'05  . FLEXIBLE SIGMOIDOSCOPY N/A 11/01/2014   Procedure: FLEXIBLE SIGMOIDOSCOPY;  Surgeon: Garlan Fair, MD;  Location: [facility] ENDOSCOPY;  Service: Endoscopy;  Laterality: N/A;  . JOINT REPLACEMENT     BTHA    No current facility-administered medications for this encounter.   Current Outpatient Medications  Medication Sig Dispense Refill Last Dose  . acetaminophen (TYLENOL) 500 MG tablet Take 1,000-1,500 mg by mouth 2 (two) times  daily.      Marland Kitchen apixaban (ELIQUIS) 5 MG TABS tablet Take 1 tablet (5 mg total) by mouth 2 (two) times daily. 60 tablet 6   . Cholecalciferol (VITAMIN D3) 50 MCG (2000 UT) capsule Take 2,000 Units by mouth daily.     . mesalamine (LIALDA) 1.2 g EC tablet Take 2.4 g by mouth daily.     Marland Kitchen diltiazem (CARDIZEM CD) 300 MG 24 hr capsule TAKE 1 CAPSULE(300 MG) BY MOUTH DAILY 30 capsule 3   . diltiazem (CARDIZEM CD) 300 MG 24 hr capsule TAKE 1 CAPSULE(300 MG) BY MOUTH DAILY 30 capsule 3    Allergies  Allergen Reactions  . Sulfa Antibiotics Other (See Comments)    Childhood allergy    Social History   Tobacco Use  . Smoking status: Never Smoker  . Smokeless tobacco: Never Used  Substance Use Topics  . Alcohol use: Yes    Alcohol/week: 3.0 standard drinks    Types: 1 Glasses of wine, 2 Cans of beer per week    Comment: weekend    Family History  Problem Relation Age of Onset  . Lung cancer Brother       Review of Systems  Constitutional: Negative.   HENT: Negative.   Eyes: Negative.   Respiratory: Negative.   Cardiovascular: Negative.   Gastrointestinal: Negative.   Genitourinary: Positive for frequency.  Musculoskeletal: Positive for joint pain.  Skin: Negative.   Neurological: Negative.   Endo/Heme/Allergies: Negative.   Psychiatric/Behavioral: Negative.       Objective:  Physical Exam Constitutional:      Appearance: He is well-developed.  HENT:     Head: Normocephalic.  Eyes:     Pupils: Pupils are equal, round, and reactive to light.  Neck:     Thyroid: No thyromegaly.     Vascular: No JVD.     Trachea: No tracheal deviation.  Pulmonary:     Effort: Pulmonary effort is normal. No respiratory distress.     Breath sounds: Normal breath sounds. No wheezing.  Abdominal:     Palpations: Abdomen is soft.     Tenderness: There is no abdominal tenderness. There is no guarding.  Musculoskeletal:     Cervical back: Neck supple.     Right hip: Tenderness and bony  tenderness present. Decreased range of motion. Decreased strength.  Lymphadenopathy:     Cervical: No cervical adenopathy.  Skin:    General: Skin is warm and dry.  Neurological:     Mental Status: He is alert and oriented to person, place, and time.     Sensory: Sensory deficit (numbness/tingling in the hands) present.        Labs:  Estimated body mass index is 32.41 kg/m as calculated from the following:   Height as of 08/09/19: 6' (1.829 m).   Weight as of 08/09/19: 108.4  kg.  Imaging Review:  Plain radiographs demonstrate failure of the right THA. There is evidence of loosening of the femoral stem.The bone quality appears to be good for age and reported activity level.     Assessment/Plan:  Right hip with failed previous arthroplasty.  The patient history, physical examination, clinical judgement of the provider and imaging studies are consistent with failure of the right hip(s), previous total hip arthroplasty. Revision total hip arthroplasty is deemed medically necessary. The treatment options including medical management, injection therapy, arthroscopy and arthroplasty were discussed at length. The risks and benefits of total hip arthroplasty were presented and reviewed. The risks due to aseptic loosening, infection, stiffness, dislocation/subluxation,  thromboembolic complications and other imponderables were discussed.  The patient acknowledged the explanation, agreed to proceed with the plan and consent was signed. Patient is being admitted for treatment for surgery, pain control, PT, OT, prophylactic antibiotics, VTE prophylaxis, progressive ambulation and ADL's and discharge planning. The patient is planning to be discharged home .     Steven Pugh Rayquon Uselman   PA-C  08/16/2019, 9:36 PM

## 2019-08-19 ENCOUNTER — Encounter (HOSPITAL_COMMUNITY): Payer: Self-pay | Admitting: Orthopedic Surgery

## 2019-08-19 ENCOUNTER — Other Ambulatory Visit: Payer: Self-pay

## 2019-08-19 ENCOUNTER — Inpatient Hospital Stay (HOSPITAL_COMMUNITY): Payer: No Typology Code available for payment source | Admitting: Anesthesiology

## 2019-08-19 ENCOUNTER — Encounter (HOSPITAL_COMMUNITY)
Admission: RE | Disposition: A | Discharge status: Home / Self Care | Payer: Self-pay | Source: Home / Self Care | Attending: Orthopedic Surgery

## 2019-08-19 ENCOUNTER — Inpatient Hospital Stay (HOSPITAL_COMMUNITY): Payer: No Typology Code available for payment source

## 2019-08-19 ENCOUNTER — Inpatient Hospital Stay (HOSPITAL_COMMUNITY): Payer: No Typology Code available for payment source | Admitting: Physician Assistant

## 2019-08-19 ENCOUNTER — Inpatient Hospital Stay (HOSPITAL_COMMUNITY)
Admission: RE | Admit: 2019-08-19 | Discharge: 2019-08-23 | DRG: 468 | Disposition: A | Discharge status: Home / Self Care | Payer: No Typology Code available for payment source | Attending: Orthopedic Surgery | Admitting: Orthopedic Surgery

## 2019-08-19 DIAGNOSIS — Z882 Allergy status to sulfonamides status: Secondary | ICD-10-CM

## 2019-08-19 DIAGNOSIS — E669 Obesity, unspecified: Secondary | ICD-10-CM

## 2019-08-19 DIAGNOSIS — Y792 Prosthetic and other implants, materials and accessory orthopedic devices associated with adverse incidents: Secondary | ICD-10-CM | POA: Diagnosis present

## 2019-08-19 DIAGNOSIS — I480 Paroxysmal atrial fibrillation: Secondary | ICD-10-CM | POA: Diagnosis present

## 2019-08-19 DIAGNOSIS — T84090A Other mechanical complication of internal right hip prosthesis, initial encounter: Principal | ICD-10-CM | POA: Diagnosis present

## 2019-08-19 DIAGNOSIS — Z96649 Presence of unspecified artificial hip joint: Secondary | ICD-10-CM

## 2019-08-19 DIAGNOSIS — G4730 Sleep apnea, unspecified: Secondary | ICD-10-CM | POA: Diagnosis present

## 2019-08-19 DIAGNOSIS — Z6832 Body mass index (BMI) 32.0-32.9, adult: Secondary | ICD-10-CM

## 2019-08-19 DIAGNOSIS — Z86718 Personal history of other venous thrombosis and embolism: Secondary | ICD-10-CM | POA: Diagnosis not present

## 2019-08-19 DIAGNOSIS — M1990 Unspecified osteoarthritis, unspecified site: Secondary | ICD-10-CM | POA: Diagnosis present

## 2019-08-19 DIAGNOSIS — I739 Peripheral vascular disease, unspecified: Secondary | ICD-10-CM | POA: Diagnosis present

## 2019-08-19 DIAGNOSIS — Z79899 Other long term (current) drug therapy: Secondary | ICD-10-CM

## 2019-08-19 DIAGNOSIS — I10 Essential (primary) hypertension: Secondary | ICD-10-CM | POA: Diagnosis present

## 2019-08-19 DIAGNOSIS — Z87442 Personal history of urinary calculi: Secondary | ICD-10-CM

## 2019-08-19 DIAGNOSIS — Z801 Family history of malignant neoplasm of trachea, bronchus and lung: Secondary | ICD-10-CM

## 2019-08-19 DIAGNOSIS — Z20822 Contact with and (suspected) exposure to covid-19: Secondary | ICD-10-CM | POA: Diagnosis present

## 2019-08-19 DIAGNOSIS — Z96641 Presence of right artificial hip joint: Secondary | ICD-10-CM

## 2019-08-19 DIAGNOSIS — Z7901 Long term (current) use of anticoagulants: Secondary | ICD-10-CM

## 2019-08-19 HISTORY — PX: TOTAL HIP REVISION: SHX763

## 2019-08-19 LAB — TYPE AND SCREEN
ABO/RH(D): O POS
Antibody Screen: NEGATIVE

## 2019-08-19 SURGERY — TOTAL HIP REVISION
Anesthesia: Spinal | Site: Hip | Laterality: Right

## 2019-08-19 MED ORDER — TRANEXAMIC ACID-NACL 1000-0.7 MG/100ML-% IV SOLN
1000.0000 mg | INTRAVENOUS | Status: AC
  Administered 2019-08-19: 1000 mg via INTRAVENOUS
  Filled 2019-08-19: qty 100

## 2019-08-19 MED ORDER — ALUM & MAG HYDROXIDE-SIMETH 200-200-20 MG/5ML PO SUSP: 15.0000 mL | ORAL | Status: DC | PRN

## 2019-08-19 MED ORDER — METOCLOPRAMIDE HCL 5 MG PO TABS: 5.0000 mg | ORAL_TABLET | Freq: Three times a day (TID) | ORAL | Status: DC | PRN

## 2019-08-19 MED ORDER — PHENYLEPHRINE 40 MCG/ML (10ML) SYRINGE FOR IV PUSH (FOR BLOOD PRESSURE SUPPORT)
PREFILLED_SYRINGE | INTRAVENOUS | Status: AC
  Filled 2019-08-19: qty 10

## 2019-08-19 MED ORDER — DILTIAZEM HCL ER COATED BEADS 180 MG PO CP24
300.0000 mg | ORAL_CAPSULE | Freq: Every day | ORAL | Status: DC
  Administered 2019-08-20 – 2019-08-23 (×4): 300 mg via ORAL
  Filled 2019-08-19 (×4): qty 1

## 2019-08-19 MED ORDER — METHOCARBAMOL 500 MG PO TABS
500.0000 mg | ORAL_TABLET | Freq: Four times a day (QID) | ORAL | Status: DC | PRN
  Administered 2019-08-19 – 2019-08-23 (×8): 500 mg via ORAL
  Filled 2019-08-19 (×8): qty 1

## 2019-08-19 MED ORDER — PROPOFOL 10 MG/ML IV BOLUS
INTRAVENOUS | Status: AC
  Filled 2019-08-19: qty 20

## 2019-08-19 MED ORDER — APIXABAN 2.5 MG PO TABS
2.5000 mg | ORAL_TABLET | Freq: Two times a day (BID) | ORAL | Status: DC
  Administered 2019-08-20 – 2019-08-23 (×7): 2.5 mg via ORAL
  Filled 2019-08-19 (×7): qty 1

## 2019-08-19 MED ORDER — FENTANYL CITRATE (PF) 250 MCG/5ML IJ SOLN
INTRAMUSCULAR | Status: AC
  Filled 2019-08-19: qty 5

## 2019-08-19 MED ORDER — PHENYLEPHRINE HCL (PRESSORS) 10 MG/ML IV SOLN
INTRAVENOUS | Status: AC
  Filled 2019-08-19: qty 1

## 2019-08-19 MED ORDER — FENTANYL CITRATE (PF) 100 MCG/2ML IJ SOLN
INTRAMUSCULAR | Status: AC
  Filled 2019-08-19: qty 2

## 2019-08-19 MED ORDER — MORPHINE SULFATE (PF) 2 MG/ML IV SOLN
0.5000 mg | INTRAVENOUS | Status: DC | PRN
  Administered 2019-08-19 – 2019-08-20 (×3): 1 mg via INTRAVENOUS
  Filled 2019-08-19 (×3): qty 1

## 2019-08-19 MED ORDER — SODIUM CHLORIDE 0.9 % IV SOLN: INTRAVENOUS | Status: DC

## 2019-08-19 MED ORDER — FENTANYL CITRATE (PF) 100 MCG/2ML IJ SOLN: 25.0000 ug | INTRAMUSCULAR | Status: DC | PRN

## 2019-08-19 MED ORDER — FERROUS SULFATE 325 (65 FE) MG PO TABS
325.0000 mg | ORAL_TABLET | Freq: Three times a day (TID) | ORAL | Status: DC
  Administered 2019-08-19 – 2019-08-23 (×11): 325 mg via ORAL
  Filled 2019-08-19 (×11): qty 1

## 2019-08-19 MED ORDER — HYDROCODONE-ACETAMINOPHEN 7.5-325 MG PO TABS
1.0000 | ORAL_TABLET | ORAL | Status: DC | PRN
  Filled 2019-08-19: qty 2

## 2019-08-19 MED ORDER — SODIUM CHLORIDE 0.9 % IR SOLN
Status: DC | PRN
  Administered 2019-08-19: 3000 mL

## 2019-08-19 MED ORDER — PROPOFOL 1000 MG/100ML IV EMUL
INTRAVENOUS | Status: AC
  Filled 2019-08-19: qty 200

## 2019-08-19 MED ORDER — ONDANSETRON HCL 4 MG/2ML IJ SOLN
INTRAMUSCULAR | Status: DC | PRN
  Administered 2019-08-19: 4 mg via INTRAVENOUS

## 2019-08-19 MED ORDER — PROPOFOL 10 MG/ML IV BOLUS
INTRAVENOUS | Status: DC | PRN
  Administered 2019-08-19 (×3): 20 mg via INTRAVENOUS

## 2019-08-19 MED ORDER — CEFAZOLIN SODIUM-DEXTROSE 2-4 GM/100ML-% IV SOLN
2.0000 g | INTRAVENOUS | Status: AC
  Administered 2019-08-19: 2 g via INTRAVENOUS
  Filled 2019-08-19: qty 100

## 2019-08-19 MED ORDER — DEXAMETHASONE SODIUM PHOSPHATE 10 MG/ML IJ SOLN
INTRAMUSCULAR | Status: AC
  Filled 2019-08-19: qty 2

## 2019-08-19 MED ORDER — ONDANSETRON HCL 4 MG/2ML IJ SOLN
INTRAMUSCULAR | Status: AC
  Filled 2019-08-19: qty 4

## 2019-08-19 MED ORDER — ROCURONIUM BROMIDE 10 MG/ML (PF) SYRINGE
PREFILLED_SYRINGE | INTRAVENOUS | Status: AC
  Filled 2019-08-19: qty 10

## 2019-08-19 MED ORDER — LIDOCAINE 2% (20 MG/ML) 5 ML SYRINGE
INTRAMUSCULAR | Status: DC | PRN
  Administered 2019-08-19: 40 mg via INTRAVENOUS

## 2019-08-19 MED ORDER — METHOCARBAMOL 500 MG IVPB - SIMPLE MED
500.0000 mg | Freq: Four times a day (QID) | INTRAVENOUS | Status: DC | PRN
  Administered 2019-08-19: 500 mg via INTRAVENOUS
  Filled 2019-08-19: qty 50

## 2019-08-19 MED ORDER — CHLORHEXIDINE GLUCONATE 0.12 % MT SOLN
15.0000 mL | Freq: Once | OROMUCOSAL | Status: AC
  Administered 2019-08-19: 15 mL via OROMUCOSAL

## 2019-08-19 MED ORDER — ONDANSETRON HCL 4 MG PO TABS: 4.0000 mg | ORAL_TABLET | Freq: Four times a day (QID) | ORAL | Status: DC | PRN

## 2019-08-19 MED ORDER — ONDANSETRON HCL 4 MG/2ML IJ SOLN: 4.0000 mg | Freq: Once | INTRAMUSCULAR | Status: DC | PRN

## 2019-08-19 MED ORDER — DOCUSATE SODIUM 100 MG PO CAPS
100.0000 mg | ORAL_CAPSULE | Freq: Two times a day (BID) | ORAL | Status: DC
  Administered 2019-08-19 – 2019-08-21 (×3): 100 mg via ORAL
  Filled 2019-08-19 (×8): qty 1

## 2019-08-19 MED ORDER — METOCLOPRAMIDE HCL 5 MG/ML IJ SOLN: 5.0000 mg | Freq: Three times a day (TID) | INTRAMUSCULAR | Status: DC | PRN

## 2019-08-19 MED ORDER — METHOCARBAMOL 500 MG IVPB - SIMPLE MED
INTRAVENOUS | Status: AC
  Filled 2019-08-19: qty 50

## 2019-08-19 MED ORDER — DEXAMETHASONE SODIUM PHOSPHATE 10 MG/ML IJ SOLN
10.0000 mg | Freq: Once | INTRAMUSCULAR | Status: AC
  Administered 2019-08-20: 10 mg via INTRAVENOUS
  Filled 2019-08-19: qty 1

## 2019-08-19 MED ORDER — PROPOFOL 500 MG/50ML IV EMUL
INTRAVENOUS | Status: DC | PRN
  Administered 2019-08-19: 50 ug/kg/min via INTRAVENOUS

## 2019-08-19 MED ORDER — LACTATED RINGERS IV SOLN: INTRAVENOUS | Status: DC

## 2019-08-19 MED ORDER — POLYETHYLENE GLYCOL 3350 17 G PO PACK
17.0000 g | PACK | Freq: Two times a day (BID) | ORAL | Status: DC
  Administered 2019-08-19 – 2019-08-22 (×3): 17 g via ORAL
  Filled 2019-08-19 (×9): qty 1

## 2019-08-19 MED ORDER — HYDROCODONE-ACETAMINOPHEN 5-325 MG PO TABS
1.0000 | ORAL_TABLET | ORAL | Status: DC | PRN
  Administered 2019-08-19: 2 via ORAL
  Administered 2019-08-19: 1 via ORAL
  Administered 2019-08-20 – 2019-08-23 (×15): 2 via ORAL
  Filled 2019-08-19 (×10): qty 2
  Filled 2019-08-19: qty 1
  Filled 2019-08-19 (×6): qty 2

## 2019-08-19 MED ORDER — DEXAMETHASONE SODIUM PHOSPHATE 10 MG/ML IJ SOLN
10.0000 mg | Freq: Once | INTRAMUSCULAR | Status: AC
  Administered 2019-08-19: 10 mg via INTRAVENOUS

## 2019-08-19 MED ORDER — MAGNESIUM CITRATE PO SOLN: 1.0000 | Freq: Once | ORAL | Status: DC | PRN

## 2019-08-19 MED ORDER — FENTANYL CITRATE (PF) 250 MCG/5ML IJ SOLN
INTRAMUSCULAR | Status: DC | PRN
  Administered 2019-08-19 (×5): 50 ug via INTRAVENOUS

## 2019-08-19 MED ORDER — PHENYLEPHRINE HCL-NACL 10-0.9 MG/250ML-% IV SOLN
INTRAVENOUS | Status: DC | PRN
Start: 2019-08-19 — End: 2019-08-19
  Administered 2019-08-19: 50 ug/min via INTRAVENOUS

## 2019-08-19 MED ORDER — ORAL CARE MOUTH RINSE: 15.0000 mL | Freq: Once | OROMUCOSAL | Status: AC

## 2019-08-19 MED ORDER — MIDAZOLAM HCL 2 MG/2ML IJ SOLN
INTRAMUSCULAR | Status: DC | PRN
  Administered 2019-08-19 (×2): 1 mg via INTRAVENOUS

## 2019-08-19 MED ORDER — CEFAZOLIN SODIUM-DEXTROSE 2-4 GM/100ML-% IV SOLN
2.0000 g | Freq: Four times a day (QID) | INTRAVENOUS | Status: AC
  Administered 2019-08-19 (×2): 2 g via INTRAVENOUS
  Filled 2019-08-19 (×2): qty 100

## 2019-08-19 MED ORDER — LIDOCAINE 2% (20 MG/ML) 5 ML SYRINGE
INTRAMUSCULAR | Status: AC
  Filled 2019-08-19: qty 25

## 2019-08-19 MED ORDER — EPHEDRINE 5 MG/ML INJ
INTRAVENOUS | Status: AC
  Filled 2019-08-19: qty 10

## 2019-08-19 MED ORDER — PHENOL 1.4 % MT LIQD: 1.0000 | OROMUCOSAL | Status: DC | PRN

## 2019-08-19 MED ORDER — MESALAMINE 1.2 G PO TBEC
2.4000 g | DELAYED_RELEASE_TABLET | Freq: Every day | ORAL | Status: DC
  Administered 2019-08-20 – 2019-08-23 (×4): 2.4 g via ORAL
  Filled 2019-08-19 (×4): qty 2

## 2019-08-19 MED ORDER — BUPIVACAINE IN DEXTROSE 0.75-8.25 % IT SOLN
INTRATHECAL | Status: DC | PRN
Start: 2019-08-19 — End: 2019-08-19
  Administered 2019-08-19: 2 mL via INTRATHECAL

## 2019-08-19 MED ORDER — BISACODYL 10 MG RE SUPP: 10.0000 mg | Freq: Every day | RECTAL | Status: DC | PRN

## 2019-08-19 MED ORDER — TRANEXAMIC ACID-NACL 1000-0.7 MG/100ML-% IV SOLN
1000.0000 mg | Freq: Once | INTRAVENOUS | Status: AC
  Administered 2019-08-19: 1000 mg via INTRAVENOUS
  Filled 2019-08-19: qty 100

## 2019-08-19 MED ORDER — ONDANSETRON HCL 4 MG/2ML IJ SOLN: 4.0000 mg | Freq: Four times a day (QID) | INTRAMUSCULAR | Status: DC | PRN

## 2019-08-19 MED ORDER — ACETAMINOPHEN 500 MG PO TABS
1000.0000 mg | ORAL_TABLET | Freq: Once | ORAL | Status: AC
  Administered 2019-08-19: 1000 mg via ORAL
  Filled 2019-08-19: qty 2

## 2019-08-19 MED ORDER — MENTHOL 3 MG MT LOZG: 1.0000 | LOZENGE | OROMUCOSAL | Status: DC | PRN

## 2019-08-19 MED ORDER — DIPHENHYDRAMINE HCL 12.5 MG/5ML PO ELIX: 12.5000 mg | ORAL_SOLUTION | ORAL | Status: DC | PRN

## 2019-08-19 MED ORDER — ACETAMINOPHEN 325 MG PO TABS
325.0000 mg | ORAL_TABLET | Freq: Four times a day (QID) | ORAL | Status: DC | PRN
  Filled 2019-08-19: qty 2

## 2019-08-19 MED ORDER — MIDAZOLAM HCL 2 MG/2ML IJ SOLN
INTRAMUSCULAR | Status: AC
  Filled 2019-08-19: qty 2

## 2019-08-19 SURGICAL SUPPLY — 72 items
BAG DECANTER FOR FLEXI CONT (MISCELLANEOUS) IMPLANT
BAG ZIPLOCK 12X15 (MISCELLANEOUS) IMPLANT
BLADE SAW SGTL 11.0X1.19X90.0M (BLADE) IMPLANT
BLADE SAW SGTL 18X1.27X75 (BLADE) IMPLANT
BLADE SAW SGTL 18X1.27X75MM (BLADE)
BRUSH FEMORAL CANAL (MISCELLANEOUS) IMPLANT
COVER SURGICAL LIGHT HANDLE (MISCELLANEOUS) ×3 IMPLANT
COVER WAND RF STERILE (DRAPES) IMPLANT
CUP ACET PINNACLE SECTR 60MM (Hips) ×1 IMPLANT
DERMABOND ADVANCED (GAUZE/BANDAGES/DRESSINGS) ×2
DERMABOND ADVANCED .7 DNX12 (GAUZE/BANDAGES/DRESSINGS) ×1 IMPLANT
DRAPE ORTHO SPLIT 77X108 STRL (DRAPES) ×6
DRAPE POUCH INSTRU U-SHP 10X18 (DRAPES) ×3 IMPLANT
DRAPE SHEET LG 3/4 BI-LAMINATE (DRAPES) ×3 IMPLANT
DRAPE SURG 17X11 SM STRL (DRAPES) ×3 IMPLANT
DRAPE SURG ORHT 6 SPLT 77X108 (DRAPES) ×2 IMPLANT
DRAPE U-SHAPE 47X51 STRL (DRAPES) ×3 IMPLANT
DRESSING AQUACEL AG SP 3.5X10 (GAUZE/BANDAGES/DRESSINGS) IMPLANT
DRSG AQUACEL AG ADV 3.5X10 (GAUZE/BANDAGES/DRESSINGS) IMPLANT
DRSG AQUACEL AG ADV 3.5X14 (GAUZE/BANDAGES/DRESSINGS) IMPLANT
DRSG AQUACEL AG SP 3.5X10 (GAUZE/BANDAGES/DRESSINGS)
DURAPREP 26ML APPLICATOR (WOUND CARE) ×6 IMPLANT
ELECT BLADE TIP CTD 4 INCH (ELECTRODE) ×3 IMPLANT
ELECT REM PT RETURN 15FT ADLT (MISCELLANEOUS) ×3 IMPLANT
ELIMINATOR HOLE APEX DEPUY (Hips) ×3 IMPLANT
FACESHIELD WRAPAROUND (MASK) ×12 IMPLANT
GAUZE SPONGE 2X2 8PLY STRL LF (GAUZE/BANDAGES/DRESSINGS) ×1 IMPLANT
GLOVE BIOGEL M 7.0 STRL (GLOVE) ×30 IMPLANT
GLOVE BIOGEL PI IND STRL 7.5 (GLOVE) ×1 IMPLANT
GLOVE BIOGEL PI IND STRL 8.5 (GLOVE) ×1 IMPLANT
GLOVE BIOGEL PI INDICATOR 7.5 (GLOVE) ×2
GLOVE BIOGEL PI INDICATOR 8.5 (GLOVE) ×2
GLOVE ECLIPSE 8.0 STRL XLNG CF (GLOVE) ×6 IMPLANT
GOWN STRL REUS W/TWL 2XL LVL3 (GOWN DISPOSABLE) ×3 IMPLANT
GOWN STRL REUS W/TWL LRG LVL3 (GOWN DISPOSABLE) ×9 IMPLANT
GRAFT IC CHAMBER LG (Bone Implant) ×6 IMPLANT
HANDPIECE INTERPULSE COAX TIP (DISPOSABLE) ×3
HEAD CERAMIC 36 PLUS5 (Hips) ×3 IMPLANT
KIT TURNOVER KIT A (KITS) ×3 IMPLANT
LINER NEUTRAL 60X36X58 P4 HIP (Liner) ×3 IMPLANT
MANIFOLD NEPTUNE II (INSTRUMENTS) ×3 IMPLANT
MARKER SKIN DUAL TIP RULER LAB (MISCELLANEOUS) ×3 IMPLANT
NDL SAFETY ECLIPSE 18X1.5 (NEEDLE) ×1 IMPLANT
NEEDLE HYPO 18GX1.5 SHARP (NEEDLE) ×3
NS IRRIG 1000ML POUR BTL (IV SOLUTION) ×6 IMPLANT
PACK TOTAL JOINT (CUSTOM PROCEDURE TRAY) ×3 IMPLANT
PADDING CAST COTTON 6X4 STRL (CAST SUPPLIES) ×3 IMPLANT
PENCIL SMOKE EVACUATOR (MISCELLANEOUS) ×3 IMPLANT
PINNSECTOR W/GRIP ACE CUP 60MM (Hips) ×3 IMPLANT
PRESSURIZER FEMORAL UNIV (MISCELLANEOUS) IMPLANT
PROTECTOR NERVE ULNAR (MISCELLANEOUS) ×3 IMPLANT
SCREW 6.5MMX25MM (Screw) ×3 IMPLANT
SCREW 6.5MMX30MM (Screw) ×3 IMPLANT
SET HNDPC FAN SPRY TIP SCT (DISPOSABLE) ×1 IMPLANT
SPONGE GAUZE 2X2 STER 10/PKG (GAUZE/BANDAGES/DRESSINGS) ×2
SPONGE LAP 18X18 RF (DISPOSABLE) ×3 IMPLANT
SPONGE LAP 4X18 RFD (DISPOSABLE) IMPLANT
STAPLER VISISTAT 35W (STAPLE) IMPLANT
STEM STR SOL 8 16.5 LRG (Stem) ×3 IMPLANT
SUCTION FRAZIER HANDLE 10FR (MISCELLANEOUS) ×6
SUCTION TUBE FRAZIER 10FR DISP (MISCELLANEOUS) ×2 IMPLANT
SUT MNCRL AB 3-0 PS2 18 (SUTURE) ×3 IMPLANT
SUT STRATAFIX PDS+ 0 24IN (SUTURE) ×3 IMPLANT
SUT VIC AB 1 CT1 36 (SUTURE) ×3 IMPLANT
SUT VIC AB 2-0 CT1 27 (SUTURE) ×9
SUT VIC AB 2-0 CT1 TAPERPNT 27 (SUTURE) ×3 IMPLANT
TOWEL OR 17X26 10 PK STRL BLUE (TOWEL DISPOSABLE) ×6 IMPLANT
TOWER CARTRIDGE SMART MIX (DISPOSABLE) IMPLANT
TRAY FOLEY MTR SLVR 16FR STAT (SET/KITS/TRAYS/PACK) ×3 IMPLANT
TUBE KAMVAC SUCTION (TUBING) IMPLANT
WATER STERILE IRR 1000ML POUR (IV SOLUTION) ×6 IMPLANT
YANKAUER SUCT BULB TIP 10FT TU (MISCELLANEOUS) ×3 IMPLANT

## 2019-08-19 NOTE — Anesthesia Postprocedure Evaluation (Signed)
Anesthesia Post Note  Patient: VANDER KUEKER  Procedure(s) Performed: TOTAL HIP REVISION (Right Hip)     Patient location during evaluation: PACU Anesthesia Type: Spinal Level of consciousness: oriented, awake and alert and awake Pain management: pain level controlled Vital Signs Assessment: post-procedure vital signs reviewed and stable Respiratory status: spontaneous breathing, respiratory function stable and patient connected to nasal cannula oxygen Cardiovascular status: blood pressure returned to baseline and stable Postop Assessment: no headache, no backache, no apparent nausea or vomiting, spinal receding and patient able to bend at knees Anesthetic complications: no   No complications documented.  Last Vitals:  Vitals:   08/19/19 1245 08/19/19 1302  BP: (!) 97/50 123/69  Pulse: 75 78  Resp: 20 14  Temp: 36.4 C 36.5 C  SpO2: 100% 100%    Last Pain:  Vitals:   08/19/19 1304  TempSrc:   PainSc: [place]

## 2019-08-19 NOTE — Transfer of Care (Signed)
Immediate Anesthesia Transfer of Care Note  Patient: Steven Blair  Procedure(s) Performed: TOTAL HIP REVISION (Right Hip)  Patient Location: PACU  Anesthesia Type:Spinal  Level of Consciousness: awake and alert   Airway & Oxygen Therapy: Patient Spontanous Breathing and Patient connected to face mask oxygen  Post-op Assessment: Report given to RN and Post -op Vital signs reviewed and stable  Post vital signs: Reviewed and stable  Last Vitals:  Vitals Value Taken Time  BP 123/75 08/19/19 1131  Temp    Pulse 80 08/19/19 1137  Resp 13 08/19/19 1137  SpO2 100 % 08/19/19 1137  Vitals shown include unvalidated device data.  Last Pain:  Vitals:   08/19/19 0637  TempSrc: Oral      Patients Stated Pain Goal: 4 (53/97/67 3419)  Complications: No complications documented.

## 2019-08-19 NOTE — Anesthesia Procedure Notes (Signed)
Date/Time: 08/19/2019 8:38 AM Performed by: Cynda Familia, CRNA Pre-anesthesia Checklist: Patient identified, Emergency Drugs available, Suction available, Patient being monitored and Timeout performed Patient Re-evaluated:Patient Re-evaluated prior to induction Oxygen Delivery Method: Simple face mask Placement Confirmation: positive ETCO2 and breath sounds checked- equal and bilateral Dental Injury: Teeth and Oropharynx as per pre-operative assessment

## 2019-08-19 NOTE — Evaluation (Signed)
Physical Therapy Evaluation Patient Details Name: Steven Blair MRN: [id number] DOB: 07-02-1945 Today's Date: 08/19/2019   History of Present Illness  Patient is 74 y.o. male s/p Rt THR on 08/19/19 with PMH significant for PVD, HTN, OA, DVT, prior Bil THA.  Clinical Impression  Steven Blair is a 74 y.o. male POD 0 s/p Rt THR. Patient reports independence with RW for mobility prior to surgery. Patient is now limited by functional impairments (see PT problem list below) and requires min assist for transfers and gait with RW. Patient was able to ambulate ~8 feet with RW and min assist. Patient instructed in exercise to facilitate circulation and educated on posterior hip precautions. Patient will benefit from continued skilled PT interventions to address impairments and progress towards PLOF. Acute PT will follow to progress mobility and stair training in preparation for safe discharge home.     Follow Up Recommendations Follow surgeon's recommendation for DC plan and follow-up therapies;Outpatient PT    Equipment Recommendations  None recommended by PT    Recommendations for Other Services       Precautions / Restrictions Precautions Precautions: Fall;Posterior Hip Precaution Booklet Issued: Yes (comment) Restrictions Weight Bearing Restrictions: Yes RLE Weight Bearing: Partial weight bearing RLE Partial Weight Bearing Percentage or Pounds: 50%      Mobility  Bed Mobility Overal bed mobility: Needs Assistance Bed Mobility: Supine to Sit     Supine to sit: Min assist;HOB elevated     General bed mobility comments: VC's to maintain posterior hip precautions. assist to raise trunk upright and bring LE's fully off EOB.  Transfers Overall transfer level: Needs assistance Equipment used: Rolling walker (2 wheeled) Transfers: Sit to/from Stand Sit to Stand: Min assist;From elevated surface         General transfer comment: Cues to maintain posterior hip precautions by  extending Rt LE and cues to fokllow 50% WB precaution in standing.   Ambulation/Gait Ambulation/Gait assistance: Min assist Gait Distance (Feet): 8 Feet Assistive device: Rolling walker (2 wheeled) Gait Pattern/deviations: Step-to pattern;Decreased step length - left;Decreased stance time - right;Decreased weight shift to right;Decreased stride length;Antalgic Gait velocity: decr   General Gait Details: VC's for safe step pattern, proximity to RW, and for PWB status of 50%. pt limited this date by pain.  Stairs            Wheelchair Mobility    Modified Rankin (Stroke Patients Only)       Balance Overall balance assessment: Needs assistance Sitting-balance support: Feet supported;Bilateral upper extremity supported Sitting balance-Leahy Scale: Good     Standing balance support: During functional activity Standing balance-Leahy Scale: Poor            Pertinent Vitals/Pain Pain Assessment: 0-10 Pain Score: 6  Pain Location: Rt hip posteriorly Pain Descriptors / Indicators: Aching;Discomfort Pain Intervention(s): Monitored during session;Limited activity within patient's tolerance;Repositioned;RN gave pain meds during session;Ice applied    Home Living Family/patient expects to be discharged to:: Private residence Living Arrangements: Spouse/significant other Available Help at Discharge: Family Type of Home: House Home Access: Stairs to enter Entrance Stairs-Rails: None Entrance Stairs-Number of Steps: 1 Home Layout: Two level;Bed/bath upstairs Home Equipment: Walker - 2 wheels;Bedside commode;Cane - single point;Cane - quad      Prior Function Level of Independence: Independent;Independent with assistive device(s)         Comments: pt using RW     Hand Dominance   Dominant Hand: Right    Extremity/Trunk Assessment   Upper Extremity  Assessment Upper Extremity Assessment: Overall WFL for tasks assessed    Lower Extremity Assessment Lower  Extremity Assessment: RLE deficits/detail RLE Deficits / Details: good quad activation, limited testing due to pain and precautions RLE: Unable to fully assess due to pain RLE Sensation: WNL (pt reports some lingering tingling in feet/toes) RLE Coordination: WNL    Cervical / Trunk Assessment Cervical / Trunk Assessment: Normal  Communication   Communication: No difficulties  Cognition Arousal/Alertness: Awake/alert Behavior During Therapy: WFL for tasks assessed/performed Overall Cognitive Status: Within Functional Limits for tasks assessed         General Comments      Exercises Total Joint Exercises Ankle Circles/Pumps: AROM;Both;20 reps;Seated   Assessment/Plan    PT Assessment Patient needs continued PT services  PT Problem List Decreased strength;Decreased range of motion;Decreased activity tolerance;Decreased balance;Decreased mobility;Decreased knowledge of use of DME;Pain       PT Treatment Interventions Gait training;DME instruction;Stair training;Functional mobility training;Therapeutic exercise;Therapeutic activities;Balance training;Patient/family education    PT Goals (Current goals can be found in the Care Plan section)  Acute Rehab PT Goals Patient Stated Goal: to get back to walking and swimming for exercise without hip pain PT Goal Formulation: With patient Time For Goal Achievement: 08/26/19 Potential to Achieve Goals: Good    Frequency 7X/week    AM-PAC PT "6 Clicks" Mobility  Outcome Measure Help needed turning from your back to your side while in a flat bed without using bedrails?: A Little Help needed moving from lying on your back to sitting on the side of a flat bed without using bedrails?: A Little Help needed moving to and from a bed to a chair (including a wheelchair)?: A Little Help needed standing up from a chair using your arms (e.g., wheelchair or bedside chair)?: A Little Help needed to walk in hospital room?: A Little Help needed  climbing 3-5 steps with a railing? : A Lot 6 Click Score: 17    End of Session Equipment Utilized During Treatment: Gait belt Activity Tolerance: Patient tolerated treatment well Patient left: in chair;with call bell/phone within reach;with chair alarm set;with family/visitor present Nurse Communication: Mobility status PT Visit Diagnosis: Muscle weakness (generalized) (M62.81);Difficulty in walking, not elsewhere classified (R26.2)    Time: 2248-2500 PT Time Calculation (min) (ACUTE ONLY): 30 min   Charges:   PT Evaluation $PT Eval Low Complexity: 1 Low PT Treatments $Therapeutic Activity: 8-22 mins        Verner Mould, DPT Acute Rehabilitation Services  Office [phone] Pager [phone]  08/19/2019 4:29 PM

## 2019-08-19 NOTE — Anesthesia Procedure Notes (Signed)
Spinal  Patient location during procedure: OR Start time: 08/19/2019 8:47 AM End time: 08/19/2019 8:57 AM Staffing Performed: anesthesiologist  Anesthesiologist: Catalina Gravel, MD Preanesthetic Checklist Completed: patient identified, IV checked, risks and benefits discussed, surgical consent, monitors and equipment checked, pre-op evaluation and timeout performed Spinal Block Patient position: sitting Prep: DuraPrep and site prepped and draped Patient monitoring: continuous pulse ox and blood pressure Approach: midline Location: L3-4 Injection technique: single-shot Needle Needle type: Quincke  Needle gauge: 22 G Assessment Sensory level: T8 Additional Notes Functioning IV was confirmed and monitors were applied. Sterile prep and drape, including hand hygiene, mask and sterile gloves were used. The patient was positioned and the spine was prepped. The skin was anesthetized with lidocaine.  Free flow of clear CSF was obtained prior to injecting local anesthetic into the CSF.  The spinal needle aspirated freely following injection.  The needle was carefully withdrawn.  The patient tolerated the procedure well. Consent was obtained prior to procedure with all questions answered and concerns addressed. Risks including but not limited to bleeding, infection, nerve damage, paralysis, failed block, inadequate analgesia, allergic reaction, high spinal, itching and headache were discussed and the patient wished to proceed.   Attempt x2 by CRNA, unsuccessful.  Attempt x1 by MDA wit 24ga Pencan, attempt x1 with 22ga Quinke with CSF return.  Hoy Morn, MD

## 2019-08-19 NOTE — Op Note (Signed)
NAME: Steven Blair, Steven Blair MEDICAL RECORD [id number] ACCOUNT [id number] DATE OF BIRTH:[birth year] FACILITY: [facility] LOCATION: [facility] PHYSICIAN:Kwali Wrinkle D. 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  PROCEDURE:  Revision right total hip arthroplasty.  COMPONENTS USED:  DePuy revision hip system with a size 60 mm Gription Sector Pinnacle shell with a 30 6+4 10-degree face changing Ultrex liner.  Two cancellous screws in the ilium. Femoral component was a size 16.5 large stature Solution 8 inch stem with a 36+5 delta ceramic ball.  SURGEON:  Paralee Cancel, MD  ASSISTANT:  Danae Orleans, PA-C.  ANESTHESIA:  Spinal.  SPECIMENS:  None.  ESTIMATED BLOOD LOSS:  300 mL.  DRAINS:  None.  COMPLICATIONS:  None apparent.  INDICATIONS  FOR PROCEDURE:  The patient is a 74 year old male with longstanding history of right total hip arthroplasty.  We have been following his hip for some time related to polyethylene wear and the orientation of his femoral stem.  He recently  began having increasing pain in his right hip.  There was some concern about a lesser trochanteric femur fracture.  However, his pain persisted.  Followup studies including bone scan indicated concern for loosening.  At this point, he was ready to  proceed with revision surgery.  The risks of infection, DVT, dislocation, neurovascular injury, need for future surgeries related to the fracture was all reviewed and consent was obtained for benefit of pain relief.  The postoperative course was reviewed  as possibilities related to the procedure performed. The possibility of a potential extended trochanteric osteotomy was reviewed and the effect on his recovery versus removing components  without challenge.  Consent was obtained for benefit of pain  relief.  DESCRIPTION OF PROCEDURE:  The patient was brought to the operative theater.  Once adequate anesthesia, preoperative antibiotics, Ancef administered, as well as tranexamic acid, he was positioned into the left lateral decubitus position with the right  hip up.  The right lower extremity was prepped and draped in sterile fashion.  Timeout was performed, identifying the patient, the planned procedure and extremity.  His old incision was identified and marked on the skin.  I used a portion of this for  posterior approach knowing that potentially we would need to extend it for an extended trochanteric osteotomy if needed.  Following skin excision, soft tissue planes were created.  A posterior approach was carried out through the iliotibial band and  gluteal fascia.  We exposed the posterior aspect of the hip, knee encountering clear synovial fluid, no purulence.  Once I exposed the posterior two-thirds of the hip through scar debridement, including the posterior aspect of the acetabulum, we were  able to dislocate the hip.  When we dislocated the hip, we identified the femoral stem was completely loose, basically swimming around in the proximal femur.  I was unable to remove the femoral ball previously placed.  This was not of concern.  Once I  further exposed the proximal femur with the hip flexed and internally rotated to 90 degrees, we used a S-ROM loop extractor and I was able to gently remove and back out the femoral component without complication.  This included the distal bullet for this  Osteonics component.  After this was removed, I debrided as much of the proximal femur as I  could with a large curette.  I then used the 8 mm reamer and focused to try to centralize the reamer into the shaft to avoid the varus position and penetrate the  lateral cortex.  I bypassed this area and then began reaming up and decided to ream up to 16 mm.   The last few millimeters were done in 0.5 mm increments with good bony fixation.  At this point, I held off on proceeding further and packed the femur off  with a sponge and attended to the acetabulum.  The acetabulum was exposed by retracting the femur anteriorly.  I debrided soft tissues around the acetabulum.  Once I had the acetabulum basically circumferentially exposed, I used the explant system with a 56 small blade with a 26 inner ball to match  what was previously placed.  I was able to remove the acetabular shell was some bone loss centrally, but not penetrating the medial wall.  We identified the central aspect of the acetabulum ____ noted and some lytic changes into the ilium and ischium and  pubis.  I began reaming with a 48 reamer and medialized to the medial wall, then reamed up to a 59 mm reamer, which I felt like I had good fixation on remaining anterior column, posterior column and ilium.  Based on the defects identified, I did then  place 30 mL of cancellous bone graft into the acetabulum, reverse reamed with a 58 mm reamer.  We selected a 60 mm Gription Pinnacle shell.  This cup was opened and impacted and had approximately 20-25 degrees of forward flexion and 35 degrees of  abduction.  I was able to place 2 cancellous screws in the ilium with excellent purchase.  At this point, I placed a neutral trial liner and returned attention to the femur.  With the femur repositioned, I began broaching with a 13.5 small stature  broach, up to the 16.5 and did a trial reduction, marking rotation as well as depth of the stem.  When we did the trial reduction, I felt that his hip was relatively stable; however, the idea of having a lip liner to prevent dislocation, revision  setting,  not going to hurt as there was no concern for impingement with external rotation and extension.  Given all these findings, I still needed to make a decision regarding the small versus large stature stem.  We dislocated  the hip, removed the  trial femoral component and at this point, selected and then placed a 36+4 10-degree face changing liner with the lip portion at the 9 o'clock position for this right hip.  It was impacted without difficulty.  Once this was done, I reevaluated the  femoral component.  With the femur flexed and internally rotated to 90, I broached up to the 16.5 large stature stem broach.  I did this because when we removed the small stature, there was still a little bit of mobility.  I felt that the proximal  fixation would help given the stability here.  I again marked the planned anteversion.  We selected the 16.5 large stature, 8 inch solution stem.  It was opened.  I then subsequently impacted using a reamer handle to match the anteversion that the broach  had had.  When it was impacted, it was well seated without apparent complication to the level where the broach sat.  At this point, we did retrial.  I retrialed with both the 1.5 and the +5 ball.  Based on the  trial reduction, I went ahead and selected  the +5 ball just to enhance stability.  This would restore his leg lengths and gluteal tension appropriately.  The final 36+5 delta ceramic ball was impacted on a clean and dried trunnion and the hip was reduced.  At this point, I reirrigated the hip as  we had irrigated throughout the case, approximately 1500 mL normal saline solution with pulse lavage.  I then reapproximated the iliotibial band and gluteal fascia using #1 Vicryl and #1 Stratafix suture.  Then the remainder of the wound was closed with  2-0 Vicryl and a running Monocryl stitch.  The hip was then cleaned, dried and dressed sterilely using surgical glue and Aquacel dressing.  He was then brought to the recovery room in stable condition, tolerating the procedure well.    Findings were reviewed with his wife.  He will be partial weightbearing for likely 6-8 weeks.  We will see him back in the office in 2 weeks after his hospital  stay.  VN/NUANCE  D:08/19/2019 T:08/19/2019 JOB:[id number]

## 2019-08-19 NOTE — Brief Op Note (Signed)
08/19/2019  8:59 AM  PATIENT:  Steven Blair  74 y.o. 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   Delay start of Pharmacological VTE agent (>24hrs) due to surgical blood loss or risk of bleeding: no

## 2019-08-19 NOTE — Interval H&P Note (Signed)
History and Physical Interval Note:  08/19/2019 7:15 AM  Steven Blair  has presented today for surgery, with the diagnosis of Failed right total hip arthroplasty.  The various methods of treatment have been discussed with the patient and family. After consideration of risks, benefits and other options for treatment, the patient has consented to  Procedure(s) with comments: TOTAL HIP REVISION (Right) - 2 hrs as a surgical intervention.  The patient's history has been reviewed, patient examined, no change in status, stable for surgery.  I have reviewed the patient's chart and labs.  Questions were answered to the patient's satisfaction.     Mauri Pole

## 2019-08-19 NOTE — Discharge Instructions (Signed)
INSTRUCTIONS AFTER JOINT REPLACEMENT   o Remove items at home which could result in a fall. This includes throw rugs or furniture in walking pathways o ICE to the affected joint every three hours while awake for 30 minutes at a time, for at least the first 3-5 days, and then as needed for pain and swelling.  Continue to use ice for pain and swelling. You may notice swelling that will progress down to the foot and ankle.  This is normal after surgery.  Elevate your leg when you are not up walking on it.   o Continue to use the breathing machine you got in the hospital (incentive spirometer) which will help keep your temperature down.  It is common for your temperature to cycle up and down following surgery, especially at night when you are not up moving around and exerting yourself.  The breathing machine keeps your lungs expanded and your temperature down.   DIET:  As you were doing prior to hospitalization, we recommend a well-balanced diet.  DRESSING / WOUND CARE / SHOWERING  Keep the surgical dressing until follow up.  The dressing is water proof, so you can shower without any extra covering.  IF THE DRESSING FALLS OFF or the wound gets wet inside, change the dressing with sterile gauze.  Please use good hand washing techniques before changing the dressing.  Do not use any lotions or creams on the incision until instructed by your surgeon.    ACTIVITY  o Increase activity slowly as tolerated, but follow the weight bearing instructions below.   o No driving for 6 weeks or until further direction given by your physician.  You cannot drive while taking narcotics.  o No lifting or carrying greater than 10 lbs. until further directed by your surgeon. o Avoid periods of inactivity such as sitting longer than an hour when not asleep. This helps prevent blood clots.  o You may return to work once you are authorized by your doctor.     WEIGHT BEARING   Partial weight bearing with assist device as  directed.  Marland Kitchen   EXERCISES  Results after joint replacement surgery are often greatly improved when you follow the exercise, range of motion and muscle strengthening exercises prescribed by your doctor. Safety measures are also important to protect the joint from further injury. Any time any of these exercises cause you to have increased pain or swelling, decrease what you are doing until you are comfortable again and then slowly increase them. If you have problems or questions, call your caregiver or physical therapist for advice.   Rehabilitation is important following a joint replacement. After just a few days of immobilization, the muscles of the leg can become weakened and shrink (atrophy).  These exercises are designed to build up the tone and strength of the thigh and leg muscles and to improve motion. Often times heat used for twenty to thirty minutes before working out will loosen up your tissues and help with improving the range of motion but do not use heat for the first two weeks following surgery (sometimes heat can increase post-operative swelling).   These exercises can be done on a training (exercise) mat, on the floor, on a table or on a bed. Use whatever works the best and is most comfortable for you.    Use music or television while you are exercising so that the exercises are a pleasant break in your day. This will make your life better with the exercises  acting as a break in your routine that you can look forward to.   Perform all exercises about fifteen times, three times per day or as directed.  You should exercise both the operative leg and the other leg as well.  Exercises include:   . Quad Sets - Tighten up the muscle on the front of the thigh (Quad) and hold for 5-10 seconds.   . Straight Leg Raises - With your knee straight (if you were given a brace, keep it on), lift the leg to 60 degrees, hold for 3 seconds, and slowly lower the leg.  Perform this exercise against  resistance later as your leg gets stronger.  . Leg Slides: Lying on your back, slowly slide your foot toward your buttocks, bending your knee up off the floor (only go as far as is comfortable). Then slowly slide your foot back down until your leg is flat on the floor again.  Glenard Haring Wings: Lying on your back spread your legs to the side as far apart as you can without causing discomfort.  . Hamstring Strength:  Lying on your back, push your heel against the floor with your leg straight by tightening up the muscles of your buttocks.  Repeat, but this time bend your knee to a comfortable angle, and push your heel against the floor.  You may put a pillow under the heel to make it more comfortable if necessary.   A rehabilitation program following joint replacement surgery can speed recovery and prevent re-injury in the future due to weakened muscles. Contact your doctor or a physical therapist for more information on knee rehabilitation.    CONSTIPATION  Constipation is defined medically as fewer than three stools per week and severe constipation as less than one stool per week.  Even if you have a regular bowel pattern at home, your normal regimen is likely to be disrupted due to multiple reasons following surgery.  Combination of anesthesia, postoperative narcotics, change in appetite and fluid intake all can affect your bowels.   YOU MUST use at least one of the following options; they are listed in order of increasing strength to get the job done.  They are all available over the counter, and you may need to use some, POSSIBLY even all of these options:    Drink plenty of fluids (prune juice may be helpful) and high fiber foods Colace 100 mg by mouth twice a day  Senokot for constipation as directed and as needed Dulcolax (bisacodyl), take with full glass of water  Miralax (polyethylene glycol) once or twice a day as needed.  If you have tried all these things and are unable to have a bowel  movement in the first 3-4 days after surgery call either your surgeon or your primary doctor.    If you experience loose stools or diarrhea, hold the medications until you stool forms back up.  If your symptoms do not get better within 1 week or if they get worse, check with your doctor.  If you experience "the worst abdominal pain ever" or develop nausea or vomiting, please contact the office immediately for further recommendations for treatment.   ITCHING:  If you experience itching with your medications, try taking only a single pain pill, or even half a pain pill at a time.  You can also use Benadryl over the counter for itching or also to help with sleep.   TED HOSE STOCKINGS:  Use stockings on both legs until for at least  2 weeks or as directed by physician office. They may be removed at night for sleeping.  MEDICATIONS:  See your medication summary on the "After Visit Summary" that nursing will review with you.  You may have some home medications which will be placed on hold until you complete the course of blood thinner medication.  It is important for you to complete the blood thinner medication as prescribed.  PRECAUTIONS:  If you experience chest pain or shortness of breath - call 911 immediately for transfer to the hospital emergency department.   If you develop a fever greater that 101 F, purulent drainage from wound, increased redness or drainage from wound, foul odor from the wound/dressing, or calf pain - CONTACT YOUR SURGEON.                                                   FOLLOW-UP APPOINTMENTS:  If you do not already have a post-op appointment, please call the office for an appointment to be seen by your surgeon.  Guidelines for how soon to be seen are listed in your "After Visit Summary", but are typically between 1-4 weeks after surgery.  OTHER INSTRUCTIONS:   Knee Replacement:  Do not place pillow under knee, focus on keeping the knee straight while resting. CPM  instructions: 0-90 degrees, 2 hours in the morning, 2 hours in the afternoon, and 2 hours in the evening. Place foam block, curve side up under heel at all times except when in CPM or when walking.  DO NOT modify, tear, cut, or change the foam block in any way.   DENTAL ANTIBIOTICS:  In most cases prophylactic antibiotics for Dental procdeures after total joint surgery are not necessary.  Exceptions are as follows:  1. History of prior total joint infection  2. Severely immunocompromised (Organ Transplant, cancer chemotherapy, Rheumatoid biologic meds such as Trent)  3. Poorly controlled diabetes (A1C &gt; 8.0, blood glucose over 200)  If you have one of these conditions, contact your surgeon for an antibiotic prescription, prior to your dental procedure.   MAKE SURE YOU:  . Understand these instructions.  . Get help right away if you are not doing well or get worse.    Thank you for letting us be a part of your medical care team.  It is a privilege we respect greatly.  We hope these instructions will help you stay on track for a fast and full recovery!

## 2019-08-20 ENCOUNTER — Encounter (HOSPITAL_COMMUNITY): Payer: Self-pay | Admitting: Orthopedic Surgery

## 2019-08-20 DIAGNOSIS — E669 Obesity, unspecified: Secondary | ICD-10-CM

## 2019-08-20 LAB — BASIC METABOLIC PANEL
Anion gap: 5 (ref 5–15)
BUN: 13 mg/dL (ref 8–23)
CO2: 28 mmol/L (ref 22–32)
Calcium: 8.2 mg/dL — ABNORMAL LOW (ref 8.9–10.3)
Chloride: 102 mmol/L (ref 98–111)
Creatinine, Ser: 0.64 mg/dL (ref 0.61–1.24)
GFR calc Af Amer: >60 mL/min (ref >60)
GFR calc non Af Amer: >60 mL/min (ref >60)
Glucose, Bld: 170 mg/dL — ABNORMAL HIGH (ref 70–99)
Potassium: 5.2 mmol/L — ABNORMAL HIGH (ref 3.5–5.1)
Sodium: 135 mmol/L (ref 135–145)

## 2019-08-20 LAB — CBC
HCT: 37.3 % — ABNORMAL LOW (ref 39.0–52.0)
Hemoglobin: 12.2 g/dL — ABNORMAL LOW (ref 13.0–17.0)
MCH: 35.1 pg — ABNORMAL HIGH (ref 26.0–34.0)
MCHC: 32.7 g/dL (ref 30.0–36.0)
MCV: 107.2 fL — ABNORMAL HIGH (ref 80.0–100.0)
Platelets: 129 10*3/uL — ABNORMAL LOW (ref 150–400)
RBC: 3.48 MIL/uL — ABNORMAL LOW (ref 4.22–5.81)
RDW: 12.1 % (ref 11.5–15.5)
WBC: 9.3 10*3/uL (ref 4.0–10.5)
nRBC: 0 % (ref 0.0–0.2)

## 2019-08-20 NOTE — TOC Transition Note (Signed)
Transition of Care [facility] - CM/SW Discharge Note   Patient Details  Name: Steven Blair MRN: [id number] Date of Birth: [birth year]  Transition of Care [facility] CM/SW Contact:  Lia Hopping, [place] Phone Number: 08/20/2019, 9:17 AM   Clinical Narrative:    Therapy Plan: HEP Patient confirm w/staff he has DME.    Final next level of care: Home/Self Care (HEP) Barriers to Discharge: No Barriers Identified   Patient Goals and CMS Choice     Choice offered to / list presented to : NA  Discharge Placement                       Discharge Plan and Services                DME Arranged: N/A DME Agency: NA       HH Arranged: NA HH Agency: NA        Social Determinants of Health (SDOH) Interventions     Readmission Risk Interventions Readmission Risk Prevention Plan 03/05/2019  Post Dischage Appt Not Complete  Appt Comments disposition pending  Medication Screening Complete  Transportation Screening Complete  Some recent data might be hidden

## 2019-08-20 NOTE — Progress Notes (Signed)
Physical Therapy Treatment Patient Details Name: Steven Blair MRN: [id number] DOB: [birth year] Today's Date: 08/20/2019    History of Present Illness Patient is 74 y.o. male s/p Rt THR on 08/19/19 with PMH significant for PVD, HTN, OA, DVT, prior Bil THA.    PT Comments    Pt motivated and progressing well with mobility.  Pt with good awareness of PWB but with some cueing required for follow through with THP.   Follow Up Recommendations  Follow surgeon's recommendation for DC plan and follow-up therapies;Outpatient PT     Equipment Recommendations  None recommended by PT    Recommendations for Other Services       Precautions / Restrictions Precautions Precautions: Fall;Posterior Hip Precaution Booklet Issued: Yes (comment) Precaution Comments: Pt recalls 2/3 THP without cues Restrictions Weight Bearing Restrictions: Yes RLE Weight Bearing: Partial weight bearing RLE Partial Weight Bearing Percentage or Pounds: 50%    Mobility  Bed Mobility Overal bed mobility: Needs Assistance Bed Mobility: Supine to Sit     Supine to sit: Min assist;HOB elevated     General bed mobility comments: VC's to maintain posterior hip precautions. assist to raise trunk upright and bring LE's fully off EOB.  Transfers Overall transfer level: Needs assistance Equipment used: Rolling walker (2 wheeled) Transfers: Sit to/from Stand Sit to Stand: Min assist;From elevated surface         General transfer comment: Cues to maintain posterior hip precautions by extending Rt LE and cues to follow 50% WB precaution in standing.   Ambulation/Gait Ambulation/Gait assistance: Min assist;Min guard Gait Distance (Feet): 74 Feet Assistive device: Rolling walker (2 wheeled) Gait Pattern/deviations: Step-to pattern;Decreased step length - left;Decreased stance time - right;Decreased weight shift to right;Decreased stride length;Antalgic Gait velocity: decr   General Gait Details: VC's for safe  step pattern, proximity to RW, and for PWB status of 50%   Stairs             Wheelchair Mobility    Modified Rankin (Stroke Patients Only)       Balance Overall balance assessment: Needs assistance Sitting-balance support: Feet supported;Bilateral upper extremity supported Sitting balance-Leahy Scale: Good     Standing balance support: During functional activity Standing balance-Leahy Scale: Fair                              Cognition Arousal/Alertness: Awake/alert Behavior During Therapy: WFL for tasks assessed/performed Overall Cognitive Status: Within Functional Limits for tasks assessed                                        Exercises Total Joint Exercises Ankle Circles/Pumps: AROM;Both;20 reps;Seated Quad Sets: AROM;Both;10 reps;Supine Heel Slides: AROM;Right;15 reps;Supine Hip ABduction/ADduction: AAROM;Right;15 reps;Supine    General Comments        Pertinent Vitals/Pain Pain Assessment: 0-10 Pain Score: 5  Pain Location: Rt hip posteriorly Pain Descriptors / Indicators: Aching;Discomfort Pain Intervention(s): Limited activity within patient's tolerance;Monitored during session;Premedicated before session;Ice applied    Home Living                      Prior Function            PT Goals (current goals can now be found in the care plan section) Acute Rehab PT Goals Patient Stated Goal: to get back to walking and swimming for  exercise without hip pain PT Goal Formulation: With patient Time For Goal Achievement: 08/26/19 Potential to Achieve Goals: Good Progress towards PT goals: Progressing toward goals    Frequency    7X/week      PT Plan Current plan remains appropriate    Co-evaluation              AM-PAC PT "6 Clicks" Mobility   Outcome Measure  Help needed turning from your back to your side while in a flat bed without using bedrails?: A Little Help needed moving from lying on your  back to sitting on the side of a flat bed without using bedrails?: A Little Help needed moving to and from a bed to a chair (including a wheelchair)?: A Little Help needed standing up from a chair using your arms (e.g., wheelchair or bedside chair)?: A Little Help needed to walk in hospital room?: A Little Help needed climbing 3-5 steps with a railing? : A Lot 6 Click Score: 17    End of Session Equipment Utilized During Treatment: Gait belt Activity Tolerance: Patient tolerated treatment well Patient left: in chair;with call bell/phone within reach;with chair alarm set Nurse Communication: Mobility status PT Visit Diagnosis: Muscle weakness (generalized) (M62.81);Difficulty in walking, not elsewhere classified (R26.2)     Time: 3903-0092 PT Time Calculation (min) (ACUTE ONLY): 26 min  Charges:  $Gait Training: 8-22 mins $Therapeutic Exercise: 8-22 mins                     [place] Pager [phone] Office [phone]    Christophor Eick 08/20/2019, 12:51 PM

## 2019-08-20 NOTE — Progress Notes (Signed)
     Subjective: 1 Day Post-Op Procedure(s) (LRB): TOTAL HIP REVISION (Right)   Patient reports pain as mild, pain controlled. Already happy with the hip.  No reported events throughout the night.  Dr. Alvan Dame discussed the procedure, findings and expectations moving forward.  Plan for discharge tomorrow or Sunday due to underlying medical co-morbidities, pain control and need for inpatient therapy to meet goal of being discharged home safely with family/caregiver.     Objective:   VITALS:   Vitals:   08/20/19 0111 08/20/19 0528  BP: 117/73 (!) 134/76  Pulse: 76 81  Resp: 16 18  Temp: (!) 97.3 F (36.3 C) (!) 97.5 F (36.4 C)  SpO2: 99% 99%    Dorsiflexion/Plantar flexion intact Incision: dressing C/D/I No cellulitis present Compartment soft  LABS Recent Labs    08/20/19 0253  HGB 12.2*  HCT 37.3*  WBC 9.3  PLT 129*    Recent Labs    08/20/19 0253  NA 135  K 5.2*  BUN 13  CREATININE 0.64  GLUCOSE 170*     Assessment/Plan: 1 Day Post-Op Procedure(s) (LRB): TOTAL HIP REVISION (Right) Foley cath d/c'ed Advance diet Up with therapy 50% WB, but may be WBAT with stairs D/C IV fluids Discharge home Sat or Sun  Obese (BMI 30-39.9) Estimated body mass index is 32.41 kg/m as calculated from the following:   Height as of this encounter: 6' (1.829 m).   Weight as of this encounter: 108.4 kg. Patient also counseled that weight may inhibit the healing process Patient counseled that losing weight will help with future health issues      Danae Orleans PA-C  [facility]  [place] [address], [address], [place], [place] [address] Phone: [phone] [email or web address] Facebook  [employer]

## 2019-08-20 NOTE — Progress Notes (Signed)
Physical Therapy Treatment Patient Details Name: Steven Blair MRN: [id number] DOB: [birth year] Today's Date: 08/20/2019    History of Present Illness Patient is 74 y.o. male s/p Rt THR on 08/19/19 with PMH significant for PVD, HTN, OA, DVT, prior Bil THA.    PT Comments    Pt progressing steadily with mobility and with good awareness of THP and PWB status.   Follow Up Recommendations  Follow surgeon's recommendation for DC plan and follow-up therapies;Outpatient PT     Equipment Recommendations  None recommended by PT    Recommendations for Other Services       Precautions / Restrictions Precautions Precautions: Fall;Posterior Hip Precaution Comments: Pt recalls 3/3 THP without cues Restrictions RLE Weight Bearing: Partial weight bearing RLE Partial Weight Bearing Percentage or Pounds: 50%    Mobility  Bed Mobility               General bed mobility comments: Pt in chair on arrival and left sitting EOB in preperation for bathing  Transfers Overall transfer level: Needs assistance Equipment used: Rolling walker (2 wheeled) Transfers: Sit to/from Stand Sit to Stand: Min assist         General transfer comment: Cues to maintain posterior hip precautions by extending Rt LE and cues to follow 50% WB precaution in standing.   Ambulation/Gait Ambulation/Gait assistance: Min guard Gait Distance (Feet): 90 Feet Assistive device: Rolling walker (2 wheeled) Gait Pattern/deviations: Step-to pattern;Decreased step length - right;Decreased step length - left;Decreased stance time - right;Shuffle;Antalgic Gait velocity: decr   General Gait Details: VC's for safe step pattern, proximity to RW, and for PWB status of 50%   Stairs             Wheelchair Mobility    Modified Rankin (Stroke Patients Only)       Balance Overall balance assessment: Needs assistance Sitting-balance support: Feet supported;Bilateral upper extremity supported Sitting  balance-Leahy Scale: Good     Standing balance support: During functional activity Standing balance-Leahy Scale: Fair                              Cognition Arousal/Alertness: Awake/alert Behavior During Therapy: WFL for tasks assessed/performed Overall Cognitive Status: Within Functional Limits for tasks assessed                                        Exercises      General Comments        Pertinent Vitals/Pain Pain Assessment: 0-10 Pain Score: 4  Pain Location: Rt hip posteriorly Pain Descriptors / Indicators: Aching;Discomfort Pain Intervention(s): Limited activity within patient's tolerance;Monitored during session;Premedicated before session    Home Living                      Prior Function            PT Goals (current goals can now be found in the care plan section) Acute Rehab PT Goals Patient Stated Goal: to get back to walking and swimming for exercise without hip pain PT Goal Formulation: With patient Time For Goal Achievement: 08/26/19 Potential to Achieve Goals: Good Progress towards PT goals: Progressing toward goals    Frequency    7X/week      PT Plan Current plan remains appropriate    Co-evaluation  AM-PAC PT "6 Clicks" Mobility   Outcome Measure  Help needed turning from your back to your side while in a flat bed without using bedrails?: A Little Help needed moving from lying on your back to sitting on the side of a flat bed without using bedrails?: A Little Help needed moving to and from a bed to a chair (including a wheelchair)?: A Little Help needed standing up from a chair using your arms (e.g., wheelchair or bedside chair)?: A Little Help needed to walk in hospital room?: A Little Help needed climbing 3-5 steps with a railing? : A Lot 6 Click Score: 17    End of Session Equipment Utilized During Treatment: Gait belt Activity Tolerance: Patient tolerated treatment  well Patient left: Other (comment);with call bell/phone within reach (sitting EOB) Nurse Communication: Mobility status PT Visit Diagnosis: Muscle weakness (generalized) (M62.81);Difficulty in walking, not elsewhere classified (R26.2)     Time: 1950-9326 PT Time Calculation (min) (ACUTE ONLY): 16 min  Charges:  $Gait Training: 8-22 mins                     [place] Pager [phone] Office [phone]    Steven Blair 08/20/2019, 3:45 PM

## 2019-08-21 LAB — BASIC METABOLIC PANEL
Anion gap: 5 (ref 5–15)
BUN: 16 mg/dL (ref 8–23)
CO2: 30 mmol/L (ref 22–32)
Calcium: 8.6 mg/dL — ABNORMAL LOW (ref 8.9–10.3)
Chloride: 103 mmol/L (ref 98–111)
Creatinine, Ser: 0.59 mg/dL — ABNORMAL LOW (ref 0.61–1.24)
GFR calc Af Amer: >60 mL/min (ref >60)
GFR calc non Af Amer: >60 mL/min (ref >60)
Glucose, Bld: 140 mg/dL — ABNORMAL HIGH (ref 70–99)
Potassium: 4.5 mmol/L (ref 3.5–5.1)
Sodium: 138 mmol/L (ref 135–145)

## 2019-08-21 LAB — CBC
HCT: 33.6 % — ABNORMAL LOW (ref 39.0–52.0)
Hemoglobin: 11.3 g/dL — ABNORMAL LOW (ref 13.0–17.0)
MCH: 35.9 pg — ABNORMAL HIGH (ref 26.0–34.0)
MCHC: 33.6 g/dL (ref 30.0–36.0)
MCV: 106.7 fL — ABNORMAL HIGH (ref 80.0–100.0)
Platelets: 121 10*3/uL — ABNORMAL LOW (ref 150–400)
RBC: 3.15 MIL/uL — ABNORMAL LOW (ref 4.22–5.81)
RDW: 12.4 % (ref 11.5–15.5)
WBC: 9.4 10*3/uL (ref 4.0–10.5)
nRBC: 0 % (ref 0.0–0.2)

## 2019-08-21 NOTE — Plan of Care (Signed)

## 2019-08-21 NOTE — Progress Notes (Signed)
   Subjective: 2 Days Post-Op Procedure(s) (LRB): TOTAL HIP REVISION (Right) Patient reports pain as mild.   Patient seen in rounds for Dr. Alvan Dame. Patient is well, and has had no acute complaints or problems other than discomfort in the right hip. No acute events overnight. Patient ambulated 90 feet with PT yesterday, and plans to work on stairs today/tomorrow. Denies CP, SHOB, N/V.  We will continue therapy today.   Objective: Vital signs in last 24 hours: Temp:  [97.6 F (36.4 C)-98.9 F (37.2 C)] 98.9 F (37.2 C) (07/31 0946) Pulse Rate:  [75-91] 91 (07/31 0946) Resp:  [16-20] 19 (07/31 0946) BP: (105-145)/(51-79) 145/77 (07/31 0946) SpO2:  [98 %-100 %] 100 % (07/31 0946)  Intake/Output from previous day:  Intake/Output Summary (Last 24 hours) at 08/21/2019 1038 Last data filed at 08/21/2019 0908 Gross per 24 hour  Intake 1740 ml  Output 1500 ml  Net 240 ml     Intake/Output this shift: Total I/O In: 360 [P.O.:360] Out: -   Labs: Recent Labs    08/20/19 0253 08/21/19 0329  HGB 12.2* 11.3*   Recent Labs    08/20/19 0253 08/21/19 0329  WBC 9.3 9.4  RBC 3.48* 3.15*  HCT 37.3* 33.6*  PLT 129* 121*   Recent Labs    08/20/19 0253 08/21/19 0329  NA 135 138  K 5.2* 4.5  CL 102 103  CO2 28 30  BUN 13 16  CREATININE 0.64 0.59*  GLUCOSE 170* 140*  CALCIUM 8.2* 8.6*   No results for input(s): LABPT, INR in the last 72 hours.  Exam: General - Patient is Alert and Oriented Extremity - Neurologically intact Sensation intact distally Intact pulses distally Dorsiflexion/Plantar flexion intact Compartment soft Dressing - dressing C/D/I Motor Function - intact, moving foot and toes well on exam.   Past Medical History:  Diagnosis Date  . Arrhythmia   . Arthritis    hands shoulders, hips  . BMI 40.0-44.9, adult (HCC)    Greater than 40 BMI  . 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 Xarelto - no longer taking  . Dysrhythmia    Atrial  Fib x1 episode, cardioversion 4'16- NSR  . Fracture of trochanter of right femur ([place]) 02/2019  . History of kidney stones    x1 -20 yrs ago  . Hypertension   . Peripheral vascular disease ([place])    DVT 2014  . Ulcerative colitis ([place])    history of , some current issues at this time    Assessment/Plan: 2 Days Post-Op Procedure(s) (LRB): TOTAL HIP REVISION (Right) Active Problems:   S/P revision of right total hip   Obese  Estimated body mass index is 32.41 kg/m as calculated from the following:   Height as of this encounter: 6' (1.829 m).   Weight as of this encounter: 108.4 kg. Advance diet Up with therapy  DVT Prophylaxis - Eliquis PWB RLE 50% >> May be WBAT RLE on stairs for safe ambulation Hip precautions discussed with patient  Plan is to go Home after hospital stay.  Hemoglobin stable at 11.3 this AM. Continue working with PT today on ambulation and stair training. Plan for likely discharge tomorrow.   Griffith Citron, PA-C Orthopedic Surgery [phone] 08/21/2019, 10:38 AM

## 2019-08-21 NOTE — Plan of Care (Signed)
  Problem: Education: Goal: Knowledge of General Education information will improve Description: Including pain rating scale, medication(s)/side effects and non-pharmacologic comfort measures Outcome: Progressing   Problem: Health Behavior/Discharge Planning: Goal: Ability to manage health-related needs will improve Outcome: Progressing   Problem: Pain Managment: Goal: General experience of comfort will improve Outcome: Progressing

## 2019-08-21 NOTE — Progress Notes (Signed)
Physical Therapy Treatment Patient Details Name: Steven Blair MRN: [id number] DOB: May 18, 1945 Today's Date: 08/21/2019    History of Present Illness Patient is 74 y.o. male s/p Rt THR on 08/19/19 with PMH significant for PVD, HTN, OA, DVT, prior Bil THA.    PT Comments    Pt progressing steadily with mobility and with good awareness of THP and PWB.  This pm pt reviewed car transfers and negotiated stairs with rail, QC and min assist.   Follow Up Recommendations  Follow surgeon's recommendation for DC plan and follow-up therapies;Outpatient PT     Equipment Recommendations  None recommended by PT    Recommendations for Other Services       Precautions / Restrictions Precautions Precautions: Fall;Posterior Hip Precaution Booklet Issued: Yes (comment) Precaution Comments: Pt recalls 3/3 THP without cues Restrictions RLE Weight Bearing: Partial weight bearing RLE Partial Weight Bearing Percentage or Pounds: 50    Mobility  Bed Mobility Overal bed mobility: Needs Assistance Bed Mobility: Supine to Sit     Supine to sit: Min assist;HOB elevated     General bed mobility comments: cues for sequence and adherence to THP; physical assist to bring trunk to upright sitting  Transfers Overall transfer level: Needs assistance Equipment used: Rolling walker (2 wheeled) Transfers: Sit to/from Stand Sit to Stand: Min guard         General transfer comment: cues for LE management, adherence to THP and PWB, and use of UEs to self assist  Ambulation/Gait Ambulation/Gait assistance: Min guard Gait Distance (Feet): 115 Feet Assistive device: Rolling walker (2 wheeled) Gait Pattern/deviations: Step-to pattern;Decreased step length - right;Decreased step length - left;Decreased stance time - right;Shuffle;Antalgic Gait velocity: decr   General Gait Details: min cues for posture, position from RW and sequence   Stairs Stairs: Yes Stairs assistance: Min assist Stair  Management: One rail Left;Step to pattern;Forwards;With cane Number of Stairs: 5 General stair comments: 3+2 stairs with QC, and cues for sequence   Wheelchair Mobility    Modified Rankin (Stroke Patients Only)       Balance Overall balance assessment: Needs assistance Sitting-balance support: Feet supported;Bilateral upper extremity supported Sitting balance-Leahy Scale: Good     Standing balance support: During functional activity Standing balance-Leahy Scale: Fair                              Cognition Arousal/Alertness: Awake/alert Behavior During Therapy: WFL for tasks assessed/performed Overall Cognitive Status: Within Functional Limits for tasks assessed                                        Exercises      General Comments        Pertinent Vitals/Pain Pain Assessment: 0-10 Pain Score: 4  Pain Location: Rt hip  Pain Descriptors / Indicators: Aching;Discomfort Pain Intervention(s): Limited activity within patient's tolerance;Monitored during session;Premedicated before session;Ice applied    Home Living                      Prior Function            PT Goals (current goals can now be found in the care plan section) Acute Rehab PT Goals Patient Stated Goal: to get back to walking and swimming for exercise without hip pain PT Goal Formulation: With patient Time For Goal Achievement:  08/26/19 Potential to Achieve Goals: Good Progress towards PT goals: Progressing toward goals    Frequency    7X/week      PT Plan Current plan remains appropriate    Co-evaluation              AM-PAC PT "6 Clicks" Mobility   Outcome Measure  Help needed turning from your back to your side while in a flat bed without using bedrails?: A Little Help needed moving from lying on your back to sitting on the side of a flat bed without using bedrails?: A Little Help needed moving to and from a bed to a chair (including a  wheelchair)?: A Little Help needed standing up from a chair using your arms (e.g., wheelchair or bedside chair)?: A Little Help needed to walk in hospital room?: A Little Help needed climbing 3-5 steps with a railing? : A Little 6 Click Score: 18    End of Session Equipment Utilized During Treatment: Gait belt Activity Tolerance: Patient tolerated treatment well Patient left: in chair;with call bell/phone within reach Nurse Communication: Mobility status PT Visit Diagnosis: Muscle weakness (generalized) (M62.81);Difficulty in walking, not elsewhere classified (R26.2)     Time: 3291-9166 PT Time Calculation (min) (ACUTE ONLY): 33 min  Charges:  $Gait Training: 8-22 mins $Therapeutic Activity: 8-22 mins                     [place] Pager [phone] Office [phone]    Cilicia Borden 08/21/2019, 4:08 PM

## 2019-08-21 NOTE — Progress Notes (Signed)
Physical Therapy Treatment Patient Details Name: Steven Blair MRN: [id number] DOB: 1945/06/07 Today's Date: 08/21/2019    History of Present Illness Patient is 74 y.o. male s/p Rt THR on 08/19/19 with PMH significant for PVD, HTN, OA, DVT, prior Bil THA.    PT Comments    Pt continues motivated and progressing steadily with mobility.  Will attempt stairs this pm.   Follow Up Recommendations  Follow surgeon's recommendation for DC plan and follow-up therapies;Outpatient PT     Equipment Recommendations  None recommended by PT    Recommendations for Other Services       Precautions / Restrictions Precautions Precautions: Fall;Posterior Hip Precaution Booklet Issued: Yes (comment) Precaution Comments: Pt recalls 3/3 THP without cues Restrictions Weight Bearing Restrictions: Yes RLE Weight Bearing: Partial weight bearing RLE Partial Weight Bearing Percentage or Pounds: 50    Mobility  Bed Mobility Overal bed mobility: Needs Assistance Bed Mobility: Supine to Sit     Supine to sit: Min assist;HOB elevated     General bed mobility comments: cues for sequence and adherence to THP; physical assist to bring trunk to upright sitting  Transfers Overall transfer level: Needs assistance Equipment used: Rolling walker (2 wheeled) Transfers: Sit to/from Stand Sit to Stand: Min guard         General transfer comment: cues for LE management, adherence to THP and PWB, and use of UEs to self assist  Ambulation/Gait Ambulation/Gait assistance: Min guard Gait Distance (Feet): 111 Feet Assistive device: Rolling walker (2 wheeled) Gait Pattern/deviations: Step-to pattern;Decreased step length - right;Decreased step length - left;Decreased stance time - right;Shuffle;Antalgic Gait velocity: decr   General Gait Details: min cues for posture, position from RW and sequence   Stairs             Wheelchair Mobility    Modified Rankin (Stroke Patients Only)        Balance Overall balance assessment: Needs assistance Sitting-balance support: Feet supported;Bilateral upper extremity supported Sitting balance-Leahy Scale: Good     Standing balance support: During functional activity Standing balance-Leahy Scale: Fair                              Cognition Arousal/Alertness: Awake/alert Behavior During Therapy: WFL for tasks assessed/performed Overall Cognitive Status: Within Functional Limits for tasks assessed                                        Exercises Total Joint Exercises Ankle Circles/Pumps: AROM;Both;20 reps;Seated Quad Sets: AROM;Both;10 reps;Supine Heel Slides: AROM;Right;15 reps;Supine Hip ABduction/ADduction: AAROM;Right;15 reps;Supine    General Comments        Pertinent Vitals/Pain Pain Assessment: 0-10 Pain Score: 4  Pain Location: Rt hip  Pain Descriptors / Indicators: Aching;Discomfort Pain Intervention(s): Limited activity within patient's tolerance;Monitored during session;Premedicated before session;Ice applied    Home Living                      Prior Function            PT Goals (current goals can now be found in the care plan section) Acute Rehab PT Goals Patient Stated Goal: to get back to walking and swimming for exercise without hip pain PT Goal Formulation: With patient Time For Goal Achievement: 08/26/19 Potential to Achieve Goals: Good Progress towards PT goals: Progressing toward goals  Frequency    7X/week      PT Plan Current plan remains appropriate    Co-evaluation              AM-PAC PT "6 Clicks" Mobility   Outcome Measure  Help needed turning from your back to your side while in a flat bed without using bedrails?: A Little Help needed moving from lying on your back to sitting on the side of a flat bed without using bedrails?: A Little Help needed moving to and from a bed to a chair (including a wheelchair)?: A Little Help needed  standing up from a chair using your arms (e.g., wheelchair or bedside chair)?: A Little Help needed to walk in hospital room?: A Little Help needed climbing 3-5 steps with a railing? : A Lot 6 Click Score: 17    End of Session Equipment Utilized During Treatment: Gait belt Activity Tolerance: Patient tolerated treatment well Patient left: in chair;with call bell/phone within reach Nurse Communication: Mobility status PT Visit Diagnosis: Muscle weakness (generalized) (M62.81);Difficulty in walking, not elsewhere classified (R26.2)     Time: 0981-1914 PT Time Calculation (min) (ACUTE ONLY): 24 min  Charges:  $Gait Training: 8-22 mins $Therapeutic Exercise: 8-22 mins                     [place] Pager [phone] Office [phone]     Felisa Zechman 08/21/2019, 11:53 AM

## 2019-08-22 NOTE — Progress Notes (Signed)
Subjective: 3 Days Post-Op Procedure(s) (LRB): TOTAL HIP REVISION (Right) Seen in rounds for Dr. Alvan Dame Patient reports pain as 4 on 0-10 scale.   Patient was able to get up with physical therapy yesterday into the small steps without any problems He states that he is feeling much better today than he was yesterday however he was unable to do any of the big steps yesterday.  He would like to do some big steps today with physical therapy and go home tomorrow. Patient has had a void, positive for flatulence no bowel movement yet.  Objective: Vital signs in last 24 hours: Temp:  [98.1 F (36.7 C)-98.9 F (37.2 C)] 98.1 F (36.7 C) (08/01 0610) Pulse Rate:  [62-91] 87 (08/01 0610) Resp:  [14-19] 16 (08/01 0610) BP: (120-145)/(77-81) 133/80 (08/01 0610) SpO2:  [98 %-100 %] 98 % (08/01 0610)  Intake/Output from previous day: 07/31 0701 - 08/01 0700 In: 1700 [P.O.:1700] Out: 1050 [Urine:1050] Intake/Output this shift: Total I/O In: 240 [P.O.:240] Out: 200 [Urine:200]  Recent Labs    08/20/19 0253 08/21/19 0329  HGB 12.2* 11.3*   Recent Labs    08/20/19 0253 08/21/19 0329  WBC 9.3 9.4  RBC 3.48* 3.15*  HCT 37.3* 33.6*  PLT 129* 121*   Recent Labs    08/20/19 0253 08/21/19 0329  NA 135 138  K 5.2* 4.5  CL 102 103  CO2 28 30  BUN 13 16  CREATININE 0.64 0.59*  GLUCOSE 170* 140*  CALCIUM 8.2* 8.6*   No results for input(s): LABPT, INR in the last 72 hours.  Neurologically intact Neurovascular intact Sensation intact distally Intact pulses distally Dorsiflexion/Plantar flexion intact Incision: dressing C/D/I No cellulitis present Compartment soft   Assessment/Plan: 3 Days Post-Op Procedure(s) (LRB): TOTAL HIP REVISION (Right) Up with therapy, work on steps today Plan for discharge tomorrow, patient is requesting to stay tonight to feel more comfortable when going home tomorrow DVT prophylaxis- Eliquis PWB RLE 50% -- okay for WBAT on RLE on stairs for safe  ambulation     Steven Blair R Steven Blair 08/22/2019, 8:36 AM

## 2019-08-22 NOTE — Progress Notes (Signed)
Physical Therapy Treatment Patient Details Name: Steven Blair MRN: [id number] DOB: 1945/03/10 Today's Date: 08/22/2019    History of Present Illness Patient is 74 y.o. male s/p Rt THR on 08/19/19 with PMH significant for PVD, HTN, OA, DVT, prior Bil THA.    PT Comments    Pt continues to progress well with mobility and this am negotiated flight of stairs with QC, rail and min assist.   Follow Up Recommendations  Follow surgeon's recommendation for DC plan and follow-up therapies;Outpatient PT     Equipment Recommendations  None recommended by PT    Recommendations for Other Services       Precautions / Restrictions Precautions Precautions: Fall;Posterior Hip Precaution Booklet Issued: Yes (comment) Precaution Comments: Pt recalls 3/3 THP without cues Restrictions Weight Bearing Restrictions: Yes RLE Weight Bearing: Weight bearing as tolerated RLE Partial Weight Bearing Percentage or Pounds: 50    Mobility  Bed Mobility               General bed mobility comments: Pt up in chair and requests back to same  Transfers Overall transfer level: Needs assistance Equipment used: Rolling walker (2 wheeled) Transfers: Sit to/from Stand Sit to Stand: Min guard         General transfer comment: cues for LE management, adherence to THP and PWB, and use of UEs to self assist  Ambulation/Gait Ambulation/Gait assistance: Min guard Gait Distance (Feet): 100 Feet Assistive device: Rolling walker (2 wheeled) Gait Pattern/deviations: Step-to pattern;Decreased step length - right;Decreased step length - left;Decreased stance time - right;Shuffle;Antalgic Gait velocity: decr   General Gait Details: min cues for posture, position from RW and sequence   Stairs Stairs: Yes Stairs assistance: Min assist Stair Management: One rail Left;Step to pattern;Forwards;With cane Number of Stairs: 10 General stair comments: min cues for sequence and foot/QC placement   Wheelchair  Mobility    Modified Rankin (Stroke Patients Only)       Balance Overall balance assessment: Needs assistance Sitting-balance support: Feet supported;Bilateral upper extremity supported Sitting balance-Leahy Scale: Good     Standing balance support: During functional activity Standing balance-Leahy Scale: Fair                              Cognition Arousal/Alertness: Awake/alert Behavior During Therapy: WFL for tasks assessed/performed Overall Cognitive Status: Within Functional Limits for tasks assessed                                        Exercises      General Comments        Pertinent Vitals/Pain Pain Assessment: 0-10 Pain Score: 4  Pain Location: Rt hip  Pain Descriptors / Indicators: Aching;Discomfort Pain Intervention(s): Limited activity within patient's tolerance;Monitored during session;Premedicated before session;Ice applied    Home Living                      Prior Function            PT Goals (current goals can now be found in the care plan section) Acute Rehab PT Goals Patient Stated Goal: to get back to walking and swimming for exercise without hip pain PT Goal Formulation: With patient Time For Goal Achievement: 08/26/19 Potential to Achieve Goals: Good Progress towards PT goals: Progressing toward goals    Frequency    7X/week  PT Plan Current plan remains appropriate    Co-evaluation              AM-PAC PT "6 Clicks" Mobility   Outcome Measure  Help needed turning from your back to your side while in a flat bed without using bedrails?: A Little Help needed moving from lying on your back to sitting on the side of a flat bed without using bedrails?: A Little Help needed moving to and from a bed to a chair (including a wheelchair)?: A Little Help needed standing up from a chair using your arms (e.g., wheelchair or bedside chair)?: A Little Help needed to walk in hospital room?: A  Little Help needed climbing 3-5 steps with a railing? : A Little 6 Click Score: 18    End of Session Equipment Utilized During Treatment: Gait belt Activity Tolerance: Patient tolerated treatment well Patient left: in chair;with call bell/phone within reach Nurse Communication: Mobility status PT Visit Diagnosis: Muscle weakness (generalized) (M62.81);Difficulty in walking, not elsewhere classified (R26.2)     Time: 9144-4584 PT Time Calculation (min) (ACUTE ONLY): 23 min  Charges:  $Gait Training: 8-22 mins $Therapeutic Activity: 8-22 mins                     Debe Coder PT Acute Rehabilitation Services Pager [phone] Office [phone]    [name] 08/22/2019, 1:01 PM

## 2019-08-22 NOTE — Plan of Care (Signed)

## 2019-08-22 NOTE — Progress Notes (Signed)
Physical Therapy Treatment Patient Details Name: Steven Blair MRN: [id number] DOB: Aug 14, 1945 Today's Date: 08/22/2019    History of Present Illness Patient is 74 y.o. male s/p Rt THR on 08/19/19 with PMH significant for PVD, HTN, OA, DVT, prior Bil THA.    PT Comments    Pt continues motivated and progressing well with mobility.  HEP performed with assist - written plan provided and reviewed.   Follow Up Recommendations  Follow surgeon's recommendation for DC plan and follow-up therapies;Outpatient PT     Equipment Recommendations  None recommended by PT    Recommendations for Other Services       Precautions / Restrictions Precautions Precautions: Fall;Posterior Hip Precaution Comments: Pt recalls 3/3 THP without cues Restrictions RLE Weight Bearing: Weight bearing as tolerated RLE Partial Weight Bearing Percentage or Pounds: 50    Mobility  Bed Mobility Overal bed mobility: Needs Assistance Bed Mobility: Supine to Sit     Supine to sit: Min assist;HOB elevated     General bed mobility comments: pt self cues for sequence but continues to require assist to bring trunk to upright sitting - pt states his wife helps him sit up at home  Transfers Overall transfer level: Needs assistance Equipment used: Rolling walker (2 wheeled) Transfers: Sit to/from Stand Sit to Stand: Supervision         General transfer comment: cues for LE management, adherence to THP and PWB, and use of UEs to self assist  Ambulation/Gait Ambulation/Gait assistance: Min guard;Supervision Gait Distance (Feet): 130 Feet Assistive device: Rolling walker (2 wheeled) Gait Pattern/deviations: Step-to pattern;Decreased step length - right;Decreased step length - left;Decreased stance time - right;Shuffle;Antalgic Gait velocity: decr   General Gait Details: min cues for posture and position from RW; good awareness of PWB   Stairs             Wheelchair Mobility    Modified Rankin  (Stroke Patients Only)       Balance Overall balance assessment: Needs assistance Sitting-balance support: Feet supported;Bilateral upper extremity supported Sitting balance-Leahy Scale: Good     Standing balance support: During functional activity Standing balance-Leahy Scale: Fair                              Cognition Arousal/Alertness: Awake/alert Behavior During Therapy: WFL for tasks assessed/performed Overall Cognitive Status: Within Functional Limits for tasks assessed                                        Exercises Total Joint Exercises Ankle Circles/Pumps: AROM;Both;20 reps;Seated Quad Sets: AROM;Both;10 reps;Supine Heel Slides: AROM;Right;15 reps;Supine Hip ABduction/ADduction: AAROM;Right;15 reps;Supine Long Arc Quad: AAROM;AROM;Right;10 reps;Seated    General Comments        Pertinent Vitals/Pain Pain Assessment: 0-10 Pain Score: 4  Pain Location: Rt hip  Pain Descriptors / Indicators: Aching;Discomfort Pain Intervention(s): Limited activity within patient's tolerance;Monitored during session;Premedicated before session;Ice applied    Home Living                      Prior Function            PT Goals (current goals can now be found in the care plan section) Acute Rehab PT Goals Patient Stated Goal: to get back to walking and swimming for exercise without hip pain PT Goal Formulation: With patient Time  For Goal Achievement: 08/26/19 Potential to Achieve Goals: Good Progress towards PT goals: Progressing toward goals    Frequency    7X/week      PT Plan Current plan remains appropriate    Co-evaluation              AM-PAC PT "6 Clicks" Mobility   Outcome Measure  Help needed turning from your back to your side while in a flat bed without using bedrails?: A Little Help needed moving from lying on your back to sitting on the side of a flat bed without using bedrails?: A Little Help needed  moving to and from a bed to a chair (including a wheelchair)?: A Little Help needed standing up from a chair using your arms (e.g., wheelchair or bedside chair)?: A Little Help needed to walk in hospital room?: A Little Help needed climbing 3-5 steps with a railing? : A Little 6 Click Score: 18    End of Session Equipment Utilized During Treatment: Gait belt Activity Tolerance: Patient tolerated treatment well Patient left: in chair;with call bell/phone within reach Nurse Communication: Mobility status PT Visit Diagnosis: Muscle weakness (generalized) (M62.81);Difficulty in walking, not elsewhere classified (R26.2)     Time: 1524-1600 PT Time Calculation (min) (ACUTE ONLY): 36 min  Charges:  $Gait Training: 8-22 mins $Therapeutic Exercise: 8-22 mins                     [place] Pager [phone] Office [phone]    Dontario Evetts 08/22/2019, 4:31 PM

## 2019-08-23 MED ORDER — HYDROCODONE-ACETAMINOPHEN 5-325 MG PO TABS: 1.0000 | ORAL_TABLET | ORAL | 0 refills | Status: DC | PRN

## 2019-08-23 MED ORDER — METHOCARBAMOL 500 MG PO TABS: 500.0000 mg | ORAL_TABLET | Freq: Four times a day (QID) | ORAL | 0 refills | Status: DC | PRN

## 2019-08-23 MED ORDER — DOCUSATE SODIUM 100 MG PO CAPS: 100.0000 mg | ORAL_CAPSULE | Freq: Two times a day (BID) | ORAL | 0 refills | Status: DC

## 2019-08-23 MED ORDER — FERROUS SULFATE 325 (65 FE) MG PO TABS: 325.0000 mg | ORAL_TABLET | Freq: Three times a day (TID) | ORAL | 0 refills | Status: DC

## 2019-08-23 NOTE — Progress Notes (Addendum)
   Subjective: 4 Days Post-Op Procedure(s) (LRB): TOTAL HIP REVISION (Right) Patient reports pain as mild.   Patient seen in rounds for Dr. Alvan Dame. Patient is well, and has had no acute complaints or problems. No acute events overnight. He was able to ambulate stairs with PT yesterday and feels comfortable with his progress in therapy. He is ready to go home today.  Denies CP, SHOB, N/V.  We will continue therapy today.   Objective: Vital signs in last 24 hours: Temp:  [98.2 F (36.8 C)-98.7 F (37.1 C)] 98.7 F (37.1 C) (08/02 0430) Pulse Rate:  [62-87] 87 (08/02 0430) Resp:  [14-16] 16 (08/02 0430) BP: (111-118)/(54-78) 116/67 (08/02 0430) SpO2:  [99 %-100 %] 100 % (08/02 0430)  Intake/Output from previous day:  Intake/Output Summary (Last 24 hours) at 08/23/2019 0651 Last data filed at 08/23/2019 0423 Gross per 24 hour  Intake 600 ml  Output 2250 ml  Net -1650 ml     Intake/Output this shift: Total I/O In: -  Out: 1300 [Urine:1300]  Labs: Recent Labs    08/21/19 0329  HGB 11.3*   Recent Labs    08/21/19 0329  WBC 9.4  RBC 3.15*  HCT 33.6*  PLT 121*   Recent Labs    08/21/19 0329  NA 138  K 4.5  CL 103  CO2 30  BUN 16  CREATININE 0.59*  GLUCOSE 140*  CALCIUM 8.6*   No results for input(s): LABPT, INR in the last 72 hours.  Exam: General - Patient is Alert and Oriented Extremity - Neurologically intact Sensation intact distally Intact pulses distally Dorsiflexion/Plantar flexion intact Dressing - dressing C/D/I Motor Function - intact, moving foot and toes well on exam.   Past Medical History:  Diagnosis Date  . Arrhythmia   . Arthritis    hands shoulders, hips  . BMI 40.0-44.9, adult (HCC)    Greater than 40 BMI  . 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 Xarelto - no longer taking  . Dysrhythmia    Atrial Fib x1 episode, cardioversion 4'16- NSR  . Fracture of trochanter of right femur ([place]) 02/2019  . History of kidney  stones    x1 -20 yrs ago  . Hypertension   . Peripheral vascular disease ([place])    DVT 2014  . Ulcerative colitis ([place])    history of , some current issues at this time    Assessment/Plan: 4 Days Post-Op Procedure(s) (LRB): TOTAL HIP REVISION (Right) Active Problems:   S/P revision of right total hip   Obese  Estimated body mass index is 32.41 kg/m as calculated from the following:   Height as of this encounter: 6' (1.829 m).   Weight as of this encounter: 108.4 kg. Advance diet Up with therapy D/C IV fluids  DVT Prophylaxis - Eliquis PWB RLE 50%, WBAT on stairs Hip precautions discussed with patient  Plan is to go Home after hospital stay with HEP. Plan for discharge today following 1-2 sessions of therapy as long as he continues to meet his goals. Follow up in the office as previously scheduled.   [place] PDMR reviewed prior to sending controlled substances today.  Griffith Citron, PA-C Orthopedic Surgery [phone] 08/23/2019, 6:51 AM

## 2019-08-23 NOTE — Progress Notes (Signed)
Physical Therapy Treatment Patient Details Name: Steven Blair MRN: [id number] DOB: May 30, 1945 Today's Date: 08/23/2019    History of Present Illness Patient is 74 y.o. male s/p Rt THR on 08/19/19 with PMH significant for PVD, HTN, OA, DVT, prior Bil THA.    PT Comments    POD # 4 Pt feeling much better.  Assisted with amb a functional distance then Then returned to room to perform some TE's following HEP handout.  Instructed on proper tech, freq as well as use of ICE.   Addressed all mobility questions, discussed appropriate activity, educated on use of ICE.  Pt ready for D/C to home.   Follow Up Recommendations  Follow surgeons recommendation for DC plan and follow-up therapies;Outpatient PT     Equipment Recommendations  None recommended by PT    Recommendations for Other Services       Precautions / Restrictions Precautions Precautions: Fall;Posterior Hip Precaution Booklet Issued: Yes (comment) Precaution Comments: Pt recalls 3/3 THP without cues Restrictions Weight Bearing Restrictions: Yes RLE Weight Bearing: Partial weight bearing RLE Partial Weight Bearing Percentage or Pounds: 50%    Mobility  Bed Mobility               General bed mobility comments: OOB in recliner  Transfers Overall transfer level: Needs assistance Equipment used: Rolling walker (2 wheeled) Transfers: Sit to/from Stand Sit to Stand: Supervision         General transfer comment: no cues for LE management, adherence to THP and PWB, and use of UEs to self assist  Ambulation/Gait Ambulation/Gait assistance: Supervision Gait Distance (Feet): 145 Feet Assistive device: Rolling walker (2 wheeled) Gait Pattern/deviations: Step-to pattern;Decreased step length - right;Decreased step length - left;Decreased stance time - right Gait velocity: decr   General Gait Details: good awareness of PWB and tolerated a functional distance   Stairs             Wheelchair Mobility     Modified Rankin (Stroke Patients Only)       Balance                                            Cognition Arousal/Alertness: Awake/alert Behavior During Therapy: WFL for tasks assessed/performed Overall Cognitive Status: Within Functional Limits for tasks assessed                                 General Comments: AxO X 3 very motivated and smart      Exercises   Total Hip Replacement TE's following HEP Handout 10 reps ankle pumps 05 reps knee presses 05 reps heel slides 05 reps SAQ's 05 reps ABD Instructed how to use a belt loop to assist  Followed by ICE    General Comments        Pertinent Vitals/Pain Pain Assessment: 0-10 Pain Score: 5  Pain Location: Rt hip  Pain Descriptors / Indicators: Aching;Discomfort Pain Intervention(s): Monitored during session;Premedicated before session;Repositioned;Ice applied    Home Living                      Prior Function            PT Goals (current goals can now be found in the care plan section) Progress towards PT goals: Progressing toward goals    Frequency  7X/week       PT Plan Current plan remains appropriate    Co-evaluation              AM-PAC PT "6 Clicks" Mobility   Outcome Measure  Help needed turning from your back to your side while in a flat bed without using bedrails?: None Help needed moving from lying on your back to sitting on the side of a flat bed without using bedrails?: None Help needed moving to and from a bed to a chair (including a wheelchair)?: None Help needed standing up from a chair using your arms (e.g., wheelchair or bedside chair)?: None Help needed to walk in hospital room?: None   6 Click Score: 20    End of Session Equipment Utilized During Treatment: Gait belt Activity Tolerance: Patient tolerated treatment well Patient left: in chair;with call bell/phone within reach Nurse Communication: Mobility status PT Visit  Diagnosis: Muscle weakness (generalized) (M62.81);Difficulty in walking, not elsewhere classified (R26.2)     Time: 1105-1130 PT Time Calculation (min) (ACUTE ONLY): 25 min  Charges:  $Gait Training: 8-22 mins $Therapeutic Exercise: 8-22 mins                     Rica Koyanagi  PTA Acute  Rehabilitation Services Pager      [phone] Office      [phone]

## 2019-08-23 NOTE — Plan of Care (Signed)
  Problem: Education: Goal: Knowledge of General Education information will improve Description: Including pain rating scale, medication(s)/side effects and non-pharmacologic comfort measures Outcome: Adequate for Discharge   Problem: Health Behavior/Discharge Planning: Goal: Ability to manage health-related needs will improve Outcome: Adequate for Discharge   Problem: Clinical Measurements: Goal: Ability to maintain clinical measurements within normal limits will improve Outcome: Adequate for Discharge Goal: Will remain free from infection Outcome: Adequate for Discharge Goal: Diagnostic test results will improve Outcome: Adequate for Discharge Goal: Cardiovascular complication will be avoided Outcome: Adequate for Discharge   Problem: Activity: Goal: Risk for activity intolerance will decrease Outcome: Adequate for Discharge   Problem: Nutrition: Goal: Adequate nutrition will be maintained Outcome: Adequate for Discharge   Problem: Coping: Goal: Level of anxiety will decrease Outcome: Adequate for Discharge   Problem: Elimination: Goal: Will not experience complications related to bowel motility Outcome: Adequate for Discharge Goal: Will not experience complications related to urinary retention Outcome: Adequate for Discharge   Problem: Pain Managment: Goal: General experience of comfort will improve Outcome: Adequate for Discharge   Problem: Safety: Goal: Ability to remain free from injury will improve Outcome: Adequate for Discharge   Problem: Skin Integrity: Goal: Risk for impaired skin integrity will decrease Outcome: Adequate for Discharge   Problem: Education: Goal: Knowledge of the prescribed therapeutic regimen will improve Outcome: Adequate for Discharge Goal: Understanding of discharge needs will improve Outcome: Adequate for Discharge Goal: Individualized Educational Video(s) Outcome: Adequate for Discharge   Problem: Activity: Goal: Ability to  avoid complications of mobility impairment will improve Outcome: Adequate for Discharge Goal: Ability to tolerate increased activity will improve Outcome: Adequate for Discharge   Problem: Clinical Measurements: Goal: Postoperative complications will be avoided or minimized Outcome: Adequate for Discharge   Problem: Pain Management: Goal: Pain level will decrease with appropriate interventions Outcome: Adequate for Discharge   Problem: Skin Integrity: Goal: Will show signs of wound healing Outcome: Adequate for Discharge

## 2019-08-31 NOTE — Discharge Summary (Signed)
Physician Discharge Summary   Patient ID: Steven Blair MRN: [id number] DOB/AGE: December 28, 1945 74 y.o.  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   Admission Diagnoses:  Past Medical History:  Diagnosis Date  . Arrhythmia   . Arthritis    hands shoulders, hips  . BMI 40.0-44.9, adult (HCC)    Greater than 40 BMI  . 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 Xarelto - no longer taking  . Dysrhythmia    Atrial Fib x1 episode, cardioversion 4'16- NSR  . Fracture of trochanter of right femur ([place]) 02/2019  . History of kidney stones    x1 -20 yrs ago  . Hypertension   . Peripheral vascular disease ([place])    DVT 2014  . Ulcerative colitis ([place])    history of , some current issues at this time   Discharge Diagnoses:   Active Problems:   S/P revision of right total hip   Obese  Estimated body mass index is 32.41 kg/m as calculated from the following:   Height as of this encounter: 6' (1.829 m).   Weight as of this encounter: 108.4 kg.  Procedure:  Procedure(s) (LRB): TOTAL HIP REVISION (Right)   Consults: None  HPI: The patient is a 74 year old male with longstanding history of right total hip arthroplasty.  We have been following his hip for some time related to polyethylene wear and the orientation of his femoral stem.  He recently  began having increasing pain in his right hip.  There was some concern about a lesser trochanteric femur fracture.  However, his pain persisted.  Followup studies including bone scan indicated concern for loosening.  At this point, he was ready to  proceed with revision surgery.  The risks of infection, DVT, dislocation, neurovascular injury, need for future surgeries related to the fracture was all reviewed and consent was obtained for benefit of pain relief.  The postoperative course was reviewed as possibilities  related to the procedure performed. The possibility of a potential extended trochanteric osteotomy was reviewed and the effect on his recovery versus removing components without challenge.  Consent was obtained for benefit of pain  relief.  Laboratory Data: Admission on 08/19/2019, Discharged on 08/23/2019  Component Date Value Ref Range Status  . WBC 08/20/2019 9.3  4.0 - 10.5 K/uL Final  . RBC 08/20/2019 3.48* 4.22 - 5.81 MIL/uL Final  . Hemoglobin 08/20/2019 12.2* 13.0 - 17.0 g/dL Final  . HCT 08/20/2019 37.3* 39 - 52 % Final  . MCV 08/20/2019 107.2* 80.0 - 100.0 fL Final  . MCH 08/20/2019 35.1* 26.0 - 34.0 pg Final  . MCHC 08/20/2019 32.7  30.0 - 36.0 g/dL Final  . RDW 08/20/2019 12.1  11.5 - 15.5 % Final  . Platelets 08/20/2019 129* 150 - 400 K/uL Final  . nRBC 08/20/2019 0.0  0.0 - 0.2 % Final   Performed at [facility], [place] [address] [place], [place] [address]  . Sodium 08/20/2019 135  135 - 145 mmol/L Final  . Potassium 08/20/2019 5.2* 3.5 - 5.1 mmol/L Final  . Chloride 08/20/2019 102  98 - 111 mmol/L Final  . CO2 08/20/2019 28  22 - 32 mmol/L Final  . Glucose, Bld 08/20/2019 170* 70 - 99 mg/dL Final   Glucose reference range applies only to samples taken after fasting for at least 8 hours.  . BUN 08/20/2019  13  8 - 23 mg/dL Final  . Creatinine, Ser 08/20/2019 0.64  0.61 - 1.24 mg/dL Final  . Calcium 08/20/2019 8.2* 8.9 - 10.3 mg/dL Final  . GFR calc non Af Amer 08/20/2019 >60  >60 mL/min Final  . GFR calc Af Amer 08/20/2019 >60  >60 mL/min Final  . Anion gap 08/20/2019 5  5 - 15 Final   Performed at [facility], [place] [address], [place], [place] [address]  . WBC 08/21/2019 9.4  4.0 - 10.5 K/uL Final  . RBC 08/21/2019 3.15* 4.22 - 5.81 MIL/uL Final  . Hemoglobin 08/21/2019 11.3* 13.0 - 17.0 g/dL Final  . HCT 08/21/2019 33.6* 39 - 52 % Final  . MCV 08/21/2019 106.7* 80.0 - 100.0 fL Final  . MCH 08/21/2019 35.9* 26.0 - 34.0 pg Final    . MCHC 08/21/2019 33.6  30.0 - 36.0 g/dL Final  . RDW 08/21/2019 12.4  11.5 - 15.5 % Final  . Platelets 08/21/2019 121* 150 - 400 K/uL Final   Comment: REPEATED TO VERIFY Immature Platelet Fraction may be clinically indicated, consider ordering this additional test [id number]   . nRBC 08/21/2019 0.0  0.0 - 0.2 % Final   Performed at [place] [address], [place], [place] [address]  . Sodium 08/21/2019 138  135 - 145 mmol/L Final  . Potassium 08/21/2019 4.5  3.5 - 5.1 mmol/L Final  . Chloride 08/21/2019 103  98 - 111 mmol/L Final  . CO2 08/21/2019 30  22 - 32 mmol/L Final  . Glucose, Bld 08/21/2019 140* 70 - 99 mg/dL Final   Glucose reference range applies only to samples taken after fasting for at least 8 hours.  . BUN 08/21/2019 16  8 - 23 mg/dL Final  . Creatinine, Ser 08/21/2019 0.59* 0.61 - 1.24 mg/dL Final  . Calcium 08/21/2019 8.6* 8.9 - 10.3 mg/dL Final  . GFR calc non Af Amer 08/21/2019 >60  >60 mL/min Final  . GFR calc Af Amer 08/21/2019 >60  >60 mL/min Final  . Anion gap 08/21/2019 5  5 - 15 Final   Performed at [facility], [place] [address], [place], [place] [address]  Hospital Outpatient Visit on 08/16/2019  Component Date Value Ref Range Status  . SARS Coronavirus 2 08/16/2019 NEGATIVE  NEGATIVE Final   Comment: (NOTE) SARS-CoV-2 target nucleic acids are NOT DETECTED.  The SARS-CoV-2 RNA is generally detectable in upper and lower respiratory specimens during the acute phase of infection. Negative results do not preclude SARS-CoV-2 infection, do not rule out co-infections with other pathogens, and should not be used as the sole basis for treatment or other patient management decisions. Negative results must be combined with clinical observations, patient history, and epidemiological information. The expected result is Negative.  Fact Sheet for Patients: SugarRoll.be  Fact Sheet for  Healthcare Providers: https://www.woods-mathews.com/  This test is not yet approved or cleared by the [place] FDA and  has been authorized for detection and/or diagnosis of SARS-CoV-2 by FDA under an Emergency Use Authorization (EUA). This EUA will remain  in effect (meaning this test can be used) for the duration of the COVID-19 declaration under Se                          ction 564(b)(1) of the Act, 21 U.S.C. section 360bbb-3(b)(1), unless the authorization is terminated or revoked sooner.  Performed at [facility], [place] [address],  [place], [place] [address]   Hospital Outpatient Visit on 08/09/2019  Component Date Value Ref Range Status  . ABO/RH(D) 08/09/2019 O POS   Final  . Antibody Screen 08/09/2019 NEG   Final  . Sample Expiration 08/09/2019 08/22/2019,2359   Final  . Extend sample reason 08/09/2019    Final                   Value:NO TRANSFUSIONS OR PREGNANCY IN THE PAST 3 MONTHS Performed at [facility], [place] [address], [place], [place] [address]   . Sodium 08/09/2019 139  135 - 145 mmol/L Final  . Potassium 08/09/2019 4.1  3.5 - 5.1 mmol/L Final  . Chloride 08/09/2019 104  98 - 111 mmol/L Final  . CO2 08/09/2019 26  22 - 32 mmol/L Final  . Glucose, Bld 08/09/2019 99  70 - 99 mg/dL Final   Glucose reference range applies only to samples taken after fasting for at least 8 hours.  . BUN 08/09/2019 19  8 - 23 mg/dL Final  . Creatinine, Ser 08/09/2019 0.83  0.61 - 1.24 mg/dL Final  . Calcium 08/09/2019 9.1  8.9 - 10.3 mg/dL Final  . GFR calc non Af Amer 08/09/2019 >60  >60 mL/min Final  . GFR calc Af Amer 08/09/2019 >60  >60 mL/min Final  . Anion gap 08/09/2019 9  5 - 15 Final   Performed at [facility], [place] [address] [place], [place] [address]  . WBC 08/09/2019 6.6  4.0 - 10.5 K/uL Final  . RBC 08/09/2019 4.34  4.22 - 5.81 MIL/uL Final  . Hemoglobin 08/09/2019 15.3  13.0 - 17.0 g/dL Final  . HCT  08/09/2019 46.0  39 - 52 % Final  . MCV 08/09/2019 106.0* 80.0 - 100.0 fL Final  . MCH 08/09/2019 35.3* 26.0 - 34.0 pg Final  . MCHC 08/09/2019 33.3  30.0 - 36.0 g/dL Final  . RDW 08/09/2019 12.1  11.5 - 15.5 % Final  . Platelets 08/09/2019 200  150 - 400 K/uL Final  . nRBC 08/09/2019 0.0  0.0 - 0.2 % Final   Performed at [facility], [place] [address] [place], [place] [address]  . MRSA, PCR 08/09/2019 NEGATIVE  NEGATIVE Final  . Staphylococcus aureus 08/09/2019 NEGATIVE  NEGATIVE Final   Comment: (NOTE) The Xpert SA Assay (FDA approved for NASAL specimens in patients 17 years of age and older), is one component of a comprehensive surveillance program. It is not intended to diagnose infection nor to guide or monitor treatment. Performed at [facility], [place] [address], [place],  [address]      X-Rays:DG Pelvis Portable  Result Date: 08/19/2019 CLINICAL DATA:  Status post hip revision surgery. EXAM: PORTABLE PELVIS 1-2 VIEWS COMPARISON:  CT 03/01/2019.  Hip series 03/01/2019. FINDINGS: Total right hip replacement revision. Hardware intact. Anatomic alignment. Prior total left hip replacement. Plate and screw fixation left femur. Degenerative changes lumbar spine. Diffuse osteopenia. Peripheral vascular calcification. IMPRESSION: Revision total right hip replacement. Hardware intact. Anatomic alignment. Electronically Signed   By: Marcello Moores  Register   On: 08/19/2019 12:19   DG FEMUR PORT, MIN 2 VIEWS RIGHT  Result Date: 08/19/2019 CLINICAL DATA:  74 year old male status post revision of right hip replacement. EXAM: RIGHT FEMUR PORTABLE 2 VIEW COMPARISON:  Pelvic radiograph dated 03/01/2019. FINDINGS: There is a total right hip arthroplasty. The arthroplasty components appear intact and in anatomic alignment. There is no acute fracture or dislocation. Postsurgical changes of  the soft tissues of the right hip. IMPRESSION: Status post total right hip  arthroplasty. Electronically Signed   By: Anner Crete M.D.   On: 08/19/2019 19:39    EKG: Orders placed or performed in visit on 07/21/19  . EKG 12-Lead     Hospital Course: Steven Blair is a 74 y.o. who was admitted to [facility]. They were brought to the operating room on 08/19/2019 and underwent Procedure(s): TOTAL HIP REVISION.  Patient tolerated the procedure well and was later transferred to the recovery room and then to the orthopaedic floor for postoperative care. They were given PO and IV analgesics for pain control following their surgery. They were given 24 hours of postoperative antibiotics of  Anti-infectives (From admission, onward)   Start     Dose/Rate Route Frequency Ordered Stop   08/19/19 1500  ceFAZolin (ANCEF) IVPB 2g/100 mL premix        2 g 200 mL/hr over 30 Minutes Intravenous Every 6 hours 08/19/19 1326 08/19/19 2032   08/19/19 0630  ceFAZolin (ANCEF) IVPB 2g/100 mL premix        2 g 200 mL/hr over 30 Minutes Intravenous On call to O.R. 08/19/19 2774 08/19/19 0858     and started on DVT prophylaxis in the form of Eliquis.   PT and OT were ordered for total joint protocol. Discharge planning consulted to help with postop disposition and equipment needs. Patient had a good night on the evening of surgery. They started to get up OOB with therapy on POD #0. Worked with therapy on POD #1 and was progressing well. Continued to work with therapy into POD #2. He had many stairs at home, and was working towards safe discharge. He was seen on POD #3 and was doing well. Ultimately, he was not able to safely ambulate the big staircase, and it was felt best that he stay another night to continue working with therapy. Pt was seen during rounds on day four and was ready to go home pending progress with therapy.Pt worked with therapy for one additional session and was meeting their goals. He was discharged to home later that day in stable condition.  Diet: Regular  diet Activity: PWB Follow-up: in 2 weeks Disposition: Home Discharged Condition: good   Discharge Instructions    Call MD / Call 911   Complete by: As directed    If you experience chest pain or shortness of breath, CALL 911 and be transported to the hospital emergency room.  If you develope a fever above 101 F, pus (white drainage) or increased drainage or redness at the wound, or calf pain, call your surgeon's office.   Change dressing   Complete by: As directed    Maintain surgical dressing until follow up in the clinic. If the edges start to pull up, may reinforce with tape. If the dressing is no longer working, may remove and cover with gauze and tape, but must keep the area dry and clean.  Call with any questions or concerns.   Constipation Prevention   Complete by: As directed    Drink plenty of fluids.  Prune juice may be helpful.  You may use a stool softener, such as Colace (over the counter) 100 mg twice a day.  Use MiraLax (over the counter) for constipation as needed.   Diet - low sodium heart healthy   Complete by: As directed    Discharge instructions   Complete by: As directed    Maintain surgical  dressing until follow up in the clinic. If the edges start to pull up, may reinforce with tape. If the dressing is no longer working, may remove and cover with gauze and tape, but must keep the area dry and clean.  Follow up in 2 weeks at [facility]. Call with any questions or concerns.   Follow the hip precautions as taught in Physical Therapy   Complete by: As directed    Increase activity slowly as tolerated   Complete by: As directed    Partial weight bearing with assist device as directed.  Full weightbearing on stairs.   TED hose   Complete by: As directed    Use stockings (TED hose) for 2 weeks on both leg(s).  You may remove them at night for sleeping.     Allergies as of 08/23/2019      Reactions   Sulfa Antibiotics Other (See Comments)   Childhood allergy       Medication List    STOP taking these medications   acetaminophen 500 MG tablet Commonly known as: TYLENOL     TAKE these medications   apixaban 5 MG Tabs tablet Commonly known as: Eliquis Take 1 tablet (5 mg total) by mouth 2 (two) times daily.   diltiazem 300 MG 24 hr capsule Commonly known as: CARDIZEM CD TAKE 1 CAPSULE(300 MG) BY MOUTH DAILY   diltiazem 300 MG 24 hr capsule Commonly known as: CARDIZEM CD TAKE 1 CAPSULE(300 MG) BY MOUTH DAILY   docusate sodium 100 MG capsule Commonly known as: COLACE Take 1 capsule (100 mg total) by mouth 2 (two) times daily.   ferrous sulfate 325 (65 FE) MG tablet Take 1 tablet (325 mg total) by mouth 3 (three) times daily after meals for 10 days.   HYDROcodone-acetaminophen 5-325 MG tablet Commonly known as: NORCO/VICODIN Take 1-2 tablets by mouth every 4 (four) hours as needed for moderate pain (pain score 4-6). Notes to patient: Pain   mesalamine 1.2 g EC tablet Commonly known as: LIALDA Take 2.4 g by mouth daily.   methocarbamol 500 MG tablet Commonly known as: ROBAXIN Take 1 tablet (500 mg total) by mouth every 6 (six) hours as needed for muscle spasms.   Vitamin D3 50 MCG (2000 UT) capsule Take 2,000 Units by mouth daily.            Discharge Care Instructions  (From admission, onward)         Start     Ordered   08/23/19 0000  Change dressing       Comments: Maintain surgical dressing until follow up in the clinic. If the edges start to pull up, may reinforce with tape. If the dressing is no longer working, may remove and cover with gauze and tape, but must keep the area dry and clean.  Call with any questions or concerns.   08/23/19 8850          Follow-up Information    Paralee Cancel, MD. Schedule an appointment as soon as possible for a visit in 2 weeks.   Specialty: Orthopedic Surgery Contact information: [address] [place] [address] [phone]                Signed: Griffith Citron, PA-C Orthopedic Surgery 08/31/2019, 9:34 AM

## 2020-01-25 ENCOUNTER — Other Ambulatory Visit (HOSPITAL_COMMUNITY): Payer: Self-pay | Admitting: Physician Assistant

## 2020-01-26 ENCOUNTER — Other Ambulatory Visit (HOSPITAL_COMMUNITY): Payer: Self-pay | Admitting: Physician Assistant

## 2020-01-30 NOTE — Progress Notes (Signed)
Virtual Visit via Telephone Note   This visit type was conducted due to national recommendations for restrictions regarding the COVID-19 Pandemic (e.g. social distancing) in an effort to limit this patient's exposure and mitigate transmission in our community.  Due to his co-morbid illnesses, this patient is at least at moderate risk for complications without adequate follow up.  This format is felt to be most appropriate for this patient at this time.  The patient did not have access to video technology/had technical difficulties with video requiring transitioning to audio format only (telephone).  All issues noted in this document were discussed and addressed.  No physical exam could be performed with this format.  Please refer to the patient's chart for his  consent to telehealth for [facility].    Date:  01/31/2020   ID:  Steven Blair, DOB [birth year], MRN [id number] The patient was identified using 2 identifiers.  Patient Location: Home Provider Location: Office/Clinic  PCP:  Josetta Huddle, MD  Cardiologist:  No primary care provider on file.  Electrophysiologist:  None   Evaluation Performed:  Follow-Up Visit  Chief Complaint:  Medications  History of Present Illness:    Steven Blair is a 75 y.o. male with UC, HLD, HTN, permanent Afib (failed DCCV x 1 and decided on rate control) who presents for follow-up. No issues with BP. Walking 60 min per day, 6 days per week. No CP, SOB, or palpitations. Desires to come off diltiazem and Eliquis.  I did explain to him that the diltiazem helps his heart rate from going too fast.  He reports that there are no issues taking it when he is at home.  We did discuss going down to the lowest dose of diltiazem which she is in agreement to do.  I would like for him to have some calcium channel blocker on board to avoid any rapid heartbeats.  We also discussed that anticoagulation is indicated.  It appears that Eliquis is quite costly.   Unfortunately, he needs to be on a blood thinner to reduce his risk of stroke.  His chads Vascor is 2 and anticoagulation is indicated.  I do not want him to have a stroke.  He has no bleeding issues and there is no indication for left atrial appendage occlusion.  This may become available widespread in the next 2 years and we did discuss this but right now it is not available to him.  He does understand the need for these medications and will look for ways to reduce his cost of Eliquis.  He is doing quite well without complaints today.  Problem List 1. Ulcerative colitis 2. HTN 3. HLD 4. Permanent Afib -dx 09/2018 -DCCV 11/06/2018 with return of Afib -evaluated 12/2018 in Afib clinic and decided on rate control  -CHADSVASC = 2 (age, HTN) 5. OSA  The patient does not have symptoms concerning for COVID-19 infection (fever, chills, cough, or new shortness of breath).    Past Medical History:  Diagnosis Date  . Arrhythmia   . Arthritis    hands shoulders, hips  . BMI 40.0-44.9, adult (HCC)    Greater than 40 BMI  . 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 Xarelto - no longer taking  . Dysrhythmia    Atrial Fib x1 episode, cardioversion 4'16- NSR  . Fracture of trochanter of right femur ([place]) 02/2019  . History of kidney stones    x1 -20 yrs ago  . Hypertension   .  Peripheral vascular disease ([place])    DVT 2014  . Ulcerative colitis ([place])    history of , some current issues at this time   Past Surgical History:  Procedure Laterality Date  . CARDIOVERSION     tx Paroxysmal Atrial Fibrillation- converted to NSR  . CARDIOVERSION N/A 11/06/2018   Procedure: CARDIOVERSION;  Surgeon: Donato Heinz, MD;  Location: [facility] ENDOSCOPY;  Service: Endoscopy;  Laterality: N/A;  . COLONOSCOPY W/ POLYPECTOMY    . EYE SURGERY Right 2016  . FEMUR SURGERY Left    ORIF 10'05  . FLEXIBLE SIGMOIDOSCOPY N/A 11/01/2014   Procedure: FLEXIBLE SIGMOIDOSCOPY;  Surgeon: Garlan Fair, MD;  Location: [facility] ENDOSCOPY;  Service: Endoscopy;  Laterality: N/A;  . JOINT REPLACEMENT     BTHA  . TOTAL HIP REVISION Right 08/19/2019   Procedure: TOTAL HIP REVISION;  Surgeon: Paralee Cancel, MD;  Location: [facility] ORS;  Service: Orthopedics;  Laterality: Right;  2 hrs     Current Meds  Medication Sig  . Cholecalciferol (VITAMIN D3) 50 MCG (2000 UT) capsule Take 2,000 Units by mouth daily.  Marland Kitchen diltiazem (CARDIZEM CD) 120 MG 24 hr capsule Take 1 capsule (120 mg total) by mouth daily.  . mesalamine (LIALDA) 1.2 g EC tablet Take 2.4 g by mouth daily.  . [DISCONTINUED] diltiazem (CARDIZEM CD) 300 MG 24 hr capsule TAKE 1 CAPSULE(300 MG) BY MOUTH DAILY  . [DISCONTINUED] ELIQUIS 5 MG TABS tablet TAKE 1 TABLET(5 MG) BY MOUTH TWICE DAILY     Allergies:   Sulfa antibiotics   Social History   Tobacco Use  . Smoking status: Never Smoker  . Smokeless tobacco: Never Used  Vaping Use  . Vaping Use: Never used  Substance Use Topics  . Alcohol use: Yes    Alcohol/week: 3.0 standard drinks    Types: 1 Glasses of wine, 2 Cans of beer per week    Comment: weekend  . Drug use: No     Family Hx: The patient's family history includes Lung cancer in his brother.  ROS:   Please see the history of present illness.     All other systems reviewed and are negative.   Prior CV studies:   The following studies were reviewed today:  [place] 05/21/2019 Persistent atrial fibrillation (100% burden) Average V rate 101 bpm Frequent RVR No AV block or pauses No sustained ventricular arrhythmias   TTE 12/03/2018 1. Left ventricular ejection fraction, by visual estimation, is 45 to  50%. The left ventricle has mildly decreased function. There is mildly  increased left ventricular hypertrophy.  2. Left ventricular diastolic parameters are indeterminate.  3. Global right ventricle has normal systolic function.The right  ventricular size is mildly enlarged. No increase in right ventricular wall   thickness.  4. Left atrial size was severely dilated.  5. Right atrial size was mildly dilated.  6. The mitral valve is normal in structure. No evidence of mitral valve  regurgitation.  7. The tricuspid valve is normal in structure. Tricuspid valve  regurgitation is not demonstrated.  8. The aortic valve is tricuspid. Aortic valve regurgitation is not  visualized. No evidence of aortic valve sclerosis or stenosis.  9. The pulmonic valve was not well visualized. Pulmonic valve  regurgitation is not visualized.  10. There is mild dilatation of the aortic root measuring 40 mm.  11. The inferior vena cava is normal in size with greater than 50%  respiratory variability, suggesting right atrial pressure of 3 mmHg.  Labs/Other Tests and Data Reviewed:    EKG:  No ECG reviewed.  Recent Labs: 08/21/2019: BUN 16; Creatinine, Ser 0.59; Hemoglobin 11.3; Platelets 121; Potassium 4.5; Sodium 138   Recent Lipid Panel No results found for: CHOL, TRIG, HDL, CHOLHDL, LDLCALC, LDLDIRECT  Wt Readings from Last 3 Encounters:  01/31/20 240 lb (108.9 kg)  08/19/19 (!) 239 lb (108.4 kg)  08/09/19 239 lb (108.4 kg)    Objective:    Vital Signs:  BP 121/69   Pulse 82   Temp 97.9 F (36.6 C)   Ht 6' (1.829 m)   Wt 240 lb (108.9 kg)   BMI 32.55 kg/m    VITAL SIGNS:  reviewed  General: No acute distress by phone Pulmonary: Talking complete sentences, no shortness of breath Psych: Normal mood and affect  ASSESSMENT & PLAN:    1. Permanent atrial fibrillation (HCC) -CHADSVASC=2 (age, HTN) -He has failed cardioversion.  Remains in permanent A. fib.  No symptoms.  Average heart rate on monitor 101 bpm.  Echo with mildly reduced EF 45-50%. -No symptoms of his A. fib or heart failure.  We did discuss that he needs to stay on a calcium channel blocker.  We will reduce the dose to 120 mg extended release daily.  He has no symptoms and is exercising large amount.  No issues. -We also discussed  that anticoagulation is indicated.  It appears Eliquis is a cost issue.  I recommended he remain on a blood thinner.  This could be Coumadin or Eliquis or Xarelto.  Unfortunately, Xarelto or Eliquis will be costly for him.  He reports he would like to stay on these despite the cost.  He will look for ways around this.  2. Primary hypertension -Well-controlled on diltiazem.  3. Mixed hyperlipidemia -Controlled with diet and exercise.  COVID-19 Education: The signs and symptoms of COVID-19 were discussed with the patient and how to seek care for testing (follow up with PCP or arrange E-visit).  The importance of social distancing was discussed today.  Time:  Today, I have spent 25 minutes with the patient with telehealth technology discussing the above problems.     Medication Adjustments/Labs and Tests Ordered: Current medicines are reviewed at length with the patient today.  Concerns regarding medicines are outlined above.   Tests Ordered: No orders of the defined types were placed in this encounter.  Medication Changes: Meds ordered this encounter  Medications  . apixaban (ELIQUIS) 5 MG TABS tablet    Sig: TAKE 1 TABLET(5 MG) BY MOUTH TWICE DAILY    Dispense:  180 tablet    Refill:  3  . diltiazem (CARDIZEM CD) 120 MG 24 hr capsule    Sig: Take 1 capsule (120 mg total) by mouth daily.    Dispense:  90 capsule    Refill:  3    Follow Up:  In Person in 6 month(s)  Signed, Evalina Field, MD  01/31/2020 2:47 PM    [place]

## 2020-01-31 ENCOUNTER — Telehealth (INDEPENDENT_AMBULATORY_CARE_PROVIDER_SITE_OTHER): Payer: No Typology Code available for payment source | Admitting: Cardiovascular Disease

## 2020-01-31 ENCOUNTER — Encounter: Payer: Self-pay | Admitting: Cardiovascular Disease

## 2020-01-31 VITALS — BP 121/69 | HR 82 | Temp 97.9°F | Ht 72.0 in | Wt 240.0 lb

## 2020-01-31 DIAGNOSIS — E782 Mixed hyperlipidemia: Secondary | ICD-10-CM | POA: Diagnosis not present

## 2020-01-31 DIAGNOSIS — I10 Essential (primary) hypertension: Secondary | ICD-10-CM | POA: Diagnosis not present

## 2020-01-31 DIAGNOSIS — I4821 Permanent atrial fibrillation: Secondary | ICD-10-CM

## 2020-01-31 MED ORDER — APIXABAN 5 MG PO TABS: ORAL_TABLET | ORAL | 3 refills | Status: DC

## 2020-01-31 MED ORDER — DILTIAZEM HCL ER COATED BEADS 120 MG PO CP24
120.0000 mg | ORAL_CAPSULE | Freq: Every day | ORAL | 3 refills | Status: DC
Start: 2020-01-31 — End: 2021-01-18

## 2020-01-31 NOTE — Patient Instructions (Signed)
Medication Instructions:  Your physician recommends that you continue on your current medications as directed. Please refer to the Current Medication list given to you today.  *If you need a refill on your cardiac medications before your next appointment, please call your pharmacy*  Follow-Up: At [facility], you and your health needs are our priority.  As part of our continuing mission to provide you with exceptional heart care, we have created designated Provider Care Teams.  These Care Teams include your primary Cardiologist (physician) and Advanced Practice Providers (APPs -  Physician Assistants and Nurse Practitioners) who all work together to provide you with the care you need, when you need it.  We recommend signing up for the patient portal called "MyChart".  Sign up information is provided on this After Visit Summary.  MyChart is used to connect with patients for Virtual Visits (Telemedicine).  Patients are able to view lab/test results, encounter notes, upcoming appointments, etc.  Non-urgent messages can be sent to your provider as well.   To learn more about what you can do with MyChart, go to [email or web address].    Your next appointment:   6 month(s)  The format for your next appointment:   In Person  Provider:   You may see Dr. Audie Box or one of the following Advanced Practice Providers on your designated Care Team:    Almyra Deforest, PA-C  Fabian Sharp, [place] or   Roby Lofts, [place]    Other Instructions  Eliquis Patient Assistance Application: [email or web address]

## 2020-10-09 IMAGING — NM NM BONE 3 PHASE
1 series · 6 of 6 positions shown · non-contrast
Comparison: None.

CLINICAL DATA: Right total hip replacement 25 years ago. Pain after
fall. Left total hip replacement 15 years ago. Swelling in right
ankle.

EXAM:
NUCLEAR MEDICINE 3-PHASE BONE SCAN
TECHNIQUE: Radionuclide angiographic images, immediate static blood pool
images, and 3-hour delayed static images were obtained of the the
hips after intravenous injection of radiopharmaceutical.
RADIOPHARMACEUTICALS:  Twenty-one mCi 4c-RRm MDP IV

[Series 1: flow · 2.07mm/px · 6 of 48 frames shown]
[frame 5/48  full-range]
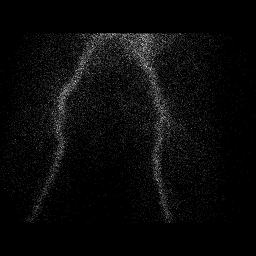
[frame 13/48  full-range]
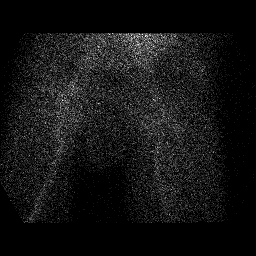
[frame 21/48  full-range]
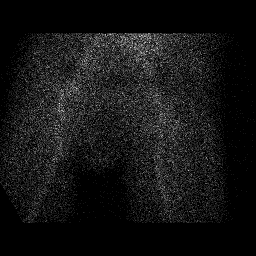
[frame 29/48  full-range]
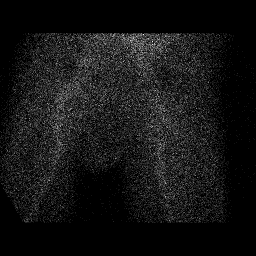
[frame 37/48  full-range]
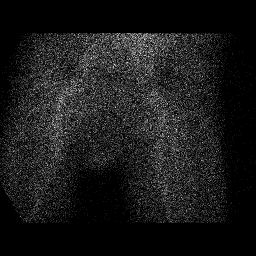
[frame 45/48  full-range]
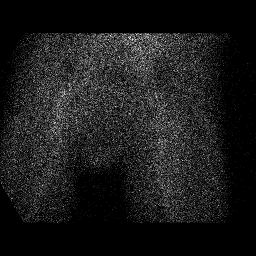

[6 of 6 positions shown; findings below may reference images not displayed]

FINDINGS: Vascular phase: Normal

Blood pool phase: Normal

Delayed phase: Significant uptake around the femoral component on
the right, little more focal distally based on lateral views. Very
mild uptake near the distal aspect of the left femoral component,
not particularly focal.
IMPRESSION: 1. Diffuse uptake around the right femoral component, a little more
focal at the distal tip, is most consistent with loosening given
history.
2. Mild uptake at the level of the distal left femoral component is
not particularly focal. This uptake is at site of the fracture noted
on x-rays from October 18, 2017. The overall ill-defined nature of
the uptake suggest this may be uptake in a previous fracture rather
than due to loosening.

## 2020-12-19 NOTE — Progress Notes (Signed)
Cardiology Office Note:   Date:  12/20/2020  NAME:  Steven Blair    MRN: [id number] DOB:  02/10/1945   PCP:  Josetta Huddle, MD  Cardiologist:  None  Electrophysiologist:  None   Referring MD: Josetta Huddle, MD   Chief Complaint  Patient presents with   Follow-up    6 months.    History of Present Illness:   Steven Blair is a 75 y.o. male with a hx of permament Afib, UC, HTN, HLD, OSA who presents for follow-up.  He reports he is doing well.  Denies any chest pain or trouble breathing.  Blood pressure 122/70.  Pulse 96.  EKG confirms he is still in atrial fibrillation.  He informs me he is walking 5 to 6 days/week up to 50 to 60 minutes.  No chest pain or trouble breathing with this level of activity.  Overall he is doing well.  His ulcerative colitis is well controlled.  No bleeding on Eliquis.  Denies symptoms in office today.  Problem List 1. Ulcerative colitis 2. HTN 3. HLD 4. Permanent Afib -dx 09/2018 -DCCV 11/06/2018 with return of Afib -evaluated 12/2018 in Afib clinic and decided on rate control  -CHADSVASC = 2 (age, HTN) 5. OSA  Past Medical History: Past Medical History:  Diagnosis Date   Arrhythmia    Arthritis    hands shoulders, hips   BMI 40.0-44.9, adult (HCC)    Greater than 40 BMI   DVT of leg (deep venous thrombosis) (HCC)    left leg tx. Xarelto - no longer taking   Dysrhythmia    Atrial Fib x1 episode, cardioversion 4'16- NSR   Fracture of trochanter of right femur ([place]) 02/2019   History of kidney stones    x1 -20 yrs ago   Hypertension    Peripheral vascular disease ([place])    DVT 2014   Ulcerative colitis ([place])    history of , some current issues at this time    Past Surgical History: Past Surgical History:  Procedure Laterality Date   CARDIOVERSION     tx Paroxysmal Atrial Fibrillation- converted to NSR   CARDIOVERSION N/A 11/06/2018   Procedure: CARDIOVERSION;  Surgeon: Donato Heinz, MD;  [facility] ENDOSCOPY;   Service: Endoscopy;  Laterality: N/A;   COLONOSCOPY W/ POLYPECTOMY     EYE SURGERY Right 2016   FEMUR SURGERY Left    ORIF 10'05   FLEXIBLE SIGMOIDOSCOPY N/A 11/01/2014   Procedure: FLEXIBLE SIGMOIDOSCOPY;  Surgeon: Garlan Fair, MD;  Location: [facility] ENDOSCOPY;  Service: Endoscopy;  Laterality: N/A;   JOINT REPLACEMENT     BTHA   TOTAL HIP REVISION Right 08/19/2019   Procedure: TOTAL HIP REVISION;  Surgeon: Paralee Cancel, MD;  Location: [facility] ORS;  Service: Orthopedics;  Laterality: Right;  2 hrs    Current Medications: Current Meds  Medication Sig   apixaban (ELIQUIS) 5 MG TABS tablet TAKE 1 TABLET(5 MG) BY MOUTH TWICE DAILY   Cholecalciferol (VITAMIN D3) 50 MCG (2000 UT) capsule Take 2,000 Units by mouth daily.   diltiazem (CARDIZEM CD) 120 MG 24 hr capsule Take 1 capsule (120 mg total) by mouth daily.   mesalamine (LIALDA) 1.2 g EC tablet Take 2.4 g by mouth daily.     Allergies:    Sulfa antibiotics   Social History: Social History   Socioeconomic History   Marital status: Married    Spouse name: Not on file   Number of children: Not on file   Years  of education: Not on file   Highest education level: Not on file  Occupational History   Not on file  Tobacco Use   Smoking status: Never   Smokeless tobacco: Never  Vaping Use   Vaping Use: Never used  Substance and Sexual Activity   Alcohol use: Yes    Alcohol/week: 3.0 standard drinks    Types: 1 Glasses of wine, 2 Cans of beer per week    Comment: weekend   Drug use: No   Sexual activity: Not on file  Other Topics Concern   Not on file  Social History Narrative   Not on file   Social Determinants of Health   Financial Resource Strain: Not on file  Food Insecurity: Not on file  Transportation Needs: Not on file  Physical Activity: Not on file  Stress: Not on file  Social Connections: Not on file     Family History: The patient'sfamily history includes Lung cancer in his brother.  ROS:   All other ROS  reviewed and negative. Pertinent positives noted in the HPI.     EKGs/Labs/Other Studies Reviewed:   The following studies were personally reviewed by me today:  EKG:  EKG is ordered today.  The ekg ordered today demonstrates atrial fibrillation heart rate 96, left axis deviation, and was personally reviewed by me.   TTE 12/03/2018  1. Left ventricular ejection fraction, by visual estimation, is 45 to  50%. The left ventricle has mildly decreased function. There is mildly  increased left ventricular hypertrophy.   2. Left ventricular diastolic parameters are indeterminate.   3. Global right ventricle has normal systolic function.The right  ventricular size is mildly enlarged. No increase in right ventricular wall  thickness.   4. Left atrial size was severely dilated.   5. Right atrial size was mildly dilated.   6. The mitral valve is normal in structure. No evidence of mitral valve  regurgitation.   7. The tricuspid valve is normal in structure. Tricuspid valve  regurgitation is not demonstrated.   8. The aortic valve is tricuspid. Aortic valve regurgitation is not  visualized. No evidence of aortic valve sclerosis or stenosis.   9. The pulmonic valve was not well visualized. Pulmonic valve  regurgitation is not visualized.  10. There is mild dilatation of the aortic root measuring 40 mm.  11. The inferior vena cava is normal in size with greater than 50%  respiratory variability, suggesting right atrial pressure of 3 mmHg.    Recent Labs: No results found for requested labs within last 8760 hours.   Recent Lipid Panel No results found for: CHOL, TRIG, HDL, CHOLHDL, VLDL, LDLCALC, LDLDIRECT  Physical Exam:   VS:  BP 122/70 (BP Location: Right Arm, Patient Position: Sitting, Cuff Size: Normal)   Pulse 96   Ht 6' (1.829 m)   Wt 260 lb (117.9 kg)   BMI 35.26 kg/m    Wt Readings from Last 3 Encounters:  12/20/20 260 lb (117.9 kg)  01/31/20 240 lb (108.9 kg)  08/19/19 (!)  239 lb (108.4 kg)    General: Well nourished, well developed, in no acute distress Head: Atraumatic, normal size  Eyes: PEERLA, EOMI  Neck: Supple, no JVD Endocrine: No thryomegaly Cardiac: Normal S1, S2; irregular rhythm, no murmurs rubs or gallops Lungs: Clear to auscultation bilaterally, no wheezing, rhonchi or rales  Abd: Soft, nontender, no hepatomegaly  Ext: No edema, pulses 2+ Musculoskeletal: No deformities, BUE and BLE strength normal and equal Skin: Warm and  dry, no rashes   Neuro: Alert and oriented to person, place, time, and situation, CNII-XII grossly intact, no focal deficits  Psych: Normal mood and affect   ASSESSMENT:   SHEAN GERDING is a 75 y.o. male who presents for the following: 1. Permanent atrial fibrillation ([place])   2. Primary hypertension   3. Mixed hyperlipidemia     PLAN:   1. Permanent atrial fibrillation (HCC) -CHADSVASC=3 (age, HTN). On eliquis no bleeding.  -rate control strategy preferred given no symptoms. Echo with low normal EF ~50%.  -He continues to have no symptoms.  Would continue with diltiazem 120 mg daily for rate control strategy.  Tolerating anticoagulation well.  No issues.  2. Primary hypertension -No change to medications.  3. Mixed hyperlipidemia -No change to medications  Disposition: Return in about 1 year (around 12/20/2021).  Medication Adjustments/Labs and Tests Ordered: Current medicines are reviewed at length with the patient today.  Concerns regarding medicines are outlined above.  Orders Placed This Encounter  Procedures   EKG 12-Lead   No orders of the defined types were placed in this encounter.   Patient Instructions  Medication Instructions:  The current medical regimen is effective;  continue present plan and medications.  *If you need a refill on your cardiac medications before your next appointment, please call your pharmacy*   Follow-Up: At [facility], you and your health needs are our  priority.  As part of our continuing mission to provide you with exceptional heart care, we have created designated Provider Care Teams.  These Care Teams include your primary Cardiologist (physician) and Advanced Practice Providers (APPs -  Physician Assistants and Nurse Practitioners) who all work together to provide you with the care you need, when you need it.  We recommend signing up for the patient portal called "MyChart".  Sign up information is provided on this After Visit Summary.  MyChart is used to connect with patients for Virtual Visits (Telemedicine).  Patients are able to view lab/test results, encounter notes, upcoming appointments, etc.  Non-urgent messages can be sent to your provider as well.   To learn more about what you can do with MyChart, go to [email or web address].    Your next appointment:   12 month(s)  The format for your next appointment:   In Person  Provider:   Sande Rives, PA-C or Almyra Deforest, PA-C      Time Spent with Patient: I have spent a total of 25 minutes with patient reviewing hospital notes, telemetry, EKGs, labs and examining the patient as well as establishing an assessment and plan that was discussed with the patient.  > 50% of time was spent in direct patient care.  Signed, Addison Naegeli. Audie Box, MD, [place]  [address] [place], [place] [address] [phone]  12/20/2020 1:28 PM

## 2020-12-20 ENCOUNTER — Encounter: Payer: Self-pay | Admitting: Cardiovascular Disease

## 2020-12-20 ENCOUNTER — Ambulatory Visit (INDEPENDENT_AMBULATORY_CARE_PROVIDER_SITE_OTHER): Payer: No Typology Code available for payment source | Admitting: Cardiovascular Disease

## 2020-12-20 ENCOUNTER — Other Ambulatory Visit: Payer: Self-pay

## 2020-12-20 VITALS — BP 122/70 | HR 96 | Ht 72.0 in | Wt 260.0 lb

## 2020-12-20 DIAGNOSIS — I10 Essential (primary) hypertension: Secondary | ICD-10-CM | POA: Diagnosis not present

## 2020-12-20 DIAGNOSIS — E782 Mixed hyperlipidemia: Secondary | ICD-10-CM | POA: Diagnosis not present

## 2020-12-20 DIAGNOSIS — I4821 Permanent atrial fibrillation: Secondary | ICD-10-CM

## 2020-12-20 NOTE — Patient Instructions (Signed)
Medication Instructions:  The current medical regimen is effective;  continue present plan and medications.  *If you need a refill on your cardiac medications before your next appointment, please call your pharmacy*   Follow-Up: At [facility], you and your health needs are our priority.  As part of our continuing mission to provide you with exceptional heart care, we have created designated Provider Care Teams.  These Care Teams include your primary Cardiologist (physician) and Advanced Practice Providers (APPs -  Physician Assistants and Nurse Practitioners) who all work together to provide you with the care you need, when you need it.  We recommend signing up for the patient portal called "MyChart".  Sign up information is provided on this After Visit Summary.  MyChart is used to connect with patients for Virtual Visits (Telemedicine).  Patients are able to view lab/test results, encounter notes, upcoming appointments, etc.  Non-urgent messages can be sent to your provider as well.   To learn more about what you can do with MyChart, go to [email or web address].    Your next appointment:   12 month(s)  The format for your next appointment:   In Person  Provider:   Sande Rives, PA-C or Almyra Deforest, PA-C

## 2021-01-18 ENCOUNTER — Other Ambulatory Visit: Payer: Self-pay | Admitting: Cardiovascular Disease

## 2021-01-18 DIAGNOSIS — I4821 Permanent atrial fibrillation: Secondary | ICD-10-CM

## 2021-02-07 ENCOUNTER — Telehealth: Payer: Self-pay

## 2021-02-07 DIAGNOSIS — Z01810 Encounter for preprocedural cardiovascular examination: Secondary | ICD-10-CM

## 2021-02-07 DIAGNOSIS — I4821 Permanent atrial fibrillation: Secondary | ICD-10-CM

## 2021-02-07 NOTE — Telephone Encounter (Signed)
° °  Pre-operative Risk Assessment    Patient Name: Steven Blair  DOB: 08-Aug-1945 MRN: [id number]      Request for Surgical Clearance    Procedure:   COLONOSCOPY  Date of Surgery:  Clearance 05/09/21               DR  Surgeon:  DR Alessandra Bevels Surgeon's Group or Practice Name:  [facility] Phone number:  [phone] Fax number:  [phone]   Type of Clearance Requested:   - Pharmacy:  Hold Apixaban (Eliquis) 2 DAYS   Type of Anesthesia:   PROPOFOL   Additional requests/questions:

## 2021-02-08 NOTE — Telephone Encounter (Signed)
Pt called back and is agreeable to plan of care to get a new set of lab work BMET, CBC especially being on a blood thinner. Pt states he will come in 02/26/21 for lab work to be done at the [facility]. Location. Pt thanked me for the call and the help. I will update the pre op provider as well as the pre op Pharm-D. Will send FYI to requesting office the pt will need labs before he can be cleared. Lab orders placed an appt made.

## 2021-02-08 NOTE — Telephone Encounter (Signed)
Clinical pharmacist to review Eliquis

## 2021-02-08 NOTE — Telephone Encounter (Addendum)
Patient with diagnosis of Afib on Eliquis for anticoagulation.    Procedure: Colonoscopy Date of procedure: 05/09/21  CHA2DS2-VASc Score = 4  This indicates a 4.8% annual risk of stroke. The patient's score is based upon: CHF History: 0 HTN History: 1 Diabetes History: 0 Stroke History: 0 Vascular Disease History: 1 Age Score: 2 Gender Score: 0  Per office protocol, patient can hold Eliquis for 2 days prior to procedure.    However, pt has not had BMET and CBC in >1 year which will need to be re-checked prior to procedure for annual DOAC monitoring.

## 2021-02-08 NOTE — Telephone Encounter (Signed)
Left message for the pt to call the office to schedule lab work for pre op clearance.

## 2021-02-08 NOTE — Telephone Encounter (Signed)
Please inform the patient the need for blood work, CBC and BMET, unless he had one within past year.

## 2021-02-26 ENCOUNTER — Other Ambulatory Visit: Payer: No Typology Code available for payment source

## 2021-02-26 ENCOUNTER — Other Ambulatory Visit: Payer: Self-pay

## 2021-02-26 DIAGNOSIS — I4821 Permanent atrial fibrillation: Secondary | ICD-10-CM

## 2021-02-26 DIAGNOSIS — Z01810 Encounter for preprocedural cardiovascular examination: Secondary | ICD-10-CM

## 2021-02-26 LAB — BASIC METABOLIC PANEL
BUN/Creatinine Ratio: 19 (ref 10–24)
BUN: 15 mg/dL (ref 8–27)
CO2: 24 mmol/L (ref 20–29)
Calcium: 9 mg/dL (ref 8.6–10.2)
Chloride: 103 mmol/L (ref 96–106)
Creatinine, Ser: 0.78 mg/dL (ref 0.76–1.27)
Glucose: 89 mg/dL (ref 70–99)
Potassium: 4.9 mmol/L (ref 3.5–5.2)
Sodium: 140 mmol/L (ref 134–144)
eGFR: 93 mL/min/{1.73_m2} (ref >59)

## 2021-02-26 LAB — CBC
Hematocrit: 45.2 % (ref 37.5–51.0)
Hemoglobin: 15.2 g/dL (ref 13.0–17.7)
MCH: 33.7 pg — ABNORMAL HIGH (ref 26.6–33.0)
MCHC: 33.6 g/dL (ref 31.5–35.7)
MCV: 100 fL — ABNORMAL HIGH (ref 79–97)
Platelets: 165 10*3/uL (ref 150–450)
RBC: 4.51 x10E6/uL (ref 4.14–5.80)
RDW: 11.3 % — ABNORMAL LOW (ref 11.6–15.4)
WBC: 6.2 10*3/uL (ref 3.4–10.8)

## 2021-02-26 NOTE — Telephone Encounter (Signed)
Labs have resulted and are stable, plt 165K, SCr 0.78, CrCl > 178m/min.  Pt cleared to hold Eliquis for 2 days prior to procedure.

## 2021-02-27 NOTE — Telephone Encounter (Addendum)
° °  Name: Steven Blair  DOB: [birth year]  MRN: [id number]   Primary Cardiologist: Dr. Eleonore Chiquito  Chart reviewed as part of pre-operative protocol coverage. Patient was contacted 02/27/2021 in reference to pre-operative risk assessment for pending surgery as outlined below.  Steven Blair was last seen 11/2020 by Dr. Audie Box. Got VM. LMTCB.  Addendum: Pt returned call and affirms he has been doing well without any new cardiac symptoms. Therefore, based on ACC/AHA guidelines, the patient would be at acceptable risk for the planned procedure without further cardiovascular testing. The patient was advised that if he develops new symptoms prior to surgery to contact our office to arrange for a follow-up visit, and he verbalized understanding.  Per pharmD recommendation, "Pt cleared to hold Eliquis for 2 days prior to procedure."  Will route this bundled recommendation to requesting provider via Epic fax function. Please call with questions.  Charlie Pitter, PA-C 02/27/2021, 8:01 AM

## 2021-04-18 ENCOUNTER — Other Ambulatory Visit (HOSPITAL_COMMUNITY): Payer: Self-pay | Admitting: Physician Assistant

## 2021-04-18 NOTE — Telephone Encounter (Signed)
Prescription refill request for Eliquis received. ?Indication:Afib ?Last office visit:11/22 ?Scr:0.5 ?Age: 76 ?Weight:117.9 kg ? ?Prescription refilled ? ?

## 2021-10-15 ENCOUNTER — Other Ambulatory Visit (HOSPITAL_COMMUNITY): Payer: Self-pay | Admitting: Physician Assistant

## 2021-10-15 NOTE — Telephone Encounter (Signed)
Prescription refill request for Eliquis received. Indication: afib  Last office visit: One[place], 12/20/2020 Scr: 0.78, 02/26/2021 Age: 76 yo  Weight: 117.9 kg   Refill sent.

## 2022-01-01 ENCOUNTER — Other Ambulatory Visit: Payer: Self-pay | Admitting: Internal Medicine

## 2022-01-01 DIAGNOSIS — E785 Hyperlipidemia, unspecified: Secondary | ICD-10-CM

## 2022-01-13 ENCOUNTER — Other Ambulatory Visit: Payer: Self-pay | Admitting: Cardiovascular Disease

## 2022-01-13 DIAGNOSIS — I4821 Permanent atrial fibrillation: Secondary | ICD-10-CM

## 2022-02-06 ENCOUNTER — Other Ambulatory Visit: Payer: No Typology Code available for payment source

## 2022-02-22 ENCOUNTER — Ambulatory Visit: Payer: No Typology Code available for payment source | Admitting: Cardiovascular Disease

## 2022-03-01 ENCOUNTER — Other Ambulatory Visit: Payer: No Typology Code available for payment source

## 2022-03-21 ENCOUNTER — Other Ambulatory Visit: Payer: No Typology Code available for payment source

## 2022-05-10 ENCOUNTER — Ambulatory Visit: Payer: No Typology Code available for payment source | Admitting: Cardiovascular Disease

## 2022-06-03 ENCOUNTER — Ambulatory Visit: Payer: No Typology Code available for payment source | Admitting: General Practice

## 2022-06-11 NOTE — Progress Notes (Unsigned)
Cardiology Clinic Note   Patient Name: Steven Blair Date of Encounter: 06/12/2022  Primary Care Provider:  Marden Noble, MD Primary Cardiologist:  Reatha Harps, MD  Patient Profile    Steven Blair 77 year old male presents to the clinic today for follow-up evaluation of his atrial fibrillation and hypertension.  Past Medical History    Past Medical History:  Diagnosis Date   Arrhythmia    Arthritis    hands shoulders, hips   BMI 40.0-44.9, adult (HCC)    Greater than 40 BMI   DVT of leg (deep venous thrombosis) (HCC)    left leg tx. Xarelto - no longer taking   Dysrhythmia    Atrial Fib x1 episode, cardioversion 4'16- NSR   Fracture of trochanter of right femur (HCC) 02/2019   History of kidney stones    x1 -20 yrs ago   Hypertension    Peripheral vascular disease (HCC)    DVT 2014   Ulcerative colitis (HCC)    history of , some current issues at this time   Past Surgical History:  Procedure Laterality Date   CARDIOVERSION     tx Paroxysmal Atrial Fibrillation- converted to NSR   CARDIOVERSION N/A 11/06/2018   Procedure: CARDIOVERSION;  Surgeon: Little Ishikawa, MD;  Location: [facility] ENDOSCOPY;  Service: Endoscopy;  Laterality: N/A;   COLONOSCOPY W/ POLYPECTOMY     EYE SURGERY Right 2016   FEMUR SURGERY Left    ORIF 10'05   FLEXIBLE SIGMOIDOSCOPY N/A 11/01/2014   Procedure: FLEXIBLE SIGMOIDOSCOPY;  Surgeon: Charolett Bumpers, MD;  Location: [facility] ENDOSCOPY;  Service: Endoscopy;  Laterality: N/A;   JOINT REPLACEMENT     BTHA   TOTAL HIP REVISION Right 08/19/2019   Procedure: TOTAL HIP REVISION;  Surgeon: Durene Romans, MD;  Location: [facility] ORS;  Service: Orthopedics;  Laterality: Right;  2 hrs    Allergies  Allergies  Allergen Reactions   Sulfa Antibiotics Other (See Comments)    Childhood allergy    History of Present Illness    [name] has a PMH of permanent atrial fibrillation, ulcerative colitis, hyperlipidemia, hypertension, and  OSA.  He underwent DCCV 11/06/2018 and returned to atrial fibrillation.  He was evaluated in the A-fib clinic 12/20 and decided to opt for rate control.  CHA2DS2-VASc score 3.  He was seen in follow-up by Dr. Flora Lipps on 12/20/2020.  During that time he was doing well.  He denied chest pain and shortness of breath.  His blood pressure was well-controlled at 122/70.  His EKG showed atrial fibrillation.  He was walking 5 to 6 days/week for up to 60 minutes.  He denied exertional chest pain.  His ulcerative colitis was well-controlled.  He denied bleeding issues on Eliquis.  He presents to the clinic today for follow-up evaluation and states he continues to be very physically active at the gym.  He is working out at [employer] 6-7 days/week.  He enjoys swimming and walking.  He is working on losing weight and has gradually lost about 14 pounds.  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  He does note occasional episodes of lightheadedness when sitting up quickly.  We reviewed his previous cardiology visit and his EKG.  His blood pressure is well-controlled.  He is tolerating his medications well.  We will plan follow-up in 12 months.  Today he denies chest pain, shortness of breath, lower extremity edema, fatigue, palpitations,  melena, hematuria, hemoptysis, diaphoresis, weakness, presyncope, syncope, orthopnea, and PND.   Home Medications    Prior to Admission medications   Medication Sig Start Date End Date Taking? Authorizing Provider  Cholecalciferol (VITAMIN D3) 50 MCG (2000 UT) capsule Take 2,000 Units by mouth daily.    [provider]  diltiazem (CARDIZEM CD) 120 MG 24 hr capsule TAKE 1 CAPSULE(120 MG) BY MOUTH DAILY 01/15/22   O'Neal, Ronnald Ramp, MD  ELIQUIS 5 MG TABS tablet TAKE 1 TABLET(5 MG) BY MOUTH TWICE DAILY 10/15/21   O'Neal, Ronnald Ramp, MD  mesalamine (LIALDA) 1.2 g EC tablet Take 2.4 g by mouth daily. 02/01/19   [provider]    Family History    Family History  Problem Relation Age of Onset   Lung cancer Brother    He indicated that the status of his brother is unknown.  Social History    Social History   Socioeconomic History   Marital status: Married    Spouse name: Not on file   Number of children: Not on file   Years of education: Not on file   Highest education level: Not on file  Occupational History   Not on file  Tobacco Use   Smoking status: Never   Smokeless tobacco: Never  Vaping Use   Vaping Use: Never used  Substance and Sexual Activity   Alcohol use: Yes    Alcohol/week: 3.0 standard drinks of alcohol    Types: 1 Glasses of wine, 2 Cans of beer per week    Comment: weekend   Drug use: No   Sexual activity: Not on file  Other Topics Concern   Not on file  Social History Narrative   Not on file   Social Determinants of Health   Financial Resource Strain: Not on file  Food Insecurity: Not on file  Transportation Needs: Not on file  Physical Activity: Not on file  Stress: Not on file  Social Connections: Not on file  Intimate Partner Violence: Not on file     Review of Systems    General:  No chills, fever, night sweats or weight changes.  Cardiovascular:  No chest pain, dyspnea on exertion, edema, orthopnea, palpitations, paroxysmal nocturnal dyspnea. Dermatological: No rash, lesions/masses Respiratory: No cough, dyspnea Urologic: No hematuria, dysuria Abdominal:   No nausea, vomiting, diarrhea, bright red blood per rectum, melena, or hematemesis Neurologic:  No visual changes, wkns, changes in mental status. All other systems reviewed and are otherwise negative except as noted above.  Physical Exam    VS:  BP 136/80 (BP Location: Left Arm, Patient Position: Sitting, Cuff Size: Large)   Pulse 92   Ht 6' (1.829 m)   Wt 262 lb 6.4 oz (119 kg)   SpO2 96%   BMI 35.59 kg/m  , BMI Body mass index is 35.59 kg/m. GEN: Well nourished, well  developed, in no acute distress. HEENT: normal. Neck: Supple, no JVD, carotid bruits, or masses. Cardiac: RRR, no murmurs, rubs, or gallops. No clubbing, cyanosis, edema.  Radials/DP/PT 2+ and equal bilaterally.  Respiratory:  Respirations regular and unlabored, clear to auscultation bilaterally. GI: Soft, nontender, nondistended, BS + x 4. MS: no deformity or atrophy. Skin: warm and dry, no rash. Neuro:  Strength and sensation are intact. Psych: Normal affect.  Accessory Clinical Findings    Recent Labs: No results found for requested labs within last 365 days.   Recent Lipid Panel No results found for: "CHOL", "TRIG", "HDL", "CHOLHDL", "VLDL", "LDLCALC", "  LDLDIRECT"       ECG personally reviewed by me today-atrial fibrillation left axis deviation 92 bpm- No acute changes  Echocardiogram 12/03/2018    IMPRESSIONS     1. Left ventricular ejection fraction, by visual estimation, is 45 to  50%. The left ventricle has mildly decreased function. There is mildly  increased left ventricular hypertrophy.   2. Left ventricular diastolic parameters are indeterminate.   3. Global right ventricle has normal systolic function.The right  ventricular size is mildly enlarged. No increase in right ventricular wall  thickness.   4. Left atrial size was severely dilated.   5. Right atrial size was mildly dilated.   6. The mitral valve is normal in structure. No evidence of mitral valve  regurgitation.   7. The tricuspid valve is normal in structure. Tricuspid valve  regurgitation is not demonstrated.   8. The aortic valve is tricuspid. Aortic valve regurgitation is not  visualized. No evidence of aortic valve sclerosis or stenosis.   9. The pulmonic valve was not well visualized. Pulmonic valve  regurgitation is not visualized.  10. There is mild dilatation of the aortic root measuring 40 mm.  11. The inferior vena cava is normal in size with greater than 50%  respiratory variability,  suggesting right atrial pressure of 3 mmHg.   FINDINGS   Left Ventricle: Left ventricular ejection fraction, by visual estimation,  is 45 to 50%. The left ventricle has mildly decreased function. There is  mildly increased left ventricular hypertrophy. Concentric left ventricular  hypertrophy. Left ventricular  diastolic parameters are indeterminate.   Right Ventricle: The right ventricular size is mildly enlarged. No  increase in right ventricular wall thickness. Global RV systolic function  is has normal systolic function. The tricuspid regurgitant velocity is  1.98 m/s, and with an assumed right atrial   pressure of 3 mmHg, the estimated right ventricular systolic pressure is  normal at 18.7 mmHg.   Left Atrium: Left atrial size was severely dilated.   Right Atrium: Right atrial size was mildly dilated   Pericardium: There is no evidence of pericardial effusion.   Mitral Valve: The mitral valve is normal in structure. No evidence of  mitral valve regurgitation.   Tricuspid Valve: The tricuspid valve is normal in structure. Tricuspid  valve regurgitation is not demonstrated.   Aortic Valve: The aortic valve is tricuspid. Aortic valve regurgitation is  not visualized. The aortic valve is structurally normal, with no evidence  of sclerosis or stenosis.   Pulmonic Valve: The pulmonic valve was not well visualized. Pulmonic valve  regurgitation is not visualized.   Aorta: Aortic dilatation noted. There is mild dilatation of the aortic  root measuring 40 mm.   Venous: The inferior vena cava is normal in size with greater than 50%  respiratory variability, suggesting right atrial pressure of 3 mmHg.   IAS/Shunts: The interatrial septum was not well visualized.   Assessment & Plan   1.  Permanent atrial fibrillation-EKG today shows atrial fibrillation 92 bpm.  Compliant with apixaban.  Denies bleeding issues. Continue diltiazem, apixaban Avoid triggers caffeine, chocolate,  EtOH, dehydration etc.  Essential hypertension-BP today 136/80. Low-sodium diet Continue diltiazem Maintain blood pressure log  Hyperlipidemia-LDL 134 12/03/21.  Very physically active doing aerobic and gym activities 6 to 7 days/week.  Has lost 14 pounds High-fiber diet Increase physical activity as tolerated Follows with PCP  Disposition: Follow-up with Dr. Flora Lipps or me in 12 months.   Thomasene Ripple. Laveyah Oriol NP-C  06/12/2022, 10:48 AM [facility] [address] Office [phone] Fax [phone]    I spent 13 minutes examining this patient, reviewing medications, and using patient centered shared decision making involving her cardiac care.  Prior to her visit I spent greater than 20 minutes reviewing her past medical history,  medications, and prior cardiac tests.

## 2022-06-12 ENCOUNTER — Encounter: Payer: Self-pay | Admitting: General Practice

## 2022-06-12 ENCOUNTER — Ambulatory Visit: Payer: No Typology Code available for payment source | Attending: Cardiovascular Disease | Admitting: General Practice

## 2022-06-12 VITALS — BP 136/80 | HR 92 | Ht 72.0 in | Wt 262.4 lb

## 2022-06-12 DIAGNOSIS — E782 Mixed hyperlipidemia: Secondary | ICD-10-CM | POA: Diagnosis not present

## 2022-06-12 DIAGNOSIS — I10 Essential (primary) hypertension: Secondary | ICD-10-CM | POA: Diagnosis not present

## 2022-06-12 DIAGNOSIS — I4821 Permanent atrial fibrillation: Secondary | ICD-10-CM

## 2022-06-12 NOTE — Patient Instructions (Signed)
Medication Instructions:  Your physician recommends that you continue on your current medications as directed. Please refer to the Current Medication list given to you today.  *If you need a refill on your cardiac medications before your next appointment, please call your pharmacy*   Lab Work: NONE ordered at this time of appointment   If you have labs (blood work) drawn today and your tests are completely normal, you will receive your results only by: MyChart Message (if you have MyChart) OR A paper copy in the mail If you have any lab test that is abnormal or we need to change your treatment, we will call you to review the results.   Testing/Procedures: NONE ordered at this time of appointment     Follow-Up: At [facility], you and your health needs are our priority.  As part of our continuing mission to provide you with exceptional heart care, we have created designated Provider Care Teams.  These Care Teams include your primary Cardiologist (physician) and Advanced Practice Providers (APPs -  Physician Assistants and Nurse Practitioners) who all work together to provide you with the care you need, when you need it.  We recommend signing up for the patient portal called "MyChart".  Sign up information is provided on this After Visit Summary.  MyChart is used to connect with patients for Virtual Visits (Telemedicine).  Patients are able to view lab/test results, encounter notes, upcoming appointments, etc.  Non-urgent messages can be sent to your provider as well.   To learn more about what you can do with MyChart, go to [email or web address].    Your next appointment:   1 year(s)  Provider:   Kimballton T O'Neal, MD     Other Instructions   

## 2022-06-12 NOTE — Addendum Note (Signed)
Addended by: Berlinda Last on: 06/12/2022 11:39 AM   Modules accepted: Orders

## 2022-06-12 NOTE — Addendum Note (Signed)
Addended by: Berlinda Last on: 06/12/2022 11:35 AM   Modules accepted: Orders

## 2022-07-11 ENCOUNTER — Telehealth: Payer: Self-pay | Admitting: Cardiovascular Disease

## 2022-07-11 MED ORDER — APIXABAN 5 MG PO TABS: ORAL_TABLET | ORAL | 0 refills | Status: DC

## 2022-07-11 NOTE — Telephone Encounter (Signed)
Pt c/o medication issue:  1. Name of Medication: ELIQUIS 5 MG TABS tablet   2. How are you currently taking this medication (dosage and times per day)? Is currently out of medication   3. Are you having a reaction (difficulty breathing--STAT)? No   4. What is your medication issue? Pharmacy stated it would cost him $227 for a 30 day supply. It normally only cost him $30 for a 90 day supply.

## 2022-07-11 NOTE — Telephone Encounter (Signed)
Patient in [place].  Advised to apply for assistance.  He also is out of medication.  Samples given and assistance form printed for pickup. Set at front desk for pick up

## 2022-07-12 ENCOUNTER — Telehealth: Payer: Self-pay | Admitting: Cardiovascular Disease

## 2022-07-12 NOTE — Telephone Encounter (Signed)
Patient return call . RN informed patient to please bring the "out of pocket  expense" to the office. All parts of form will need to be sent at one time.  Patient voiced understanding and states he will bring next week

## 2022-07-12 NOTE — Telephone Encounter (Signed)
Routed to Sharon RN 

## 2022-07-12 NOTE — Telephone Encounter (Signed)
Left message to call back  Patient needs to bring out of pocket expense for patient assistance before information is faxed .

## 2022-07-12 NOTE — Telephone Encounter (Signed)
Paper Work Dropped ZOX:[id number] Financial assistance  Date:6.21.24  Location of paper:  MD box

## 2022-07-15 ENCOUNTER — Telehealth: Payer: Self-pay | Admitting: Cardiovascular Disease

## 2022-07-15 NOTE — Telephone Encounter (Signed)
Spoke to the pt, he dropped off his documentation to apply for patient assistance for Eliquis. Will forward to nurse.

## 2022-07-15 NOTE — Telephone Encounter (Signed)
Patient called to get status on paperwork that was dropped off this morning

## 2022-07-15 NOTE — Telephone Encounter (Signed)
Paper Work Dropped Off: Prescription list for application   Date: 06.24.24@ 10:00 am  Location of paper:  Dr [employer]

## 2022-07-24 NOTE — Telephone Encounter (Signed)
Faxed patient assistance - Eliquis

## 2022-07-24 NOTE — Telephone Encounter (Signed)
Patient assistance  sent to [facility] squibb-for Eliqius 5 mg twice a day

## 2022-11-12 ENCOUNTER — Other Ambulatory Visit (HOSPITAL_BASED_OUTPATIENT_CLINIC_OR_DEPARTMENT_OTHER): Payer: Self-pay

## 2022-11-12 MED ORDER — FLUAD 0.5 ML IM SUSY
0.5000 mL | PREFILLED_SYRINGE | Freq: Once | INTRAMUSCULAR | 0 refills | Status: AC
  Filled 2022-11-12: qty 0.5, 1d supply, fill #0

## 2023-01-14 ENCOUNTER — Other Ambulatory Visit: Payer: Self-pay | Admitting: Cardiovascular Disease

## 2023-01-14 DIAGNOSIS — I4821 Permanent atrial fibrillation: Secondary | ICD-10-CM

## 2023-07-20 ENCOUNTER — Other Ambulatory Visit: Payer: Self-pay | Admitting: Cardiovascular Disease

## 2023-07-20 DIAGNOSIS — I4821 Permanent atrial fibrillation: Secondary | ICD-10-CM

## 2023-07-22 ENCOUNTER — Other Ambulatory Visit: Payer: Self-pay

## 2023-08-19 ENCOUNTER — Other Ambulatory Visit: Payer: Self-pay | Admitting: Cardiovascular Disease

## 2023-08-19 DIAGNOSIS — I4821 Permanent atrial fibrillation: Secondary | ICD-10-CM

## 2023-08-20 ENCOUNTER — Telehealth: Payer: Self-pay

## 2023-08-20 ENCOUNTER — Ambulatory Visit: Attending: Cardiology | Admitting: Cardiology

## 2023-08-20 ENCOUNTER — Encounter: Payer: Self-pay | Admitting: Cardiology

## 2023-08-20 ENCOUNTER — Other Ambulatory Visit (HOSPITAL_COMMUNITY): Payer: Self-pay

## 2023-08-20 VITALS — BP 124/78 | HR 92 | Ht 72.0 in | Wt 253.6 lb

## 2023-08-20 DIAGNOSIS — I4821 Permanent atrial fibrillation: Secondary | ICD-10-CM

## 2023-08-20 DIAGNOSIS — I10 Essential (primary) hypertension: Secondary | ICD-10-CM

## 2023-08-20 DIAGNOSIS — E782 Mixed hyperlipidemia: Secondary | ICD-10-CM | POA: Diagnosis not present

## 2023-08-20 NOTE — Progress Notes (Signed)
 Cardiology Office Note:   Date:  08/20/2023  ID:  Steven Blair, DOB 1945-05-30, MRN [id number] PCP: Steven Charleston, MD (Inactive)  [facility] Providers Cardiologist:  Steven ONEIDA Decent, MD    History of Present Illness:   Discussed the use of AI scribe software for clinical note transcription with the patient, who gave verbal consent to proceed.  History of Present Illness [name] is a 78 year old male with atrial fibrillation who presents for a one-year follow-up.  He feels very well, better than when he was 69 or even 78 years old. He exercises five to six days a week, swimming for 60 to 70 minutes, walking, and using exercise machines. He also takes balance classes. No dizziness, shortness of breath, chest discomfort, or palpitations. He mentions a balance issue related to a past hip replacement.  He experiences chronic swelling in his lower legs, and feeling 'a little tingly like it's swollen.' The swelling is present daily. No pain when walking and no symptoms of orthopnea. He sometimes experiences a burning sensation in his toes at night. Swelling improves with leg elevation and worsens with prolonged standing  He has a history of atrial fibrillation and was previously on Eliquis , which he stopped six months ago due to cost. His blood pressure at home is typically around 124/78, with a heart rate in the 80s to 90s. He was on a higher dose of diltiazem  in the past, which was reduced due to weight loss and stable heart rate. He currently takes diltiazem  120mg  once a day.  He has a history of ulcerative colitis but reports no current symptoms. He is on mesalamine  for this condition. He recently had a blood test showing borderline low platelets, with plans to follow up with another test next week. He has had low platelets in the past.  He is now on Crestor for cholesterol management, which was started after a check-up last November with PCP. He reports no issues with this  medication.   Today patient denies chest pain, shortness of breath, fatigue, palpitations, melena, hematuria, hemoptysis, diaphoresis, weakness, presyncope, syncope, orthopnea, and PND.   Studies Reviewed:    EKG:   EKG Interpretation Date/Time:  Wednesday August 20 2023 13:35:03 EDT Ventricular Rate:  101 PR Interval:    QRS Duration:  96 QT Interval:  380 QTC Calculation: 492 R Axis:   -51  Text Interpretation: Atrial fibrillation with rapid ventricular response Left axis deviation Confirmed by Steven Blair [id number]) on 08/20/2023 1:41:26 PM      Risk Assessment/Calculations:    CHA2DS2-VASc Score = 4   This indicates a 4.8% annual risk of stroke. The patient's score is based upon: CHF History: 0 HTN History: 1 Diabetes History: 0 Stroke History: 0 Vascular Disease History: 1 Age Score: 2 Gender Score: 0             Physical Exam:   VS:  BP 124/78   Pulse 92   Ht 6' (1.829 m)   Wt 253 lb 9.6 oz (115 kg)   SpO2 97%   BMI 34.39 kg/m    Wt Readings from Last 3 Encounters:  08/20/23 253 lb 9.6 oz (115 kg)  06/12/22 262 lb 6.4 oz (119 kg)  12/20/20 260 lb (117.9 kg)     Physical Exam Vitals reviewed.  Constitutional:      Appearance: Normal appearance.  HENT:     Head: Normocephalic.  Eyes:     Pupils: Pupils are equal, round, and  reactive to light.  Cardiovascular:     Rate and Rhythm: Normal rate. Rhythm irregular.     Pulses: Normal pulses.     Heart sounds: Normal heart sounds.  Pulmonary:     Effort: Pulmonary effort is normal.     Breath sounds: Normal breath sounds.  Abdominal:     General: Abdomen is flat.     Palpations: Abdomen is soft.  Musculoskeletal:     Right lower leg: Edema (trace) present.     Left lower leg: Edema (trace) present.     Comments: LE with visible mild venous engorgement.   Skin:    General: Skin is warm and dry.     Capillary Refill: Capillary refill takes less than 2 seconds.  Neurological:     General: No  focal deficit present.     Mental Status: He is alert and oriented to person, place, and time.  Psychiatric:        Mood and Affect: Mood normal.        Behavior: Behavior normal.        Thought Content: Thought content normal.        Judgment: Judgment normal.      ASSESSMENT AND PLAN:    Assessment & Plan Atrial fibrillation with anticoagulation management Atrial fibrillation is well-managed with no symptoms such as palpitations or tachycardia. He discontinued Eliquis  six months ago due to cost, increasing stroke risk. Blood pressure is controlled at 124/78 mmHg. Heart rate ranges from 80s to 90s, with a recent EKG showing 101 bpm and manual check at 92 bpm. He remains physically active, swimming regularly without limitations. Discussed the importance of anticoagulation to prevent stroke, considering cost-effective Pradaxa as patient not able to qualify for patient assistance. - Will plan to initiate Pradaxa as an alternative anticoagulant due to cost issues with Eliquis . Needs BMET to determine dosing (likely 150mg  BID).  - Continue Diltiazem  SR24 hr 120mg . - Schedule follow-up with Steven Blair in six months to monitor response to Pradaxa.  Hypertension BP well controlled. - Continue Diltiazem   Chronic venous insufficiency of lower extremities Leg swelling is consistent with chronic venous insufficiency. Swelling improves with leg elevation and worsens with prolonged standing.  - Recommend wearing knee high compression socks to improve venous return, especially on days with increased activity or swelling.  Hyperlipidemia Last visible lipid panel shows total cholesterol 222mg /dL, LDL 134mg /dL. Patient now on crestor 10mg  per PCP. - Managed by PCP.  6 month follow up with primary cardiologist.        Signed, Steven Pouch, PA-C

## 2023-08-20 NOTE — Telephone Encounter (Signed)
 Pharmacy Patient Advocate Encounter  Insurance verification completed.   The patient is insured through [facility]   Ran test claim for DABIGATRAN 150 MG CAP. Currently a quantity of 60 is a 30 day supply and the co-pay is $10 . The current 30 day co-pay is, $10.  No PA needed at this time.  This test claim was processed through [facility]- copay amounts may vary at other pharmacies due to pharmacy/plan contracts, or as the patient moves through the different stages of their insurance plan.    PLAN IS COVERING GENERIC WITHOUT A PA, ONLY BRAND PRADAXA NEEDS A PA.

## 2023-08-20 NOTE — Patient Instructions (Signed)
 Lab Work: Nutritional therapist  If you have labs (blood work) drawn today and your tests are completely normal, you will receive your results only by: [employer] (if you have MyChart) OR A paper copy in the mail If you have any lab test that is abnormal or we need to change your treatment, we will call you to review the results.   Follow-Up: At [facility], you and your health needs are our priority.  As part of our continuing mission to provide you with exceptional heart care, our providers are all part of one team.  This team includes your primary Cardiologist (physician) and Advanced Practice Providers or APPs (Physician Assistants and Nurse Practitioners) who all work together to provide you with the care you need, when you need it.  Your next appointment:   6 month(s)  Provider: Dr. Barbaraann or One of our Advanced Practice Providers (APPs): Morse Clause, PA-C  Lamarr Satterfield, NP Miriam Shams, NP  Olivia Pavy, PA-C Josefa Beauvais, NP  Leontine Salen, PA-C Orren Fabry, PA-C  [place], PA-C Ernest Dick, NP  Damien Braver, NP Jon Hails, PA-C  Waddell Donath, PA-C    Dayna Dunn, PA-C  Glendia Ferrier, PA-C Lum Louis, NP Katlyn West, NP Callie Goodrich, PA-C  Evan Williams, PA-C Sheng Haley, PA-C  Xika Zhao, NP Kathleen Johnson, PA-C

## 2023-08-20 NOTE — Telephone Encounter (Signed)
 Pharmacy Patient Advocate Encounter  Insurance verification completed.   The patient is insured through [employer]   Ran test claim for XARELTO. Currently a quantity of 30 is a 30 day supply and the co-pay is $227.68   This test claim was processed through [facility]- copay amounts may vary at other pharmacies due to pharmacy/plan contracts, or as the patient moves through the different stages of their insurance plan.   SAME TIER ON PLAN AS ELIQUIS 

## 2023-08-20 NOTE — Telephone Encounter (Signed)
 Pharmacy Patient Advocate Encounter  Insurance verification completed.   The patient is insured through [facility]   Ran test claim for ELIQUIS . Currently a quantity of 60 is a 30 day supply and the co-pay is $230.82 . The current 30 day co-pay is, $230.82.  No PA needed at this time.  This test claim was processed through [facility]- copay amounts may vary at other pharmacies due to pharmacy/plan contracts, or as the patient moves through the different stages of their insurance plan.     COMMERCIALLY INSURED, NOT ELIGIBLE FOR PAP. PT SHOULD UTILIZE COPAY CARD.

## 2023-08-21 ENCOUNTER — Other Ambulatory Visit: Payer: Self-pay

## 2023-08-21 ENCOUNTER — Ambulatory Visit: Payer: Self-pay | Admitting: Cardiology

## 2023-08-21 DIAGNOSIS — I4821 Permanent atrial fibrillation: Secondary | ICD-10-CM

## 2023-08-21 LAB — BASIC METABOLIC PANEL WITH GFR
BUN/Creatinine Ratio: 22 (ref 10–24)
BUN: 18 mg/dL (ref 8–27)
CO2: 23 mmol/L (ref 20–29)
Calcium: 9.1 mg/dL (ref 8.6–10.2)
Chloride: 103 mmol/L (ref 96–106)
Creatinine, Ser: 0.83 mg/dL (ref 0.76–1.27)
Glucose: 90 mg/dL (ref 70–99)
Potassium: 4.3 mmol/L (ref 3.5–5.2)
Sodium: 141 mmol/L (ref 134–144)
eGFR: 90 mL/min/1.73 (ref >59)

## 2023-08-21 MED ORDER — DILTIAZEM HCL ER COATED BEADS 120 MG PO CP24: 120.0000 mg | ORAL_CAPSULE | Freq: Every day | ORAL | 3 refills | Status: AC

## 2023-08-22 ENCOUNTER — Other Ambulatory Visit (HOSPITAL_COMMUNITY): Payer: Self-pay

## 2023-08-23 ENCOUNTER — Other Ambulatory Visit (HOSPITAL_COMMUNITY): Payer: Self-pay

## 2023-08-25 NOTE — Telephone Encounter (Signed)
 Call placed to pt regarding Pradaxa .  Left a message for pt to call back.

## 2023-08-29 ENCOUNTER — Other Ambulatory Visit (HOSPITAL_BASED_OUTPATIENT_CLINIC_OR_DEPARTMENT_OTHER): Payer: Self-pay

## 2023-08-29 MED ORDER — DABIGATRAN ETEXILATE MESYLATE 150 MG PO CAPS
150.0000 mg | ORAL_CAPSULE | Freq: Two times a day (BID) | ORAL | 11 refills | Status: AC
  Filled 2023-08-29: qty 60, 30d supply, fill #0
  Filled 2023-09-29: qty 60, 30d supply, fill #1
  Filled 2023-10-24: qty 60, 30d supply, fill #2
  Filled 2023-11-24: qty 60, 30d supply, fill #3
  Filled 2023-12-23: qty 60, 30d supply, fill #4
  Filled 2024-01-21 – 2024-02-17 (×3): qty 60, 30d supply, fill #5

## 2023-08-30 ENCOUNTER — Other Ambulatory Visit (HOSPITAL_BASED_OUTPATIENT_CLINIC_OR_DEPARTMENT_OTHER): Payer: Self-pay

## 2023-10-01 ENCOUNTER — Other Ambulatory Visit (HOSPITAL_BASED_OUTPATIENT_CLINIC_OR_DEPARTMENT_OTHER): Payer: Self-pay

## 2023-10-24 ENCOUNTER — Other Ambulatory Visit (HOSPITAL_BASED_OUTPATIENT_CLINIC_OR_DEPARTMENT_OTHER): Payer: Self-pay

## 2023-11-15 ENCOUNTER — Ambulatory Visit (HOSPITAL_COMMUNITY)
Admission: EM | Admit: 2023-11-15 | Discharge: 2023-11-15 | Disposition: A | Discharge status: Home / Self Care | Attending: Emergency Medicine | Admitting: Emergency Medicine

## 2023-11-15 ENCOUNTER — Encounter (HOSPITAL_COMMUNITY): Payer: Self-pay | Admitting: Emergency Medicine

## 2023-11-15 ENCOUNTER — Other Ambulatory Visit (HOSPITAL_BASED_OUTPATIENT_CLINIC_OR_DEPARTMENT_OTHER): Payer: Self-pay

## 2023-11-15 DIAGNOSIS — S51812A Laceration without foreign body of left forearm, initial encounter: Secondary | ICD-10-CM | POA: Diagnosis not present

## 2023-11-15 DIAGNOSIS — S4992XA Unspecified injury of left shoulder and upper arm, initial encounter: Secondary | ICD-10-CM | POA: Diagnosis not present

## 2023-11-15 MED ORDER — CEPHALEXIN 500 MG PO CAPS
500.0000 mg | ORAL_CAPSULE | Freq: Four times a day (QID) | ORAL | 0 refills | Status: DC
  Filled 2023-11-15: qty 20, 5d supply, fill #0

## 2023-11-15 NOTE — Discharge Instructions (Addendum)
 Wash dry area well apply Neosporin daily Keep covered with a nonstick Tegaderm and Coban avoid bandages as this is going to reopen the wound We will cover with a few days of antibiotics due to risk of infection Keep covered 5 to 7 days Follow-up with your primary care

## 2023-11-15 NOTE — ED Triage Notes (Signed)
 Pt reports around 4am when going to restroom this morning stumped his toe and hit left forearm on door framing tearing the skin. Pt has bandaid on area controlling bleeding. Ot is on blood thinners. Reports that he swims a lot for exercise and was wanting area looked at to make sure safe to cover while continues to swim.

## 2023-11-15 NOTE — ED Provider Notes (Signed)
 [facility]    CSN: [id number] Arrival date & time: 11/15/23  1004      History   Chief Complaint Chief Complaint  Patient presents with   Arm Injury    HPI Steven Blair is a 78 y.o. male.   Patient presents today for scraping his left forearm on a door while losing his balance.  Patient states that he is on blood thinners and had a little bit of bleeding and skin tear and was concerned.  Patient denies any loss of consciousness did not hit his head did not completely fall was able to catch himself.  Placed a bandage to area and bleeding controlled prior to arrival    Past Medical History:  Diagnosis Date   Arrhythmia    Arthritis    hands shoulders, hips   BMI 40.0-44.9, adult (HCC)    Greater than 40 BMI   DVT of leg (deep venous thrombosis) (HCC)    left leg tx. 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   Avulsion fracture of condyle of femur (HCC) 03/02/2019   Closed avulsion fracture of lesser trochanter of femur, right, initial encounter (HCC) 03/01/2019   Hematuria 03/01/2019   Nephrolithiasis 03/01/2019   Cholelithiasis 03/01/2019   Secondary hypercoagulable state 12/23/2018   Atrial fibrillation (HCC) 10/14/2018   Inability to walk 10/19/2017   Hip pain 10/19/2017   Left hip pain 10/19/2017   Protein-calorie malnutrition, severe 11/01/2014   Lower GI bleed 10/31/2014   Colitis 10/31/2014   History of DVT of lower extremity 10/31/2014   Sleep apnea    Hypertension     Past Surgical History:  Procedure Laterality Date   CARDIOVERSION      tx Paroxysmal Atrial Fibrillation- converted to NSR   CARDIOVERSION N/A 11/06/2018   Procedure: CARDIOVERSION;  Surgeon: Kate Lonni CROME, MD;  Location: [facility] ENDOSCOPY;  Service: Endoscopy;  Laterality: N/A;   COLONOSCOPY W/ POLYPECTOMY     EYE SURGERY Right 2016   FEMUR SURGERY Left    ORIF 10'05   FLEXIBLE SIGMOIDOSCOPY N/A 11/01/2014   Procedure: FLEXIBLE SIGMOIDOSCOPY;  Surgeon: Gladis MARLA Louder, MD;  Location: [facility] ENDOSCOPY;  Service: Endoscopy;  Laterality: N/A;   JOINT REPLACEMENT     BTHA   TOTAL HIP REVISION Right 08/19/2019   Procedure: TOTAL HIP REVISION;  Surgeon: Ernie Cough, MD;  Location: [facility] ORS;  Service: Orthopedics;  Laterality: Right;  2 hrs       Home Medications    Prior to Admission medications   Medication Sig Start Date End Date Taking? Authorizing Provider  cephALEXin (KEFLEX) 500 MG capsule Take 1 capsule (500 mg total) by mouth 4 (four) times daily. 11/15/23  Yes Merilee Andrea CROME, NP  Cholecalciferol (VITAMIN D3) 50 MCG (2000 UT) capsule Take 2,000 Units by mouth daily.    [provider]  dabigatran  (PRADAXA ) 150 MG CAPS capsule Take 1 capsule (150 mg total) by mouth 2 (two) times daily. 08/29/23  Trudy Birmingham, PA-C  diltiazem  (CARDIZEM  CD) 120 MG 24 hr capsule Take 1 capsule (120 mg total) by mouth daily. 08/21/23   O'NealDarryle Ned, MD  mesalamine  (LIALDA ) 1.2 g EC tablet Take 2.4 g by mouth daily. 02/01/19   [provider]  rosuvastatin (CRESTOR) 10 MG tablet Take 10 mg by mouth daily.    [provider]    Family History Family History  Problem Relation Age of Onset   Lung cancer Brother     Social History Social History   Tobacco Use   Smoking status: Never   Smokeless tobacco: Never  Vaping Use   Vaping status: Never Used  Substance Use Topics   Alcohol use: Yes    Alcohol/week: 3.0 standard drinks of alcohol    Types: 1 Glasses of wine, 2 Cans of beer per week    Comment: weekend   Drug  use: No     Allergies   Sulfa antibiotics   Review of Systems Review of Systems  Constitutional: Negative.   Respiratory: Negative.    Cardiovascular: Negative.   Gastrointestinal: Negative.   Genitourinary: Negative.   Skin:  Positive for wound.       Left forearm approximately 4 cm long 2 cm wide skin tear bleeding controlled     Physical Exam Triage Vital Signs ED Triage Vitals  Encounter Vitals Group     BP 11/15/23 1022 (!) 161/92     Girls Systolic BP Percentile --      Girls Diastolic BP Percentile --      Boys Systolic BP Percentile --      Boys Diastolic BP Percentile --      Pulse Rate 11/15/23 1022 85     Resp 11/15/23 1022 19     Temp 11/15/23 1022 98.6 F (37 C)     Temp Source 11/15/23 1022 Oral     SpO2 11/15/23 1022 98 %     Weight --      Height --      Head Circumference --      Peak Flow --      Pain Score 11/15/23 1021 0     Pain Loc --      Pain Education --      Exclude from Growth Chart --    No data found.  Updated Vital Signs BP (!) 161/92 (BP Location: Right Arm)   Pulse 85   Temp 98.6 F (37 C) (Oral)   Resp 19   SpO2 98%   Visual Acuity Right Eye Distance:   Left Eye Distance:   Bilateral Distance:    Right Eye Near:   Left Eye Near:    Bilateral Near:     Physical Exam Constitutional:      Appearance: Normal appearance.  Cardiovascular:     Rate and Rhythm: Normal rate.  Pulmonary:     Effort: Pulmonary effort is normal.  Abdominal:     General: Abdomen is flat.  Musculoskeletal:        General: Normal range of motion.     Comments: Denies any injury or pain to hand or arm or extremity  Skin:    Capillary Refill: Capillary refill takes 2 to 3 seconds.     Comments: 4 x 2 skin tear to the left forearm bleeding controlled no tissue damage.  Neurological:     General: No focal deficit present.     Mental Status: He is alert.      UC  Treatments / Results  Labs (all labs ordered are listed, but only  abnormal results are displayed) Labs Reviewed - No data to display  EKG   Radiology No results found.  Procedures Procedures (including critical care time)  Medications Ordered in UC Medications - No data to display  Initial Impression / Assessment and Plan / UC Course  I have reviewed the triage vital signs and the nursing notes.  Pertinent labs & imaging results that were available during my care of the patient were reviewed by me and considered in my medical decision making (see chart for details).     Wash dry area well apply Neosporin daily Keep covered with a nonstick Tegaderm and Coban avoid bandages as this is going to reopen the wound We will cover with a few days of antibiotics due to risk of infection Keep covered 5 to 7 days Follow-up with your primary care Final Clinical Impressions(s) / UC Diagnoses   Final diagnoses:  Injury of left upper extremity, initial encounter  Skin tear of left forearm without complication, initial encounter     Discharge Instructions      Wash dry area well apply Neosporin daily Keep covered with a nonstick Tegaderm and Coban avoid bandages as this is going to reopen the wound We will cover with a few days of antibiotics due to risk of infection Keep covered 5 to 7 days Follow-up with your primary care     ED Prescriptions     Medication Sig Dispense Auth. Provider   cephALEXin (KEFLEX) 500 MG capsule Take 1 capsule (500 mg total) by mouth 4 (four) times daily. 20 capsule Merilee Andrea CROME, NP      PDMP not reviewed this encounter.   Merilee Andrea CROME, NP 11/15/23 1050

## 2023-11-19 ENCOUNTER — Ambulatory Visit (HOSPITAL_COMMUNITY)
Admission: RE | Admit: 2023-11-19 | Discharge: 2023-11-19 | Disposition: A | Discharge status: Home / Self Care | Source: Ambulatory Visit | Attending: Emergency Medicine | Admitting: Emergency Medicine

## 2023-11-19 ENCOUNTER — Encounter (HOSPITAL_COMMUNITY): Payer: Self-pay

## 2023-11-19 VITALS — BP 145/84 | HR 89 | Temp 98.4°F | Resp 16

## 2023-11-19 DIAGNOSIS — Z5189 Encounter for other specified aftercare: Secondary | ICD-10-CM

## 2023-11-19 NOTE — Discharge Instructions (Signed)
 Continue taking the antibiotic until finished. Also continue applying antibiotic ointment to the area for infection prevention. Keep the area clean dry and covered for additional infection prevention. Follow-up with your primary care provider or return here as needed.

## 2023-11-19 NOTE — ED Triage Notes (Signed)
 Patient here today for follow up of a wound on his left forearm. Patient states that he has noticed a lot of improvement. Patient is going out of town and wanted it to be looked at prior to his trip.

## 2023-11-19 NOTE — ED Provider Notes (Signed)
 [facility]    CSN: [id number] Arrival date & time: 11/19/23  9140      History   Chief Complaint Chief Complaint  Patient presents with   Wound Check    HPI Steven Blair is a 78 y.o. male.   Patient presents for recheck of wound that he was previously evaluated for on 10/25.  Patient has a skin tear to his left forearm that initially occurred on 10/24.  Patient states he has been taking the cephalexin as prescribed for infection prevention and keeping the area clean, dry, and covered and applying antibiotic ointment with each dressing change.  Patient states the area seems to be healing and denies any redness, swelling, or drainage.  Patient also denies any fever, body aches, and chills.  The history is provided by the patient and medical records.  Wound Check    Past Medical History:  Diagnosis Date   Arrhythmia    Arthritis    hands shoulders, hips   BMI 40.0-44.9, adult (HCC)    Greater than 40 BMI   DVT of leg (deep venous thrombosis) (HCC)    left leg tx. 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   Avulsion fracture of condyle of femur (HCC) 03/02/2019   Closed avulsion fracture of lesser trochanter of femur, right, initial encounter (HCC) 03/01/2019   Hematuria 03/01/2019   Nephrolithiasis 03/01/2019   Cholelithiasis 03/01/2019   Secondary hypercoagulable state 12/23/2018   Atrial fibrillation (HCC) 10/14/2018   Inability to walk 10/19/2017   Hip pain 10/19/2017   Left hip pain 10/19/2017   Protein-calorie malnutrition, severe 11/01/2014    Lower GI bleed 10/31/2014   Colitis 10/31/2014   History of DVT of lower extremity 10/31/2014   Sleep apnea    Hypertension     Past Surgical History:  Procedure Laterality Date   CARDIOVERSION     tx Paroxysmal Atrial Fibrillation- converted to NSR   CARDIOVERSION N/A 11/06/2018   Procedure: CARDIOVERSION;  Surgeon: Kate Lonni CROME, MD;  Location[facility] ENDOSCOPY;  Service: Endoscopy;  Laterality: N/A;   COLONOSCOPY W/ POLYPECTOMY     EYE SURGERY Right 2016   FEMUR SURGERY Left    ORIF 10'05   FLEXIBLE SIGMOIDOSCOPY N/A 11/01/2014   Procedure: FLEXIBLE SIGMOIDOSCOPY;  Surgeon: Gladis MARLA Louder, MD;  Location: [facility] END[facility];  Service: Endoscopy;  Laterality: N/A;   JOINT REPLACEMENT     BTHA   TOTAL HIP REVISION Right 08/19/2019   Procedure: TOTAL HIP REVISION;  Surgeon: Ernie Cough, MD;  Location: [facility];  Service: Orthopedics;  Laterality: Right;  2 hrs       Home Medications    Prior to Admission medications   Medication Sig Start Date End Date Taking? Authorizing Provider  cephALEXin (KEFLEX) 500 MG capsule Take 1 capsule (500 mg total) by mouth 4 (four) times daily. 11/15/23   Merilee Andrea CROME, NP  Cholecalciferol (VITAMIN D3) 50 MCG (2000 UT) capsule Take 2,000 Units by mouth  daily.    [provider]  dabigatran  (PRADAXA ) 150 MG CAPS capsule Take 1 capsule (150 mg total) by mouth 2 (two) times daily. 08/29/23   Trudy [place], PA-C  diltiazem  (CARDIZEM  CD) 120 MG 24 hr capsule Take 1 capsule (120 mg total) by mouth daily. 08/21/23   O'NealDarryle Ned, MD  mesalamine  (LIALDA ) 1.2 g EC tablet Take 2.4 g by mouth daily. 02/01/19   [provider]  rosuvastatin (CRESTOR) 10 MG tablet Take 10 mg by mouth daily.    [provider]    Family History Family History  Problem Relation Age of Onset   Lung cancer Brother     Social History Social History   Tobacco Use   Smoking status: Never   Smokeless tobacco: Never  Vaping Use    Vaping status: Never Used  Substance Use Topics   Alcohol use: Yes    Alcohol/week: 3.0 standard drinks of alcohol    Types: 1 Glasses of wine, 2 Cans of beer per week    Comment: weekend   Drug use: No     Allergies   Sulfa antibiotics   Review of Systems Review of Systems  Per HPI  Physical Exam Triage Vital Signs ED Triage Vitals  Encounter Vitals Group     BP 11/19/23 0929 (!) 145/84     Girls Systolic BP Percentile --      Girls Diastolic BP Percentile --      Boys Systolic BP Percentile --      Boys Diastolic BP Percentile --      Pulse Rate 11/19/23 0929 89     Resp 11/19/23 0929 16     Temp 11/19/23 0929 98.4 F (36.9 C)     Temp Source 11/19/23 0929 Oral     SpO2 11/19/23 0929 98 %     Weight --      Height --      Head Circumference --      Peak Flow --      Pain Score 11/19/23 0930 0     Pain Loc --      Pain Education --      Exclude from Growth Chart --    No data found.  Updated Vital Signs BP (!) 145/84 (BP Location: Right Arm)   Pulse 89   Temp 98.4 F (36.9 C) (Oral)   Resp 16   SpO2 98%   Visual Acuity Right Eye Distance:   Left Eye Distance:   Bilateral Distance:    Right Eye Near:   Left Eye Near:    Bilateral Near:     Physical Exam Vitals and nursing note reviewed.  Constitutional:      General: He is awake. He is not in acute distress.    Appearance: Normal appearance. He is well-developed and well-groomed. He is not ill-appearing.  Skin:    General: Skin is warm and dry.     Comments: 4 cm x 2 cm skin tear to left posterior forearm.  Bleeding is controlled.  Without deep tissue injury.  Without redness, swelling, or purulent drainage.  Neurological:     Mental Status: He is alert.  Psychiatric:        Behavior: Behavior is cooperative.      UC Treatments / Results  Labs (all labs ordered are listed, but only abnormal results are displayed) Labs Reviewed - No data to display  EKG   Radiology No results  found.  Procedures Procedures (including critical  care time)  Medications Ordered in UC Medications - No data to display  Initial Impression / Assessment and Plan / UC Course  I have reviewed the triage vital signs and the nursing notes.  Pertinent labs & imaging results that were available during my care of the patient were reviewed by me and considered in my medical decision making (see chart for details).     Patient is overall well-appearing.  Vitals are stable.  No signs of infection.  No signs of infection to wound at this time.  Wound appears to be healing appropriately.  Recommended continuing cephalexin for infection coverage.  Reminded patient of proper wound care.  Discussed follow-up and return precautions. Final Clinical Impressions(s) / UC Diagnoses   Final diagnoses:  Encounter for wound re-check     Discharge Instructions      Continue taking the antibiotic until finished. Also continue applying antibiotic ointment to the area for infection prevention. Keep the area clean dry and covered for additional infection prevention. Follow-up with your primary care provider or return here as needed.   ED Prescriptions   None    PDMP not reviewed this encounter.   Johnie Flaming A, NP 11/19/23 [phone]

## 2023-12-31 ENCOUNTER — Encounter (HOSPITAL_BASED_OUTPATIENT_CLINIC_OR_DEPARTMENT_OTHER): Payer: Self-pay | Admitting: Emergency Medicine

## 2023-12-31 ENCOUNTER — Other Ambulatory Visit: Payer: Self-pay

## 2023-12-31 ENCOUNTER — Emergency Department (HOSPITAL_BASED_OUTPATIENT_CLINIC_OR_DEPARTMENT_OTHER): Admitting: Radiology

## 2023-12-31 ENCOUNTER — Inpatient Hospital Stay (HOSPITAL_BASED_OUTPATIENT_CLINIC_OR_DEPARTMENT_OTHER)
Admission: EM | Admit: 2023-12-31 | Discharge: 2024-01-08 | DRG: 536 | Disposition: A | Discharge status: Skilled Nursing Facility | Attending: Internal Medicine | Admitting: Internal Medicine

## 2023-12-31 DIAGNOSIS — Y9234 Swimming pool (public) as the place of occurrence of the external cause: Secondary | ICD-10-CM | POA: Diagnosis not present

## 2023-12-31 DIAGNOSIS — Z86718 Personal history of other venous thrombosis and embolism: Secondary | ICD-10-CM | POA: Diagnosis not present

## 2023-12-31 DIAGNOSIS — S72002A Fracture of unspecified part of neck of left femur, initial encounter for closed fracture: Secondary | ICD-10-CM

## 2023-12-31 DIAGNOSIS — S72002D Fracture of unspecified part of neck of left femur, subsequent encounter for closed fracture with routine healing: Secondary | ICD-10-CM | POA: Diagnosis not present

## 2023-12-31 DIAGNOSIS — I4811 Longstanding persistent atrial fibrillation: Secondary | ICD-10-CM | POA: Diagnosis not present

## 2023-12-31 DIAGNOSIS — Z7901 Long term (current) use of anticoagulants: Secondary | ICD-10-CM | POA: Diagnosis not present

## 2023-12-31 DIAGNOSIS — S72112A Displaced fracture of greater trochanter of left femur, initial encounter for closed fracture: Principal | ICD-10-CM | POA: Diagnosis present

## 2023-12-31 DIAGNOSIS — Z96641 Presence of right artificial hip joint: Secondary | ICD-10-CM | POA: Diagnosis present

## 2023-12-31 DIAGNOSIS — E669 Obesity, unspecified: Secondary | ICD-10-CM | POA: Diagnosis present

## 2023-12-31 DIAGNOSIS — G4733 Obstructive sleep apnea (adult) (pediatric): Secondary | ICD-10-CM | POA: Diagnosis present

## 2023-12-31 DIAGNOSIS — S72001A Fracture of unspecified part of neck of right femur, initial encounter for closed fracture: Secondary | ICD-10-CM | POA: Diagnosis not present

## 2023-12-31 DIAGNOSIS — Z751 Person awaiting admission to adequate facility elsewhere: Secondary | ICD-10-CM

## 2023-12-31 DIAGNOSIS — W010XXA Fall on same level from slipping, tripping and stumbling without subsequent striking against object, initial encounter: Secondary | ICD-10-CM | POA: Diagnosis present

## 2023-12-31 DIAGNOSIS — E860 Dehydration: Secondary | ICD-10-CM | POA: Diagnosis not present

## 2023-12-31 DIAGNOSIS — Z882 Allergy status to sulfonamides status: Secondary | ICD-10-CM | POA: Diagnosis not present

## 2023-12-31 DIAGNOSIS — I4821 Permanent atrial fibrillation: Secondary | ICD-10-CM | POA: Diagnosis present

## 2023-12-31 DIAGNOSIS — K5190 Ulcerative colitis, unspecified, without complications: Secondary | ICD-10-CM | POA: Diagnosis present

## 2023-12-31 DIAGNOSIS — Z87442 Personal history of urinary calculi: Secondary | ICD-10-CM

## 2023-12-31 DIAGNOSIS — G4730 Sleep apnea, unspecified: Secondary | ICD-10-CM | POA: Diagnosis present

## 2023-12-31 DIAGNOSIS — I10 Essential (primary) hypertension: Secondary | ICD-10-CM | POA: Diagnosis present

## 2023-12-31 DIAGNOSIS — M9702XA Periprosthetic fracture around internal prosthetic left hip joint, initial encounter: Secondary | ICD-10-CM | POA: Diagnosis present

## 2023-12-31 DIAGNOSIS — Z801 Family history of malignant neoplasm of trachea, bronchus and lung: Secondary | ICD-10-CM | POA: Diagnosis not present

## 2023-12-31 DIAGNOSIS — Z6831 Body mass index (BMI) 31.0-31.9, adult: Secondary | ICD-10-CM

## 2023-12-31 DIAGNOSIS — Z79899 Other long term (current) drug therapy: Secondary | ICD-10-CM

## 2023-12-31 DIAGNOSIS — M5136 Other intervertebral disc degeneration, lumbar region with discogenic back pain only: Secondary | ICD-10-CM | POA: Diagnosis present

## 2023-12-31 DIAGNOSIS — E785 Hyperlipidemia, unspecified: Secondary | ICD-10-CM | POA: Diagnosis present

## 2023-12-31 DIAGNOSIS — I4891 Unspecified atrial fibrillation: Secondary | ICD-10-CM | POA: Diagnosis present

## 2023-12-31 DIAGNOSIS — I739 Peripheral vascular disease, unspecified: Secondary | ICD-10-CM | POA: Diagnosis present

## 2023-12-31 DIAGNOSIS — S72142A Displaced intertrochanteric fracture of left femur, initial encounter for closed fracture: Secondary | ICD-10-CM | POA: Diagnosis present

## 2023-12-31 DIAGNOSIS — S72009A Fracture of unspecified part of neck of unspecified femur, initial encounter for closed fracture: Secondary | ICD-10-CM | POA: Diagnosis present

## 2023-12-31 LAB — CBC WITH DIFFERENTIAL/PLATELET
Abs Immature Granulocytes: 0.05 K/uL (ref 0.00–0.07)
Basophils Absolute: 0 K/uL (ref 0.0–0.1)
Basophils Relative: 0 %
Eosinophils Absolute: 0 K/uL (ref 0.0–0.5)
Eosinophils Relative: 0 %
HCT: 44.6 % (ref 39.0–52.0)
Hemoglobin: 14.9 g/dL (ref 13.0–17.0)
Immature Granulocytes: 1 %
Lymphocytes Relative: 12 %
Lymphs Abs: 1.1 K/uL (ref 0.7–4.0)
MCH: 35.3 pg — ABNORMAL HIGH (ref 26.0–34.0)
MCHC: 33.4 g/dL (ref 30.0–36.0)
MCV: 105.7 fL — ABNORMAL HIGH (ref 80.0–100.0)
Monocytes Absolute: 0.5 K/uL (ref 0.1–1.0)
Monocytes Relative: 6 %
Neutro Abs: 7.6 K/uL (ref 1.7–7.7)
Neutrophils Relative %: 81 %
Platelets: 138 K/uL — ABNORMAL LOW (ref 150–400)
RBC: 4.22 MIL/uL (ref 4.22–5.81)
RDW: 12.4 % (ref 11.5–15.5)
WBC: 9.3 K/uL (ref 4.0–10.5)
nRBC: 0 % (ref 0.0–0.2)

## 2023-12-31 LAB — BASIC METABOLIC PANEL WITH GFR
Anion gap: 12 (ref 5–15)
BUN: 27 mg/dL — ABNORMAL HIGH (ref 8–23)
CO2: 26 mmol/L (ref 22–32)
Calcium: 9.7 mg/dL (ref 8.9–10.3)
Chloride: 101 mmol/L (ref 98–111)
Creatinine, Ser: 0.88 mg/dL (ref 0.61–1.24)
GFR, Estimated: >60 mL/min (ref >60)
Glucose, Bld: 119 mg/dL — ABNORMAL HIGH (ref 70–99)
Potassium: 4.4 mmol/L (ref 3.5–5.1)
Sodium: 139 mmol/L (ref 135–145)

## 2023-12-31 MED ORDER — MORPHINE SULFATE (PF) 4 MG/ML IV SOLN
4.0000 mg | Freq: Once | INTRAVENOUS | Status: AC
  Administered 2023-12-31: 4 mg via INTRAVENOUS
  Filled 2023-12-31: qty 1

## 2023-12-31 MED ORDER — MESALAMINE 1.2 G PO TBEC
2.4000 g | DELAYED_RELEASE_TABLET | Freq: Every day | ORAL | Status: DC
  Administered 2023-12-31 – 2024-01-08 (×9): 2.4 g via ORAL
  Filled 2023-12-31 (×9): qty 2

## 2023-12-31 MED ORDER — DILTIAZEM HCL ER COATED BEADS 120 MG PO CP24
120.0000 mg | ORAL_CAPSULE | Freq: Every day | ORAL | Status: DC
  Administered 2023-12-31 – 2024-01-08 (×9): 120 mg via ORAL
  Filled 2023-12-31 (×9): qty 1

## 2023-12-31 MED ORDER — HYDROMORPHONE HCL 1 MG/ML IJ SOLN
0.5000 mg | INTRAMUSCULAR | Status: DC | PRN
  Administered 2023-12-31 – 2024-01-07 (×8): 0.5 mg via INTRAVENOUS
  Filled 2023-12-31 (×9): qty 0.5

## 2023-12-31 MED ORDER — ONDANSETRON HCL 4 MG/2ML IJ SOLN
4.0000 mg | Freq: Once | INTRAMUSCULAR | Status: AC
  Administered 2023-12-31: 4 mg via INTRAVENOUS
  Filled 2023-12-31: qty 2

## 2023-12-31 MED ORDER — SODIUM CHLORIDE 0.9 % IV SOLN: INTRAVENOUS | Status: DC

## 2023-12-31 MED ORDER — ONDANSETRON HCL 4 MG PO TABS: 4.0000 mg | ORAL_TABLET | Freq: Four times a day (QID) | ORAL | Status: DC | PRN

## 2023-12-31 MED ORDER — ONDANSETRON HCL 4 MG/2ML IJ SOLN: 4.0000 mg | Freq: Four times a day (QID) | INTRAMUSCULAR | Status: DC | PRN

## 2023-12-31 NOTE — ED Provider Notes (Signed)
  EMERGENCY DEPARTMENT AT [facility] Provider Note   CSN: [id number] Arrival date & time: 12/31/23  1505     Patient presents with: Steven Blair is a 78 y.o. male.   78 yo M with a chief complaints of a fall.  Patient swam today and when he went to the locker room he slipped on the floor.  Landed on his left hip.  Had a prior hip fracture that status post repair and revision.  Done by Dr. Ernie.  He was unable to bear weight afterwards and was brought here for evaluation.  He denies other injury.   Fall       Prior to Admission medications   Medication Sig Start Date End Date Taking? Authorizing Provider  cephALEXin  (KEFLEX ) 500 MG capsule Take 1 capsule (500 mg total) by mouth 4 (four) times daily. 11/15/23   Merilee Andrea CROME, NP  Cholecalciferol (VITAMIN D3) 50 MCG (2000 UT) capsule Take 2,000 Units by mouth daily.    [provider]  dabigatran  (PRADAXA ) 150 MG CAPS capsule Take 1 capsule (150 mg total) by mouth 2 (two) times daily. 08/29/23   Trudy Birmingham, PA-C  diltiazem  (CARDIZEM  CD) 120 MG 24 hr capsule Take 1 capsule (120 mg total) by mouth daily. 08/21/23   O'NealDarryle Debby, MD  mesalamine  (LIALDA ) 1.2 g EC tablet Take 2.4 g by mouth daily. 02/01/19   [provider]  rosuvastatin (CRESTOR) 10 MG tablet Take 10 mg by mouth daily.    [provider]    Allergies: Sulfa antibiotics    Review of Systems  Updated Vital Signs BP (!) 151/95   Pulse (!) 105   Temp 98 F (36.7 C)   Resp 20   Ht 6' (1.829 m)   Wt 106.1 kg   SpO2 100%   BMI 31.74 kg/m   Physical Exam Vitals and nursing note reviewed.  Constitutional:      Appearance: He is well-developed.  HENT:     Head: Normocephalic and atraumatic.  Eyes:     Pupils: Pupils are equal, round, and reactive to light.  Neck:     Vascular: No JVD.  Cardiovascular:     Rate and Rhythm: Normal rate and regular rhythm.     Heart sounds: No murmur  heard.    No friction rub. No gallop.  Pulmonary:     Effort: No respiratory distress.     Breath sounds: No wheezing.  Abdominal:     General: There is no distension.     Tenderness: There is no abdominal tenderness. There is no guarding or rebound.  Musculoskeletal:        General: Normal range of motion.     Cervical back: Normal range of motion and neck supple.     Comments: Pulse motor and sensation intact to left lower extremity.  Pain with internal/external rotation of the left leg.  Points to the left greater trochanter as area of discomfort.  Skin:    Coloration: Skin is not pale.     Findings: No rash.  Neurological:     Mental Status: He is alert and oriented to person, place, and time.  Psychiatric:        Behavior: Behavior normal.     (all labs ordered are listed, but only abnormal results are displayed) Labs Reviewed  CBC WITH DIFFERENTIAL/PLATELET - Abnormal; Notable for the following components:      Result Value   MCV 105.7 (*)  MCH 35.3 (*)    Platelets 138 (*)    All other components within normal limits  BASIC METABOLIC PANEL WITH GFR - Abnormal; Notable for the following components:   Glucose, Bld 119 (*)    BUN 27 (*)    All other components within normal limits    EKG: None  Radiology: DG FEMUR 1V LEFT Result Date: 12/31/2023 CLINICAL DATA:  Fall EXAM: LEFT FEMUR 1 VIEW COMPARISON:  None Available. FINDINGS: Status post left hip replacement with normal alignment. Surgical plate, fixating screws and cerclage wires at the shaft of the femur. Acute mildly displaced periprosthetic fracture involving the trochanteric region of left femur. Vascular calcifications. IMPRESSION: Status post left hip replacement, surgical plate, fixating screws and cerclage wires of left femur with interval acute mildly displaced periprosthetic fracture involving the trochanteric region of the left femur. Electronically Signed   By: Luke Bun M.D.   On: 12/31/2023 16:47    DG Hip Unilat With Pelvis 2-3 Views Left Result Date: 12/31/2023 CLINICAL DATA:  Fall EXAM: DG HIP (WITH OR WITHOUT PELVIS) 2-3V LEFT COMPARISON:  03/01/2019 FINDINGS: SI joints are non widened. Pubic symphysis and rami appear intact. Bilateral hip replacements with normal alignment. Surgical plate, screw and cerclage wire fixation of the femoral shaft across old fracture deformity. Interval acute mildly displaced periprosthetic fracture involving the trochanteric region of the left femur. Vascular calcifications. IMPRESSION: Acute mildly displaced periprosthetic fracture involving the trochanteric region of the left femur. Electronically Signed   By: Luke Bun M.D.   On: 12/31/2023 16:46     Procedures   Medications Ordered in the ED  morphine  (PF) 4 MG/ML injection 4 mg (4 mg Intravenous Given 12/31/23 1754)  ondansetron  (ZOFRAN ) injection 4 mg (4 mg Intravenous Given 12/31/23 1752)  morphine  (PF) 4 MG/ML injection 4 mg (4 mg Intravenous Given 12/31/23 1955)                                    Medical Decision Making Amount and/or Complexity of Data Reviewed Labs: ordered. Radiology: ordered. ECG/medicine tests: ordered.  Risk Prescription drug management. Decision regarding hospitalization.   78 yo M with a chief complaint of a fall.  Nonsyncopal by history complaining of left leg pain.  Patient is status post left femur repair.  On my independent interpretation of the plain film concern for periprosthetic fracture.  I discussed this with Dr. Hiram, he recommends admission to [facility] through the hospital service.  No anemia.  No obvious electrolyte abnormalities.   8:36 PM:  I have discussed the diagnosis/risks/treatment options with the patient.  Evaluation and diagnostic testing in the emergency department does not suggest an emergent condition requiring admission or immediate intervention beyond what has been performed at this time.  They will follow up with PCP. We  also discussed returning to the ED immediately if new or worsening sx occur. We discussed the sx which are most concerning (e.g., sudden worsening pain, fever, inability to tolerate by mouth) that necessitate immediate return. Medications administered to the patient during their visit and any new prescriptions provided to the patient are listed below.  Medications given during this visit Medications  morphine  (PF) 4 MG/ML injection 4 mg (4 mg Intravenous Given 12/31/23 1754)  ondansetron  (ZOFRAN ) injection 4 mg (4 mg Intravenous Given 12/31/23 1752)  morphine  (PF) 4 MG/ML injection 4 mg (4 mg Intravenous Given 12/31/23 1955)  The patient appears reasonably screen and/or stabilized for discharge and I doubt any other medical condition or other [facility] requiring further screening, evaluation, or treatment in the ED at this time prior to discharge.       Final diagnoses:  Closed displaced intertrochanteric fracture of left femur, initial encounter [facility])    ED Discharge Orders     None          Emil Share, DO 12/31/23 2036

## 2023-12-31 NOTE — ED Triage Notes (Signed)
 Pt via pov from [facility] after a fall. He states that he slipped on a wet floor and fell on his left hip. Pt states he has had hip replacement, broken femur (has rod and screws). Pt denies head injury or LOC. Pt very concerned about the hip. Pt a&o x 4; nad noted.

## 2023-12-31 NOTE — H&P (Signed)
 History and Physical    Patient: Steven Blair:[id number] DOB: February 23, 1945 DOA: 12/31/2023 DOS: the patient was seen and examined on 12/31/2023 PCP: Charlott Dorn LABOR, MD  Patient coming from: Home  Chief Complaint:  Chief Complaint  Patient presents with   Fall   HPI: Steven Blair is a 78 y.o. male with medical history significant of atrial fibrillation, history of DVT, dysrhythmia, previous fracture RIGHT femur, essential hypertension, peripheral vascular disease, ulcerative colitis, who was apparently swimming today after missing he slipped on a wet floor and fell on his left hip.  He has had bilateral hip replacement and previous surgeries before.  He has rod and screws there.  He immediately felt severe pain on the left side.  Did not hit his head.  Was brought in where he was seen evaluated and appears to have [facility] prostatic left femoral neck fracture.  Patient was transferred to here for possible surgical repair.  Previous surgeries were performed by Dr. Applegate.  Case was discussed with Dr. Bernarda who recommends admission to medicine now and they will follow-up for possible surgery.  Review of Systems: As mentioned in the history of present illness. All other systems reviewed and are negative. Past Medical History:  Diagnosis Date   Arrhythmia    Arthritis    hands shoulders, hips   BMI 40.0-44.9, adult (HCC)    Greater than 40 BMI   DVT of leg (deep venous thrombosis) (HCC)    left leg tx. Xarelto - no longer taking   Dysrhythmia    Atrial Fib x1 episode, cardioversion 4'16- NSR   Fracture of trochanter of right femur (HCC) 02/2019   History of kidney stones    x1 -20 yrs ago   Hypertension    Peripheral vascular disease    DVT 2014   Ulcerative colitis (HCC)    history of , some current issues at this time   Past Surgical History:  Procedure Laterality Date   CARDIOVERSION     tx Paroxysmal Atrial Fibrillation- converted to NSR   CARDIOVERSION N/A  11/06/2018   Procedure: CARDIOVERSION;  Surgeon: Kate Lonni CROME, MD;  Location: [facility] ENDOSCOPY;  Service: Endoscopy;  Laterality: N/A;   COLONOSCOPY W/ POLYPECTOMY     EYE SURGERY Right 2016   FEMUR SURGERY Left    ORIF 10'05   FLEXIBLE SIGMOIDOSCOPY N/A 11/01/2014   Procedure: FLEXIBLE SIGMOIDOSCOPY;  Surgeon: Gladis MARLA Louder, MD;  Location: [facility] ENDOSCOPY;  Service: Endoscopy;  Laterality: N/A;   JOINT REPLACEMENT     BTHA   TOTAL HIP REVISION Right 08/19/2019   Procedure: TOTAL HIP REVISION;  Surgeon: Ernie Cough, MD;  Location: [facility] ORS;  Service: Orthopedics;  Laterality: Right;  2 hrs   Social History:  reports that he has never smoked. He has never used smokeless tobacco. He reports current alcohol use of about 2.0 standard drinks of alcohol per week. He reports that he does not use drugs.  Allergies  Allergen Reactions   Sulfa Antibiotics Other (See Comments)    Childhood allergy    Family History  Problem Relation Age of Onset   Lung cancer Brother     Prior to Admission medications   Medication Sig Start Date End Date Taking? Authorizing Provider  cephALEXin  (KEFLEX ) 500 MG capsule Take 1 capsule (500 mg total) by mouth 4 (four) times daily. 11/15/23   Merilee Andrea CROME, NP  Cholecalciferol (VITAMIN D3) 50 MCG (2000 UT) capsule Take 2,000 Units by mouth daily.  [provider]  dabigatran  (PRADAXA ) 150 MG CAPS capsule Take 1 capsule (150 mg total) by mouth 2 (two) times daily. 08/29/23   Trudy Birmingham, PA-C  diltiazem  (CARDIZEM  CD) 120 MG 24 hr capsule Take 1 capsule (120 mg total) by mouth daily. 08/21/23   O'NealDarryle Ned, MD  mesalamine  (LIALDA ) 1.2 g EC tablet Take 2.4 g by mouth daily. 02/01/19   [provider]  rosuvastatin (CRESTOR) 10 MG tablet Take 10 mg by mouth daily.    [provider]    Physical Exam: Vitals:   12/31/23 1930 12/31/23 1945 12/31/23 2000 12/31/23 2038  BP: 129/78 139/81 (!) 151/95 137/66  Pulse: 86  84 (!) 105 98  Resp:    20  Temp:    99.1 F (37.3 C)  TempSrc:    Oral  SpO2: 98% 99% 100% 100%  Weight:      Height:       Constitutional: Stable, NAD, calm, comfortable Eyes: PERRL, lids and conjunctivae normal ENMT: Mucous membranes are moist. Posterior pharynx clear of any exudate or lesions.Normal dentition.  Neck: normal, supple, no masses, no thyromegaly Respiratory: clear to auscultation bilaterally, no wheezing, no crackles. Normal respiratory effort. No accessory muscle use.  Cardiovascular: Sinus tachycardia, no murmurs / rubs / gallops. No extremity edema. 2+ pedal pulses. No carotid bruits.  Abdomen: no tenderness, no masses palpated. No hepatosplenomegaly. Bowel sounds positive.  Musculoskeletal: Decreased range of motion, tender and shortened Skin: no rashes, lesions, ulcers. No induration Neurologic: CN 2-12 grossly intact. Sensation intact, DTR normal. Strength 5/5 in all 4.  Psychiatric: Normal judgment and insight. Alert and oriented x 3. Normal mood  Data Reviewed:  Temperature 99.1, blood pressure 160/79, pulse 105, white count 9.3, platelets 138 BUN 27 glucose 198 show the left femur shows status post left hip replacement surgical plate fixation and sacral as well as a left femur with interval acute mildly displaced periprosthetic fracture involving the trochanteric region laterally x-ray showed acute mildly displaced periprosthetic fracture involving the trochanter region of the left femur  Assessment and Plan:  #1 acute left periprosthetic fracture of the left femur: Patient will be admitted.  Orthopedic consulted.  Dr. Hiram was consulted.  Patient may likely get surgery tomorrow.  Patient has been taking Pradaxa .  Last dose was early this morning.  Will defer to orthopedics as to whether patient should have surgery tomorrow or in 48 hours.  #2 atrial fibrillation: Rate is controlled.  Hold Pradaxa  but continue with rate control.  #3 history of DVT: On  Pradaxa .  Will resume that after surgery  #4 essential hypertension: Continue blood pressure medications.  #5 morbid obesity: Dietary counseling  #6 obstructive sleep apnea: Continues with CPAP at night    Advance Care Planning:   Code Status: Full Code   Consults: Dr. Hiram  Family Communication: No family at bedside  Severity of Illness: The appropriate patient status for this patient is INPATIENT. Inpatient status is judged to be reasonable and necessary in order to provide the required intensity of service to ensure the patient's safety. The patient's presenting symptoms, physical exam findings, and initial radiographic and laboratory data in the context of their chronic comorbidities is felt to place them at high risk for further clinical deterioration. Furthermore, it is not anticipated that the patient will be medically stable for discharge from the hospital within 2 midnights of admission.   * I certify that at the point of admission it is my clinical judgment that  the patient will require inpatient hospital care spanning beyond 2 midnights from the point of admission due to high intensity of service, high risk for further deterioration and high frequency of surveillance required.*  AuthorBETHA SIM KNOLL, MD 12/31/2023 9:00 PM  For on call review [email or web address].

## 2024-01-01 ENCOUNTER — Encounter (HOSPITAL_COMMUNITY): Payer: Self-pay | Admitting: Internal Medicine

## 2024-01-01 LAB — COMPREHENSIVE METABOLIC PANEL WITH GFR
ALT: 55 U/L — ABNORMAL HIGH (ref 0–44)
AST: 67 U/L — ABNORMAL HIGH (ref 15–41)
Albumin: 3.9 g/dL (ref 3.5–5.0)
Alkaline Phosphatase: 87 U/L (ref 38–126)
Anion gap: 7 (ref 5–15)
BUN: 18 mg/dL (ref 8–23)
CO2: 28 mmol/L (ref 22–32)
Calcium: 8.7 mg/dL — ABNORMAL LOW (ref 8.9–10.3)
Chloride: 104 mmol/L (ref 98–111)
Creatinine, Ser: 0.74 mg/dL (ref 0.61–1.24)
GFR, Estimated: >60 mL/min (ref >60)
Glucose, Bld: 115 mg/dL — ABNORMAL HIGH (ref 70–99)
Potassium: 4.7 mmol/L (ref 3.5–5.1)
Sodium: 139 mmol/L (ref 135–145)
Total Bilirubin: 0.7 mg/dL (ref 0.0–1.2)
Total Protein: 6.2 g/dL — ABNORMAL LOW (ref 6.5–8.1)

## 2024-01-01 LAB — CBC
HCT: 42.3 % (ref 39.0–52.0)
Hemoglobin: 14 g/dL (ref 13.0–17.0)
MCH: 35.3 pg — ABNORMAL HIGH (ref 26.0–34.0)
MCHC: 33.1 g/dL (ref 30.0–36.0)
MCV: 106.5 fL — ABNORMAL HIGH (ref 80.0–100.0)
Platelets: 121 K/uL — ABNORMAL LOW (ref 150–400)
RBC: 3.97 MIL/uL — ABNORMAL LOW (ref 4.22–5.81)
RDW: 12.5 % (ref 11.5–15.5)
WBC: 7.7 K/uL (ref 4.0–10.5)
nRBC: 0 % (ref 0.0–0.2)

## 2024-01-01 MED ORDER — OXYCODONE HCL 5 MG PO TABS
5.0000 mg | ORAL_TABLET | Freq: Four times a day (QID) | ORAL | Status: DC | PRN
  Administered 2024-01-01 – 2024-01-07 (×19): 5 mg via ORAL
  Filled 2024-01-01 (×19): qty 1

## 2024-01-01 MED ORDER — SENNOSIDES-DOCUSATE SODIUM 8.6-50 MG PO TABS
1.0000 | ORAL_TABLET | Freq: Two times a day (BID) | ORAL | Status: DC
  Administered 2024-01-03 – 2024-01-07 (×6): 1 via ORAL
  Filled 2024-01-01 (×13): qty 1

## 2024-01-01 MED ORDER — ROSUVASTATIN CALCIUM 10 MG PO TABS: 10.0000 mg | ORAL_TABLET | Freq: Every day | ORAL | Status: DC

## 2024-01-01 MED ORDER — DABIGATRAN ETEXILATE MESYLATE 150 MG PO CAPS
150.0000 mg | ORAL_CAPSULE | Freq: Two times a day (BID) | ORAL | Status: DC
  Administered 2024-01-01 – 2024-01-08 (×14): 150 mg via ORAL
  Filled 2024-01-01 (×16): qty 1

## 2024-01-01 MED ORDER — POLYETHYLENE GLYCOL 3350 17 G PO PACK
17.0000 g | PACK | Freq: Every day | ORAL | Status: DC
  Administered 2024-01-03 – 2024-01-07 (×3): 17 g via ORAL
  Filled 2024-01-01 (×8): qty 1

## 2024-01-01 NOTE — Progress Notes (Signed)
 OT Cancellation Note  Patient Details Name: Steven Blair MRN: [id number] DOB: 03/23/1945   Cancelled Treatment:    Reason Eval/Treat Not Completed: Other (comment)  Chart review patient for potential ORIF/surgery. OT will await post op OT orders. Thank you for this referral.   Geni OT/L Acute Rehabilitation Department  [phone]   01/01/2024, 1:11 PM

## 2024-01-01 NOTE — Progress Notes (Addendum)
 PT Cancellation Note  Patient Details Name: Steven Blair MRN: [id number] DOB: [birth year]   Cancelled Treatment:    Reason Eval/Treat Not Completed: Other (comment)Patient is to have ORIF. Will wait for post op orders. MD has clarified that patient will be non operative. PT will initiate Eval 12/12.   Darice Potters PT Acute Rehabilitation Services Office [phone]    Potters Darice Norris 01/01/2024, 12:42 PM

## 2024-01-01 NOTE — Progress Notes (Signed)
 PROGRESS NOTE  Steven Blair  FMW:[id number] DOB: 04-16-1945 DOA: 12/31/2023 PCP: Charlott Dorn LABOR, MD   Brief Narrative: Patient is a 78 year old male with history of permanent atrial fibrillation, DVT, hypertension, peripheral vascular disease, ulcerative colitis who fell on the wet floor of the locker room at swimming pool .  He fell on his left hip and immediately developed pain.  No head injury.  Imaging showed periprosthetic left femoral neck fracture.  He has a history of femur fracture s/p repair and revision by Dr. Ernie.  Patient was unable to bear weight on presentation.  Orthopedics consulted here.  Plan for nonoperative management.  PT/OT consulted  Assessment & Plan:  Principal Problem:   Hip fracture (HCC) Active Problems:   History of DVT of lower extremity   Sleep apnea   Hypertension   Atrial fibrillation (HCC)   Obese   Acute left periprosthetic fracture of the left femur: Presented with mechanical fall.  Continue pain management, supportive care.  Orthopedics not planning for surgery.  Plan for nonoperative management.  Orthopedics recommended no active abduction.  PT/OT consulted.  Continue bowel regimen  Permanent A-fib: Remains in A-fib rhythm.  Heart rate controlled.  Follows with cardiology.  On Cardizem  for rate control at home.  Takes Pradaxa  for anticoagulation  History of DVT: On Pradaxa .    Hyperlipidemia:On crestor  History of ulcerative colitis: Takes mesalamine .  Currently not on exacerbation  Hypertension: Currently blood pressure stable.  OSA: Continue CPAP at night.  Obesity: BMI 31.7        DVT prophylaxis:SCDs Start: 12/31/23 2059     Code Status: Full Code  Family Communication: None at the bedside  Patient status: Inpatient  Patient is from : Home  Anticipated discharge to: Home versus SNF  Estimated DC date: 1 to 2 days   Consultants: Orthopedic  Procedures: None  Antimicrobials:  Anti-infectives (From  admission, onward)    None       Subjective: Patient seen and examined at bedside today.  Hemodynamically stable lying in bed.  Complains of some pain on the left hip but pain is not severe at the moment.  Remains in A-fib rhythm with controlled rate  Objective: Vitals:   12/31/23 2000 12/31/23 2038 01/01/24 0105 01/01/24 0507  BP: (!) 151/95 137/66 (!) 102/57 113/67  Pulse: (!) 105 98 81 82  Resp:  20 16 16   Temp:  99.1 F (37.3 C) 97.7 F (36.5 C) 97.8 F (36.6 C)  TempSrc:  Oral Oral Oral  SpO2: 100% 100% 96% 96%  Weight:      Height:        Intake/Output Summary (Last 24 hours) at 01/01/2024 0854 Last data filed at 01/01/2024 0700 Gross per 24 hour  Intake 938.17 ml  Output 750 ml  Net 188.17 ml   Filed Weights   12/31/23 1533  Weight: 106.1 kg    Examination:  General exam: Overall comfortable, not in distress, pleasant gentleman HEENT: PERRL Respiratory system:  no wheezes or crackles  Cardiovascular system: Irregularly irregular rhythm Gastrointestinal system: Abdomen is nondistended, soft and nontender. Central nervous system: Alert and oriented Extremities: No edema, no clubbing ,no cyanosis, tenderness on the left hip Skin: No rashes, no ulcers,no icterus     Data Reviewed: I have personally reviewed following labs and imaging studies  CBC: Recent Labs  Lab 12/31/23 1743 01/01/24 0357  WBC 9.3 7.7  NEUTROABS 7.6  --   HGB 14.9 14.0  HCT 44.6 42.3  MCV  105.7* 106.5*  PLT 138* 121*   Basic Metabolic Panel: Recent Labs  Lab 12/31/23 1743 01/01/24 0357  NA 139 139  K 4.4 4.7  CL 101 104  CO2 26 28  GLUCOSE 119* 115*  BUN 27* 18  CREATININE 0.88 0.74  CALCIUM 9.7 8.7*     No results found for this or any previous visit (from the past 240 hours).   Radiology Studies: DG FEMUR 1V LEFT Result Date: 12/31/2023 CLINICAL DATA:  Fall EXAM: LEFT FEMUR 1 VIEW COMPARISON:  None Available. FINDINGS: Status post left hip replacement with  normal alignment. Surgical plate, fixating screws and cerclage wires at the shaft of the femur. Acute mildly displaced periprosthetic fracture involving the trochanteric region of left femur. Vascular calcifications. IMPRESSION: Status post left hip replacement, surgical plate, fixating screws and cerclage wires of left femur with interval acute mildly displaced periprosthetic fracture involving the trochanteric region of the left femur. Electronically Signed   By: Luke Bun M.D.   On: 12/31/2023 16:47   DG Hip Unilat With Pelvis 2-3 Views Left Result Date: 12/31/2023 CLINICAL DATA:  Fall EXAM: DG HIP (WITH OR WITHOUT PELVIS) 2-3V LEFT COMPARISON:  03/01/2019 FINDINGS: SI joints are non widened. Pubic symphysis and rami appear intact. Bilateral hip replacements with normal alignment. Surgical plate, screw and cerclage wire fixation of the femoral shaft across old fracture deformity. Interval acute mildly displaced periprosthetic fracture involving the trochanteric region of the left femur. Vascular calcifications. IMPRESSION: Acute mildly displaced periprosthetic fracture involving the trochanteric region of the left femur. Electronically Signed   By: Luke Bun M.D.   On: 12/31/2023 16:46    Scheduled Meds:  diltiazem   120 mg Oral Daily   mesalamine   2.4 g Oral Daily   Continuous Infusions:  sodium chloride  100 mL/hr at 01/01/24 0527     LOS: 1 day   Ivonne Mustache, MD [facility] P12/11/2023, 8:54 AM

## 2024-01-02 ENCOUNTER — Encounter: Payer: Self-pay | Admitting: Cardiovascular Disease

## 2024-01-02 DIAGNOSIS — S72001A Fracture of unspecified part of neck of right femur, initial encounter for closed fracture: Secondary | ICD-10-CM

## 2024-01-02 LAB — COMPREHENSIVE METABOLIC PANEL WITH GFR
ALT: 40 U/L (ref 0–44)
AST: 39 U/L (ref 15–41)
Albumin: 3.7 g/dL (ref 3.5–5.0)
Alkaline Phosphatase: 76 U/L (ref 38–126)
Anion gap: 7 (ref 5–15)
BUN: 11 mg/dL (ref 8–23)
CO2: 29 mmol/L (ref 22–32)
Calcium: 8.9 mg/dL (ref 8.9–10.3)
Chloride: 101 mmol/L (ref 98–111)
Creatinine, Ser: 0.64 mg/dL (ref 0.61–1.24)
GFR, Estimated: >60 mL/min (ref >60)
Glucose, Bld: 112 mg/dL — ABNORMAL HIGH (ref 70–99)
Potassium: 4.9 mmol/L (ref 3.5–5.1)
Sodium: 136 mmol/L (ref 135–145)
Total Bilirubin: 0.8 mg/dL (ref 0.0–1.2)
Total Protein: 6.3 g/dL — ABNORMAL LOW (ref 6.5–8.1)

## 2024-01-02 LAB — CBC
HCT: 41.3 % (ref 39.0–52.0)
Hemoglobin: 13.5 g/dL (ref 13.0–17.0)
MCH: 35.3 pg — ABNORMAL HIGH (ref 26.0–34.0)
MCHC: 32.7 g/dL (ref 30.0–36.0)
MCV: 108.1 fL — ABNORMAL HIGH (ref 80.0–100.0)
Platelets: 106 K/uL — ABNORMAL LOW (ref 150–400)
RBC: 3.82 MIL/uL — ABNORMAL LOW (ref 4.22–5.81)
RDW: 12.5 % (ref 11.5–15.5)
WBC: 8.9 K/uL (ref 4.0–10.5)
nRBC: 0 % (ref 0.0–0.2)

## 2024-01-02 NOTE — Progress Notes (Signed)
 PROGRESS NOTE  Steven Blair  FMW:[id number] DOB: April 21, 1945 DOA: 12/31/2023 PCP: Steven Blair LABOR, MD   Brief Narrative: Patient is a 78 year old male with history of permanent atrial fibrillation, DVT, hypertension, peripheral vascular disease, ulcerative colitis who fell on the wet floor of the locker room at swimming pool .  He fell on his left hip and immediately developed pain.  No head injury.  Imaging showed periprosthetic left femoral neck fracture.  He has a history of femur fracture s/p repair and revision by Dr. Ernie.  Patient was unable to bear weight on presentation.  Orthopedics consulted here.  Plan for nonoperative management.  PT/OT consulted  01/02/2024: Patient seen alongside patient's Steven Blair.  No new complaints.  Awaiting discharge to subacute rehab (SNF).  Assessment & Plan:  Principal Problem:   Hip fracture (HCC) Active Problems:   History of DVT of lower extremity   Sleep apnea   Hypertension   Atrial fibrillation (HCC)   Obese   Acute left periprosthetic fracture of the left femur:  -Presented with mechanical fall.  Continue pain management, supportive care.  Orthopedics not planning for surgery.  Plan for nonoperative management.  Orthopedics recommended no active abduction.  PT/OT consulted.  Continue bowel regimen 01/02/2024: Pain is controlled.  Pursue disposition (SNF).  Permanent A-fib:  -Heart rate controlled.  Follows with cardiology.  On Cardizem  for rate control at home.  Takes Pradaxa  for anticoagulation  History of DVT: On Pradaxa .    Hyperlipidemia:On crestor   History of ulcerative colitis: Takes mesalamine .  Currently not on exacerbation  Hypertension: Currently blood pressure stable.  OSA: Continue CPAP at night.  Obesity: BMI 31.7        DVT prophylaxis:SCDs Start: 12/31/23 2059 dabigatran  (PRADAXA ) capsule 150 mg     Code Status: Full Code  Family Communication: Steven Blair at the bedside  Patient status:  Inpatient  Patient is from : Home  Anticipated discharge to: Home versus SNF  Estimated DC date: 1 to 2 days   Consultants: Orthopedic  Procedures: None  Antimicrobials:  Anti-infectives (From admission, onward)    None       Subjective: Patient seen  No new complaints.   Pain is controlled.    Objective: Vitals:   01/01/24 2105 01/02/24 0149 01/02/24 0500 01/02/24 1326  BP: 117/68 114/61 113/73 (!) 109/56  Pulse: 87 85 92 89  Resp: 19 20 20 16   Temp: 98.1 F (36.7 C) 98.4 F (36.9 C) 97.6 F (36.4 C) 98.9 F (37.2 C)  TempSrc: Oral Oral Oral Oral  SpO2: 96% 96% 94% 97%  Weight:      Height:        Intake/Output Summary (Last 24 hours) at 01/02/2024 1738 Last data filed at 01/02/2024 1001 Gross per 24 hour  Intake 720 ml  Output 750 ml  Net -30 ml   Filed Weights   12/31/23 1533  Weight: 106.1 kg    Examination:  General exam: Overall comfortable, not in distress. Respiratory system: Clear to auscultation. Cardiovascular system: S1-S2. Gastrointestinal system: Abdomen is obese, soft and nontender. Central nervous system: Alert and oriented   Data Reviewed: I have personally reviewed following labs and imaging studies  CBC: Recent Labs  Lab 12/31/23 1743 01/01/24 0357 01/02/24 0322  WBC 9.3 7.7 8.9  NEUTROABS 7.6  --   --   HGB 14.9 14.0 13.5  HCT 44.6 42.3 41.3  MCV 105.7* 106.5* 108.1*  PLT 138* 121* 106*   Basic Metabolic Panel: Recent Labs  Lab  12/31/23 1743 01/01/24 0357 01/02/24 0322  NA 139 139 136  K 4.4 4.7 4.9  CL 101 104 101  CO2 26 28 29   GLUCOSE 119* 115* 112*  BUN 27* 18 11  CREATININE 0.88 0.74 0.64  CALCIUM  9.7 8.7* 8.9     No results found for this or any previous visit (from the past 240 hours).   Radiology Studies: No results found.   Scheduled Meds:  dabigatran   150 mg Oral BID   diltiazem   120 mg Oral Daily   mesalamine   2.4 g Oral Daily   polyethylene glycol  17 g Oral Daily    senna-docusate  1 tablet Oral BID   Continuous Infusions:     LOS: 2 days   Steven LILLETTE Chapel, MD [facility] P12/12/2023, 5:38 PM

## 2024-01-02 NOTE — Progress Notes (Signed)
 Physical Therapy Treatment Patient Details Name: HORACIO WERTH MRN: [id number] DOB: 21-Jul-1945 Today's Date: 01/02/2024   History of Present Illness DONNAVAN COVAULT is a 78 y.o. male  who was swimming today after missing he slipped on a wet floor and fell on his left hip.  He has had bilateral hip replacement and previous surgeries before.  He has rod and screws there.  He immediately felt severe pain on the left side.  Did not hit his head. Ortho note:  Acute mildly displaced periprosthetic fracture involving the trochanteric region of the left femur. Based on the stability of his femoral component he can be weightbearing as tolerated through his left lower extremity.  Physical therapy should work with him on mobility restrictions including no active abduction. PMH: B THA, atrial fibrillation, history of DVT, dysrhythmia, previous fracture RIGHT femur, essential hypertension, peripheral vascular disease, ulcerative colitis    PT Comments  Pt continues very cooperative but requiring significant assist of 2 for performance of all basic mobility tasks and tolerating min WB on L LE.  Patient will benefit from continued inpatient follow up therapy, <3 hours/day     If plan is discharge home, recommend the following: Two people to help with walking and/or transfers;A lot of help with bathing/dressing/bathroom;Assistance with cooking/housework;Assist for transportation;Help with stairs or ramp for entrance   Can travel by private vehicle     No  Equipment Recommendations  Rolling walker (2 wheels)    Recommendations for Other Services       Precautions / Restrictions Precautions Precautions: Fall Recall of Precautions/Restrictions: Intact Precaution/Restrictions Comments: NO ACTIVE ABD L LE Restrictions Weight Bearing Restrictions Per Provider Order: Yes Other Position/Activity Restrictions: WBAT     Mobility  Bed Mobility Overal bed mobility: Needs Assistance Bed Mobility: Sit to  Supine     Supine to sit: +2 for physical assistance, +2 for safety/equipment, Mod assist, Max assist, HOB elevated, Used rails Sit to supine: Mod assist, Max assist, +2 for physical assistance, +2 for safety/equipment   General bed mobility comments: increased time, assist for LE management and to scoot and propel trunk over hips to sit with increased effort and assist with use of bed pad and features, cuing step by step    Transfers Overall transfer level: Needs assistance Equipment used: Rolling walker (2 wheels) Transfers: Sit to/from Stand, Bed to chair/wheelchair/BSC Sit to Stand: +2 physical assistance, +2 safety/equipment, Min assist, Mod assist, Max assist Stand pivot transfers: Mod assist, +2 physical assistance, +2 safety/equipment, From elevated surface         General transfer comment: heavy reliance thru UE's and R LE, step by step cues to pivot around to recliner    Ambulation/Gait Ambulation/Gait assistance: Mod assist, +2 physical assistance, +2 safety/equipment Gait Distance (Feet): 2 Feet Assistive device: Rolling walker (2 wheels) Gait Pattern/deviations: Step-to pattern, Decreased step length - right, Decreased step length - left, Shuffle, Trunk flexed Gait velocity: decr     General Gait Details: cues for sequence, posture, position from RW and increased UE WB with noted buckling at L LE   Stairs             Wheelchair Mobility     Tilt Bed    Modified Rankin (Stroke Patients Only)       Balance Overall balance assessment: Needs assistance Sitting-balance support: Feet supported Sitting balance-Leahy Scale: Fair     Standing balance support: Bilateral upper extremity supported, Reliant on assistive device for balance Standing balance-Leahy Scale: Poor  Communication Communication Communication: No apparent difficulties  Cognition Arousal: Alert Behavior During Therapy: WFL for tasks  assessed/performed   PT - Cognitive impairments: No apparent impairments                         Following commands: Intact      Cueing Cueing Techniques: Verbal cues  Exercises      General Comments        Pertinent Vitals/Pain Pain Assessment Pain Assessment: 0-10 Pain Score: 4  Pain Location: L hip Pain Descriptors / Indicators: Aching, Guarding Pain Intervention(s): Limited activity within patient's tolerance, Monitored during session, Ice applied    Home Living                          Prior Function            PT Goals (current goals can now be found in the care plan section) Acute Rehab PT Goals Patient Stated Goal: REgain IND PT Goal Formulation: With patient Time For Goal Achievement: 01/16/24 Potential to Achieve Goals: Good Progress towards PT goals: Progressing toward goals    Frequency    Min 5X/week      PT Plan      Co-evaluation PT/OT/SLP Co-Evaluation/Treatment: Yes Reason for Co-Treatment: For patient/therapist safety PT goals addressed during session: Mobility/safety with mobility;Balance;Proper use of DME OT goals addressed during session: ADL's and self-care;Proper use of Adaptive equipment and DME      AM-PAC PT 6 Clicks Mobility   Outcome Measure  Help needed turning from your back to your side while in a flat bed without using bedrails?: A Lot Help needed moving from lying on your back to sitting on the side of a flat bed without using bedrails?: A Lot Help needed moving to and from a bed to a chair (including a wheelchair)?: A Lot Help needed standing up from a chair using your arms (e.g., wheelchair or bedside chair)?: A Lot Help needed to walk in hospital room?: Total Help needed climbing 3-5 steps with a railing? : Total 6 Click Score: 10    End of Session Equipment Utilized During Treatment: Gait belt Activity Tolerance: Patient tolerated treatment well Patient left: in chair;with call bell/phone  within reach;with chair alarm set Nurse Communication: Mobility status PT Visit Diagnosis: Difficulty in walking, not elsewhere classified (R26.2)     Time: 8483-8464 PT Time Calculation (min) (ACUTE ONLY): 19 min  Charges:    $Therapeutic Activity: 8-22 mins PT General Charges $$ ACUTE PT VISIT: 1 Visit                     [facility] PT Acute Rehabilitation Services Office [phone]    Zade Falkner 01/02/2024, 4:05 PM

## 2024-01-02 NOTE — NC FL2 (Signed)
 [place]  MEDICAID FL2 LEVEL OF CARE FORM     IDENTIFICATION  Patient Name: GRYPHON VANDERVEEN Birthdate: 04-Sep-1945 Sex: male Admission Date (Current Location): 12/31/2023  [facility] and [place] Number:  Producer, Television/film/video and Address:  [facility],  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   Avulsion fracture of condyle of femur (HCC) 03/02/2019   Closed avulsion fracture of lesser trochanter of femur, right, initial encounter (HCC) 03/01/2019   Hematuria 03/01/2019   Nephrolithiasis 03/01/2019   Cholelithiasis 03/01/2019   Secondary hypercoagulable state 12/23/2018   Atrial fibrillation (HCC) 10/14/2018   Inability to walk 10/19/2017   Hip pain 10/19/2017   Left hip pain 10/19/2017   Protein-calorie malnutrition, severe 11/01/2014   Lower GI bleed 10/31/2014   Colitis 10/31/2014   History of DVT of lower extremity 10/31/2014   Sleep apnea    Hypertension     Orientation RESPIRATION BLADDER Height & Weight     Self, Time, Situation, Place  Normal Continent Weight: 234 lb (106.1 kg) Height:  6' (182.9 cm)  BEHAVIORAL SYMPTOMS/MOOD NEUROLOGICAL BOWEL NUTRITION STATUS      Continent Diet (regular)  AMBULATORY STATUS COMMUNICATION OF NEEDS Skin   Limited Assist Verbally Other (Comment) (surgical incision only)                       Personal Care Assistance Level of Assistance  Bathing, Dressing Bathing Assistance: Limited assistance    Dressing Assistance: Limited assistance     Functional Limitations Info  Sight, Hearing, Speech Sight Info: Adequate Hearing Info: Adequate Speech Info: Adequate    SPECIAL CARE FACTORS FREQUENCY  PT (By licensed PT), OT (By licensed OT)     PT Frequency: 5x/wk OT Frequency: 5x/wk            Contractures Contractures Info: Not present    Additional Factors Info  Code Status, Allergies Code Status Info: Full Allergies Info: sulfa antibiotics           Current Medications (01/02/2024):  This is the current hospital active medication list Current Facility-Administered Medications  Medication Dose Route Frequency Provider Last Rate Last Admin   dabigatran  (PRADAXA ) capsule 150 mg  150 mg Oral BID Jillian Buttery, MD   150 mg at 01/02/24 0916   diltiazem  (CARDIZEM  CD) 24 hr capsule 120 mg  120 mg Oral Daily Garba, Mohammad L, MD   120 mg at 01/02/24 0915   HYDROmorphone  (DILAUDID ) injection 0.5 mg  0.5 mg Intravenous Q2H PRN Garba, Mohammad L, MD   0.5 mg at 01/02/24 1312   mesalamine  (LIALDA ) EC tablet 2.4 g  2.4 g Oral Daily Sim Re L, MD   2.4 g at 01/02/24 9083   ondansetron  (ZOFRAN ) tablet 4 mg  4 mg Oral Q6H PRN Sim Re CROME, MD       Or   ondansetron  (ZOFRAN ) injection 4 mg  4 mg Intravenous Q6H PRN Sim Re  L, MD       oxyCODONE  (Oxy IR/ROXICODONE ) immediate release tablet 5 mg  5 mg Oral Q6H PRN Adhikari, Amrit, MD   5 mg at 01/02/24 0915   polyethylene glycol (MIRALAX  / GLYCOLAX ) packet 17 g  17 g Oral Daily Adhikari, Amrit, MD       senna-docusate (Senokot-S) tablet 1 tablet  1 tablet Oral BID Jillian Buttery, MD         Discharge Medications: Please see discharge summary for a list of discharge medications.  Relevant Imaging Results:  Relevant Lab Results:   Additional Information SS#[id number]  Doyle Tegethoff, LCSW

## 2024-01-02 NOTE — TOC Initial Note (Signed)
 Transition of Care [facility]) - Initial/Assessment Note    Patient Details  Name: Steven Blair MRN: [id number] Date of Birth: 1945-07-20  [facility]) CM/SW Contact:    Steven ASPEN, LCSW Phone Number: 01/02/2024, 2:24 PM  Clinical Narrative:                  Met with pt today to review PT recommendations for SNF rehab.  Pt aware and in agreement with plan.  Notes good support from wife but feels short term rehab will be needed.  Pt requests [facility] if possible, unfortunately, facility has just confirmed they are out of network with his primary insurance.  Will widen bed search.    Expected Discharge Plan: Skilled Nursing Facility Barriers to Discharge: Insurance Authorization, SNF Pending bed offer   Patient Goals and CMS Choice Patient states their goals for this hospitalization and ongoing recovery are:: short term rehab then home          Expected Discharge Plan and Services In-house Referral: Clinical Social Work   Post Acute Care Choice: Skilled Nursing Facility Living arrangements for the past 2 months: Single Family Home                 DME Arranged: N/A DME Agency: NA                  Prior Living Arrangements/Services Living arrangements for the past 2 months: Single Family Home Lives with:: Spouse Patient language and need for interpreter reviewed:: Yes Do you feel safe going back to the place where you live?: Yes      Need for Family Participation in Patient Care: Yes (Comment) Care giver support system in place?: Yes (comment)   Criminal Activity/Legal Involvement Pertinent to Current Situation/Hospitalization: No - Comment as needed  Activities of Daily Living   ADL Screening (condition at time of admission) Independently performs ADLs?: Yes (appropriate for developmental age) Is the patient deaf or have difficulty hearing?: No Does the patient have difficulty seeing, even when wearing glasses/contacts?: No Does the patient have  difficulty concentrating, remembering, or making decisions?: No  Permission Sought/Granted Permission sought to share information with : Family Supports, Steven Blair granted to share information with : Yes, Verbal Permission Granted  Share Information with NAME: wife, Steven Blair @ [phone]  Permission granted to share info w AGENCY: SNFs        Emotional Assessment Appearance:: Appears stated age Attitude/Demeanor/Rapport: Gracious Affect (typically observed): Accepting Orientation: : Oriented to Self, Oriented to Place, Oriented to  Time, Oriented to Situation Alcohol / Substance Use: Not Applicable Psych Involvement: No (comment)  Admission diagnosis:  Hip fracture (HCC) [S72.009A] Patient Active Problem List   Diagnosis Date Noted   Hip fracture (HCC) 12/31/2023   Obese 08/20/2019   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/19/2019   Avulsion fracture of condyle of femur (HCC) 03/02/2019   Closed avulsion fracture of lesser trochanter of femur, right, initial encounter (HCC) 03/01/2019   Hematuria 03/01/2019   Nephrolithiasis 03/01/2019   Cholelithiasis 03/01/2019   Secondary hypercoagulable state 12/23/2018   Atrial fibrillation (HCC) 10/14/2018   Inability to walk 10/19/2017   Hip pain 10/19/2017   Left hip pain 10/19/2017   Protein-calorie malnutrition, severe 11/01/2014   Lower GI bleed 10/31/2014   Colitis 10/31/2014   History of DVT of lower extremity 10/31/2014   Sleep apnea    Hypertension    PCP:  Steven Dorn LABOR, MD Pharmacy:  [id number] [address] [place],  - [address] AT [facility] OF [address] [place] [address] Phone: [phone] Fax: [phone]  [facility] [address] [place] [address] Phone: [phone] Fax: [phone]     Social Drivers of Health (SDOH) Social History: SDOH  Screenings   Food Insecurity: No Food Insecurity (12/31/2023)  Housing: Low Risk (12/31/2023)  Transportation Needs: No Transportation Needs (12/31/2023)  Utilities: Not At Risk (12/31/2023)  Social Connections: Moderately Isolated (12/31/2023)  Tobacco Use: Low Risk (01/01/2024)   SDOH Interventions:     Readmission Risk Interventions    01/02/2024    2:13 PM  Readmission Risk Prevention Plan  Post Dischage Appt Complete  Medication Screening Complete  Transportation Screening Complete

## 2024-01-02 NOTE — Evaluation (Signed)
 Occupational Therapy Evaluation Patient Details Name: Steven Blair MRN: [id number] DOB: 10-28-1945 Today's Date: 01/02/2024   History of Present Illness   Steven Blair is a 78 y.o. male  who was swimming today after missing he slipped on a wet floor and fell on his left hip.  He has had bilateral hip replacement and previous surgeries before.  He has rod and screws there.  He immediately felt severe pain on the left side.  Did not hit his head. Ortho note:  Acute mildly displaced periprosthetic fracture involving the trochanteric region of the left femur. Based on the stability of his femoral component he can be weightbearing as tolerated through his left lower extremity.  Physical therapy should work with him on mobility restrictions including no active abduction. PMH: B THA, atrial fibrillation, history of DVT, dysrhythmia, previous fracture RIGHT femur, essential hypertension, peripheral vascular disease, ulcerative colitis     Clinical Impressions PTA, patient lives at home with wife and was completely independent including ambulating, driving, swimming and all B/IADL's.  Currently, patient presents with deficits outlined below (see OT Problem List for details) most significantly pain, baseline B shoulder ROM deficits, decreased functional reach to LB, activity tolerance and balance limiting BADL's (max A LB) and functional mobility (mod A +2 SPT) performance. Patient will benefit from continued inpatient follow up therapy, <3 hours/day. Patient requires continued Acute care hospital level OT services to progress safety and functional performance and allow for discharge.       If plan is discharge home, recommend the following:   Two people to help with walking and/or transfers;A lot of help with bathing/dressing/bathroom;Assist for transportation;Help with stairs or ramp for entrance     Functional Status Assessment   Patient has had a recent decline in their functional status  and demonstrates the ability to make significant improvements in function in a reasonable and predictable amount of time.     Equipment Recommendations   BSC/3in1      Precautions/Restrictions   Precautions Precautions: Fall Recall of Precautions/Restrictions: Intact Restrictions Weight Bearing Restrictions Per Provider Order: Yes Other Position/Activity Restrictions: WBAT     Mobility Bed Mobility Overal bed mobility: Needs Assistance Bed Mobility: Supine to Sit     Supine to sit: +2 for physical assistance, +2 for safety/equipment, Mod assist, Max assist, HOB elevated, Used rails     General bed mobility comments: increased time, assist for LE management and to scoot and propel trunk over hips to sit with increased effort and assist with use of bed pad and features, cuing step by step    Transfers Overall transfer level: Needs assistance Equipment used: Rolling walker (2 wheels) Transfers: Sit to/from Stand, Bed to chair/wheelchair/BSC Sit to Stand: Mod assist, +2 physical assistance, +2 safety/equipment, From elevated surface Stand pivot transfers: Mod assist, +2 physical assistance, +2 safety/equipment, From elevated surface         General transfer comment: heavy reliance thru UE's and R LE, step by step cues to pivot around to recliner      Balance Overall balance assessment: Needs assistance Sitting-balance support: Feet supported Sitting balance-Leahy Scale: Fair     Standing balance support: Bilateral upper extremity supported, Reliant on assistive device for balance Standing balance-Leahy Scale: Poor                             ADL either performed or assessed with clinical judgement   ADL Overall ADL's : Needs  assistance/impaired Eating/Feeding: Independent   Grooming: Wash/dry hands;Wash/dry face;Oral care;Set up;Sitting   Upper Body Bathing: Minimal assistance;Sitting   Lower Body Bathing: Maximal assistance;Sitting/lateral  leans   Upper Body Dressing : Minimal assistance;Sitting   Lower Body Dressing: Maximal assistance;Sitting/lateral leans   Toilet Transfer:  (has not attempted BSC but oob to recliner +2 mod A)   Toileting- Clothing Manipulation and Hygiene: Maximal assistance Toileting - Clothing Manipulation Details (indicate cue type and reason): used bed pan and Fcondom catheter currently for voiding     Functional mobility during ADLs: Moderate assistance;+2 for physical assistance;+2 for safety/equipment;Rolling walker (2 wheels) General ADL Comments: decreased functional reach to LE's     Vision Baseline Vision/History: 0 No visual deficits;1 Wears glasses Ability to See in Adequate Light: 0 Adequate Patient Visual Report: No change from baseline              Pertinent Vitals/Pain Pain Assessment Pain Assessment: 0-10 Pain Score: 3  Pain Location: L hip Pain Descriptors / Indicators: Aching, Guarding Pain Intervention(s): Limited activity within patient's tolerance, Monitored during session, Premedicated before session, Repositioned, Ice applied     Extremity/Trunk Assessment Upper Extremity Assessment Upper Extremity Assessment: Right hand dominant;LUE deficits/detail;RUE deficits/detail RUE Deficits / Details: hx of baseline RTC issues with AROM ~ 0-110 LUE Deficits / Details: hx of baseline RTC issues with AROM ~ 0-110   Lower Extremity Assessment Lower Extremity Assessment: Defer to PT evaluation   Cervical / Trunk Assessment Cervical / Trunk Assessment: Normal   Communication Communication Communication: No apparent difficulties   Cognition Arousal: Alert Behavior During Therapy: WFL for tasks assessed/performed Cognition: No apparent impairments                               Following commands: Intact       Cueing  General Comments   Cueing Techniques: Verbal cues  condom catheter in place for voiding, mild LE discoloration appearing baseline B  lower legs, cues for pacing and slow position changes but no report of lightheadedness and no SOB           Home Living Family/patient expects to be discharged to:: Private residence Living Arrangements: Spouse/significant other Available Help at Discharge: Family Type of Home: House Home Access: Stairs to enter Secretary/administrator of Steps: 1 Entrance Stairs-Rails: None Home Layout: Two level;Bed/bath upstairs Alternate Level Stairs-Number of Steps: 17 Alternate Level Stairs-Rails: Left Bathroom Shower/Tub: Producer, Television/film/video: Standard Bathroom Accessibility: Yes How Accessible: Accessible via walker Home Equipment: Cane - quad;Shower seat - built Dispensing Optician Comments: wife had recent back surgery and has a RW but unsure if height appropriate      Prior Functioning/Environment Prior Level of Function : Independent/Modified Independent                    OT Problem List: Decreased activity tolerance;Impaired balance (sitting and/or standing);Decreased range of motion;Cardiopulmonary status limiting activity;Impaired UE functional use;Pain   OT Treatment/Interventions: Self-care/ADL training;Therapeutic exercise;Neuromuscular education;Energy conservation;DME and/or AE instruction;Patient/family education;Balance training      OT Goals(Current goals can be found in the care plan section)   Acute Rehab OT Goals Patient Stated Goal: to go get some rehab a while OT Goal Formulation: With patient Time For Goal Achievement: 01/16/24 Potential to Achieve Goals: Good ADL Goals Pt Will Perform Lower Body Bathing: with min assist;sit to/from stand;with adaptive equipment Pt Will Perform Lower  Body Dressing: with min assist;with adaptive equipment;sit to/from stand Pt Will Transfer to Toilet: with min assist;stand pivot transfer;bedside commode Pt Will Perform Toileting - Clothing Manipulation and hygiene:  with min assist;sit to/from stand Pt Will Perform Tub/Shower Transfer: with mod assist;Shower transfer;tub bench;rolling walker   OT Frequency:  Min 2X/week    Co-evaluation   Reason for Co-Treatment: For patient/therapist safety PT goals addressed during session: Mobility/safety with mobility;Balance;Proper use of DME OT goals addressed during session: ADL's and self-care;Proper use of Adaptive equipment and DME      AM-PAC OT 6 Clicks Daily Activity     Outcome Measure Help from another person eating meals?: None Help from another person taking care of personal grooming?: A Little Help from another person toileting, which includes using toliet, bedpan, or urinal?: A Lot Help from another person bathing (including washing, rinsing, drying)?: A Lot Help from another person to put on and taking off regular upper body clothing?: A Little Help from another person to put on and taking off regular lower body clothing?: A Lot 6 Click Score: 16   End of Session Equipment Utilized During Treatment: Gait belt;Rolling walker (2 wheels) Nurse Communication: Mobility status;Weight bearing status  Activity Tolerance: Patient limited by pain Patient left: in chair;with call bell/phone within reach;with chair alarm set  OT Visit Diagnosis: Unsteadiness on feet (R26.81);History of falling (Z91.81);Pain Pain - Right/Left: Left Pain - part of body: Hip                Time: 1127-1150 OT Time Calculation (min): 23 min Charges:  OT General Charges $OT Visit: 1 Visit OT Evaluation $OT Eval Low Complexity: 1 Low  Amera Banos OT/L Acute Rehabilitation Department  [phone]  01/02/2024, 12:11 PM

## 2024-01-02 NOTE — Evaluation (Signed)
 Physical Therapy Evaluation Patient Details Name: Steven Blair MRN: [id number] DOB: [birth year] Today's Date: 01/02/2024  History of Present Illness  Steven Blair is a 78 y.o. male  who was swimming today after missing he slipped on a wet floor and fell on his left hip.  He has had bilateral hip replacement and previous surgeries before.  He has rod and screws there.  He immediately felt severe pain on the left side.  Did not hit his head. Ortho note:  Acute mildly displaced periprosthetic fracture involving the trochanteric region of the left femur. Based on the stability of his femoral component he can be weightbearing as tolerated through his left lower extremity.  Physical therapy should work with him on mobility restrictions including no active abduction. PMH: B THA, atrial fibrillation, history of DVT, dysrhythmia, previous fracture RIGHT femur, essential hypertension, peripheral vascular disease, ulcerative colitis  Clinical Impression  Pt admitted as above and presenting with functional mobility limitations 2* decreased L LE strength/ROM, no active ABD L LE, post op pain and decreased WB tolerance on L.  This date, pt requires significant assist of 2 for all basic mobility tasks and patient will benefit from continued inpatient follow up therapy, <3 hours/day.         If plan is discharge home, recommend the following: Two people to help with walking and/or transfers;A lot of help with bathing/dressing/bathroom;Assistance with cooking/housework;Assist for transportation;Help with stairs or ramp for entrance   Can travel by private vehicle   No    Equipment Recommendations Rolling walker (2 wheels)  Recommendations for Other Services       Functional Status Assessment Patient has had a recent decline in their functional status and demonstrates the ability to make significant improvements in function in a reasonable and predictable amount of time.     Precautions / Restrictions  Precautions Precautions: Fall Recall of Precautions/Restrictions: Intact Precaution/Restrictions Comments: NO ACTIVE ABD L LE Restrictions Weight Bearing Restrictions Per Provider Order: Yes Other Position/Activity Restrictions: WBAT      Mobility  Bed Mobility Overal bed mobility: Needs Assistance Bed Mobility: Supine to Sit     Supine to sit: +2 for physical assistance, +2 for safety/equipment, Mod assist, Max assist, HOB elevated, Used rails     General bed mobility comments: increased time, assist for LE management and to scoot and propel trunk over hips to sit with increased effort and assist with use of bed pad and features, cuing step by step    Transfers Overall transfer level: Needs assistance Equipment used: Rolling walker (2 wheels) Transfers: Sit to/from Stand, Bed to chair/wheelchair/BSC Sit to Stand: Mod assist, +2 physical assistance, +2 safety/equipment, From elevated surface Stand pivot transfers: Mod assist, +2 physical assistance, +2 safety/equipment, From elevated surface         General transfer comment: heavy reliance thru UE's and R LE, step by step cues to pivot around to recliner    Ambulation/Gait Ambulation/Gait assistance: Mod assist, +2 physical assistance, +2 safety/equipment Gait Distance (Feet): 2 Feet Assistive device: Rolling walker (2 wheels) Gait Pattern/deviations: Step-to pattern, Decreased step length - right, Decreased step length - left, Shuffle, Trunk flexed Gait velocity: decr     General Gait Details: cues for sequence, posture, position from RW and increased UE WB with noted buckling at L LE  Stairs            Wheelchair Mobility     Tilt Bed    Modified Rankin (Stroke Patients Only)  Balance Overall balance assessment: Needs assistance Sitting-balance support: Feet supported Sitting balance-Leahy Scale: Fair     Standing balance support: Bilateral upper extremity supported, Reliant on assistive  device for balance Standing balance-Leahy Scale: Poor                               Pertinent Vitals/Pain Pain Assessment Pain Assessment: 0-10 Pain Score: 3  Pain Location: L hip Pain Descriptors / Indicators: Aching, Guarding Pain Intervention(s): Limited activity within patient's tolerance, Monitored during session, Premedicated before session, Ice applied    Home Living Family/patient expects to be discharged to:: Skilled nursing facility Living Arrangements: Spouse/significant other Available Help at Discharge: Family Type of Home: House Home Access: Stairs to enter Entrance Stairs-Rails: None Entrance Stairs-Number of Steps: 1 Alternate Level Stairs-Number of Steps: 17 Home Layout: Two level;Bed/bath upstairs Home Equipment: Cane - quad;Shower seat - built Museum/gallery Curator Comments: wife had recent back surgery and has a RW but unsure if height appropriate    Prior Function Prior Level of Function : Independent/Modified Independent                     Extremity/Trunk Assessment   Upper Extremity Assessment Upper Extremity Assessment: Overall WFL for tasks assessed RUE Deficits / Details: hx of baseline RTC issues with AROM ~ 0-110 LUE Deficits / Details: hx of baseline RTC issues with AROM ~ 0-110    Lower Extremity Assessment Lower Extremity Assessment: LLE deficits/detail LLE: Unable to fully assess due to pain    Cervical / Trunk Assessment Cervical / Trunk Assessment: Normal  Communication   Communication Communication: No apparent difficulties    Cognition Arousal: Alert Behavior During Therapy: WFL for tasks assessed/performed   PT - Cognitive impairments: No apparent impairments                         Following commands: Intact       Cueing Cueing Techniques: Verbal cues     General Comments General comments (skin integrity, edema, etc.): condom catheter in place for voiding, mild LE  discoloration appearing baseline B lower legs, cues for pacing and slow position changes but no report of lightheadedness and no SOB    Exercises     Assessment/Plan    PT Assessment Patient needs continued PT services  PT Problem List Decreased strength;Decreased range of motion;Decreased activity tolerance;Decreased balance;Decreased mobility;Decreased knowledge of use of DME;Pain       PT Treatment Interventions DME instruction;Gait training;Stair training;Functional mobility training;Therapeutic activities;Therapeutic exercise;Balance training;Patient/family education    PT Goals (Current goals can be found in the Care Plan section)  Acute Rehab PT Goals Patient Stated Goal: REgain IND PT Goal Formulation: With patient Time For Goal Achievement: 01/16/24 Potential to Achieve Goals: Good    Frequency Min 5X/week     Co-evaluation PT/OT/SLP Co-Evaluation/Treatment: Yes Reason for Co-Treatment: For patient/therapist safety PT goals addressed during session: Mobility/safety with mobility;Balance;Proper use of DME OT goals addressed during session: ADL's and self-care;Proper use of Adaptive equipment and DME       AM-PAC PT 6 Clicks Mobility  Outcome Measure Help needed turning from your back to your side while in a flat bed without using bedrails?: A Lot Help needed moving from lying on your back to sitting on the side of a flat bed without using bedrails?: A Lot Help needed moving to and from a bed to a  chair (including a wheelchair)?: A Lot Help needed standing up from a chair using your arms (e.g., wheelchair or bedside chair)?: A Lot Help needed to walk in hospital room?: Total Help needed climbing 3-5 steps with a railing? : Total 6 Click Score: 10    End of Session Equipment Utilized During Treatment: Gait belt Activity Tolerance: Patient tolerated treatment well Patient left: in chair;with call bell/phone within reach;with chair alarm set Nurse Communication:  Mobility status PT Visit Diagnosis: Difficulty in walking, not elsewhere classified (R26.2)    Time: 1130-1153 PT Time Calculation (min) (ACUTE ONLY): 23 min   Charges:   PT Evaluation $PT Eval Low Complexity: 1 Low   PT General Charges $$ ACUTE PT VISIT: 1 Visit         [facility] PT Acute Rehabilitation Services Office [phone]   Scottlynn Lindell 01/02/2024, 12:58 PM

## 2024-01-02 NOTE — Consult Note (Addendum)
 Reason for Consult:left hip peri-prosthetic fracture Referring Physician: Marshia, MD(Hospitalist)  Steven Blair is an 78 y.o. male.  HPI:   Steven Blair is a 78 y.o. male with medical history significant of atrial fibrillation, history of DVT, dysrhythmia, previous fracture RIGHT femur, essential hypertension, peripheral vascular disease, ulcerative colitis, who was apparently swimming today after missing he slipped on a wet floor and fell on his left hip.  He has had bilateral hip replacement and previous surgeries before.  He has rod and screws there.  He immediately felt severe pain on the left side.  Did not hit his head.  Was brought in where he was seen evaluated and appears to have periprosthetic left greater trochanteric femoral fracture.  Patient was transferred to here for possible surgical repair.  Previous surgeries were performed by Dr. Amy.   Due to this recent injury he has significant pain in his left hip limiting his functional capability at this point.  Patient well known to me, recently seen for exacerbation of low back pain related to lumbar DDD.  Routinely followed for his prior arthroplasties.  He has a history of a left total hip replacement and subsequent revision complicated by periprosthetic fracture requiring open reduction internal fixation with stable hardware at his last radiographic evaluation in the office recently.   Past Surgical History:  Procedure Laterality Date   CARDIOVERSION     tx Paroxysmal Atrial Fibrillation- converted to NSR   CARDIOVERSION N/A 11/06/2018   Procedure: CARDIOVERSION;  Surgeon: Kate Lonni CROME, MD;  Location: [facility] ENDOSCOPY;  Service: Endoscopy;  Laterality: N/A;   COLONOSCOPY W/ POLYPECTOMY     EYE SURGERY Right 2016   FEMUR SURGERY Left    ORIF 10'05   FLEXIBLE SIGMOIDOSCOPY N/A 11/01/2014   Procedure: FLEXIBLE SIGMOIDOSCOPY;  Surgeon: Gladis MARLA Louder, MD;  Location: [facility] ENDOSCOPY;  Service: Endoscopy;   Laterality: N/A;   JOINT REPLACEMENT     BTHA   TOTAL HIP REVISION Right 08/19/2019   Procedure: TOTAL HIP REVISION;  Surgeon: Ernie Cough, MD;  Location: [facility] ORS;  Service: Orthopedics;  Laterality: Right;  2 hrs    Family History  Problem Relation Age of Onset   Lung cancer Brother     Social History:  reports that he has never smoked. He has never used smokeless tobacco. He reports current alcohol use of about 2.0 standard drinks of alcohol per week. He reports that he does not use drugs.  Allergies: Allergies[1]  Medications: I have reviewed the patient's current medications. Scheduled:  dabigatran   150 mg Oral BID   diltiazem   120 mg Oral Daily   mesalamine   2.4 g Oral Daily   polyethylene glycol  17 g Oral Daily   senna-docusate  1 tablet Oral BID    Results for orders placed or performed during the hospital encounter of 12/31/23 (from the past 24 hours)  Comprehensive metabolic panel with GFR     Status: Abnormal   Collection Time: 01/02/24  3:22 AM  Result Value Ref Range   Sodium 136 135 - 145 mmol/L   Potassium 4.9 3.5 - 5.1 mmol/L   Chloride 101 98 - 111 mmol/L   CO2 29 22 - 32 mmol/L   Glucose, Bld 112 (H) 70 - 99 mg/dL   BUN 11 8 - 23 mg/dL   Creatinine, Ser 9.35 0.61 - 1.24 mg/dL   Calcium 8.9 8.9 - 89.6 mg/dL   Total Protein 6.3 (L) 6.5 - 8.1 g/dL   Albumin 3.7  3.5 - 5.0 g/dL   AST 39 15 - 41 U/L   ALT 40 0 - 44 U/L   Alkaline Phosphatase 76 38 - 126 U/L   Total Bilirubin 0.8 0.0 - 1.2 mg/dL   GFR, Estimated >39 >39 mL/min   Anion gap 7 5 - 15  CBC     Status: Abnormal   Collection Time: 01/02/24  3:22 AM  Result Value Ref Range   WBC 8.9 4.0 - 10.5 K/uL   RBC 3.82 (L) 4.22 - 5.81 MIL/uL   Hemoglobin 13.5 13.0 - 17.0 g/dL   HCT 58.6 60.9 - 47.9 %   MCV 108.1 (H) 80.0 - 100.0 fL   MCH 35.3 (H) 26.0 - 34.0 pg   MCHC 32.7 30.0 - 36.0 g/dL   RDW 87.4 88.4 - 84.4 %   Platelets 106 (L) 150 - 400 K/uL   nRBC 0.0 0.0 - 0.2 %     X-ray: CLINICAL  DATA:  Fall   EXAM: DG HIP (WITH OR WITHOUT PELVIS) 2-3V LEFT   COMPARISON:  03/01/2019   FINDINGS: SI joints are non widened. Pubic symphysis and rami appear intact. Bilateral hip replacements with normal alignment. Surgical plate, screw and cerclage wire fixation of the femoral shaft across old fracture deformity. Interval acute mildly displaced periprosthetic fracture involving the trochanteric region of the left femur. Vascular calcifications.   IMPRESSION: Acute mildly displaced periprosthetic fracture involving the trochanteric region of the left femur.     Electronically Signed   By: Luke Bun M.D.  ** Review of these new radiographs in comparison to his most recent radiographs in the office from the past month show stable femoral component without displacement or subsidence consistent with a stable femoral component despite the fracture involving his greater trochanter.  ROS: As noted in his history and physical  Blood pressure 113/73, pulse 92, temperature 97.6 F (36.4 C), temperature source Oral, resp. rate 20, height 6' (1.829 m), weight 106.1 kg, SpO2 94%.  Physical Exam: Constitutional: Stable, NAD, calm, comfortable Eyes: PERRL, lids and conjunctivae normal ENMT: Mucous membranes are moist. Posterior pharynx clear of any exudate or lesions.Normal dentition.  Neck: normal, supple, no masses, no thyromegaly Respiratory: clear to auscultation bilaterally, no wheezing, no crackles. Normal respiratory effort. No accessory muscle use.  Cardiovascular: Sinus tachycardia, no murmurs / rubs / gallops. No extremity edema. 2+ pedal pulses. No carotid bruits.  Abdomen: no tenderness, no masses palpated. No hepatosplenomegaly. Bowel sounds positive.  Musculoskeletal: Decreased range of motion, tender over his greater trochanter laterally without significant deformity.  Pain with any attempted range of motion actively and passively at this point  Skin: no rashes, lesions,  ulcers. No induration Neurologic: CN 2-12 grossly intact. Sensation intact, DTR normal. Strength 5/5 in all 4.  Psychiatric: Normal judgment and insight. Alert and oriented x 3. Normal mood  Assessment/Plan: 1.  History of complex surgical history involving his left hip and femur now with a minimally displaced fracture involving his greater trochanter with stable femoral component without displacement or subsidence  Plan: Today we reviewed his presentation and injury.  We reviewed the radiographic findings involving his greater trochanter.  Historical data regarding fractures involving the greater trochanter have mixed results with regards to surgical intervention healing or the lack thereof as well as hardware complications.  Based on this current recommendations are for nonoperative management.  Based on the stability of his femoral component he can be weightbearing as tolerated through his left lower extremity.  Physical  therapy should work with him on mobility restrictions including no active abduction.  Mobility in the bed can be as tolerated either to his right or left. At this point disposition will be based on therapy assessment and his needs at home.  He does have stairs to navigate which may limit his ability to go home at this point.  He may require at this point short-term nursing facility to work on mobility while his fracture heals and stabilizes prior to discharge to home. I recommend physical therapy assessment on his needs. I recommended TOC consult for assistance in disposition in the short-term including the possibility of a nursing facility.  He does report that he did state collapse before and would be interested in considering that if possible. We will follow-up with him in 3 to 4 weeks for clinical and radiographic follow-up. He is currently on Pradaxa  thus no other DVT prophylaxis required Pain control with oxycodone  at discharge.  Donnice JONETTA Car 01/02/2024, 7:26 AM          [1]  Allergies Allergen Reactions   Sulfa Antibiotics Other (See Comments)    Childhood allergy

## 2024-01-03 DIAGNOSIS — S72002D Fracture of unspecified part of neck of left femur, subsequent encounter for closed fracture with routine healing: Secondary | ICD-10-CM

## 2024-01-03 NOTE — Plan of Care (Signed)

## 2024-01-03 NOTE — Plan of Care (Signed)
  Problem: Education: Goal: Knowledge of General Education information will improve Description: Including pain rating scale, medication(s)/side effects and non-pharmacologic comfort measures Outcome: Progressing   Problem: Health Behavior/Discharge Planning: Goal: Ability to manage health-related needs will improve Outcome: Progressing   Problem: Clinical Measurements: Goal: Ability to maintain clinical measurements within normal limits will improve Outcome: Progressing Goal: Will remain free from infection Outcome: Progressing Goal: Diagnostic test results will improve Outcome: Progressing Goal: Respiratory complications will improve Outcome: Progressing Goal: Cardiovascular complication will be avoided Outcome: Progressing   Problem: Activity: Goal: Risk for activity intolerance will decrease Outcome: Progressing   Problem: Nutrition: Goal: Adequate nutrition will be maintained Outcome: Completed/Met   Problem: Coping: Goal: Level of anxiety will decrease Outcome: Progressing   Problem: Elimination: Goal: Will not experience complications related to bowel motility Outcome: Progressing Goal: Will not experience complications related to urinary retention Outcome: Completed/Met   Problem: Pain Managment: Goal: General experience of comfort will improve and/or be controlled Outcome: Progressing   Problem: Safety: Goal: Ability to remain free from injury will improve Outcome: Progressing   Problem: Skin Integrity: Goal: Risk for impaired skin integrity will decrease Outcome: Progressing

## 2024-01-03 NOTE — Progress Notes (Signed)
 Physical Therapy Treatment Patient Details Name: Steven Blair MRN: [id number] DOB: 04/23/1945 Today's Date: 01/03/2024   History of Present Illness Steven Blair is a 78 y.o. male  who was swimming today after missing he slipped on a wet floor and fell on his left hip.  He has had bilateral hip replacement and previous surgeries before.  He has rod and screws there.  He immediately felt severe pain on the left side.  Did not hit his head. Ortho note:  Acute mildly displaced periprosthetic fracture involving the trochanteric region of the left femur. Based on the stability of his femoral component he can be weightbearing as tolerated through his left lower extremity.  Physical therapy should work with him on mobility restrictions including no active abduction. PMH: B THA, atrial fibrillation, history of DVT, dysrhythmia, previous fracture RIGHT femur, essential hypertension, peripheral vascular disease, ulcerative colitis    PT Comments  Pt continues very motivated but progressing slowly with mobility and requiring step-by-step cues and significant assist of 2 for performance of all basic mobility tasks.  Patient will benefit from continued inpatient follow up therapy, <3 hours/day    If plan is discharge home, recommend the following: Two people to help with walking and/or transfers;A lot of help with bathing/dressing/bathroom;Assistance with cooking/housework;Assist for transportation;Help with stairs or ramp for entrance   Can travel by private vehicle     No  Equipment Recommendations  Rolling walker (2 wheels)    Recommendations for Other Services       Precautions / Restrictions Precautions Precautions: Fall Recall of Precautions/Restrictions: Intact Precaution/Restrictions Comments: NO ACTIVE ABD L LE Restrictions Weight Bearing Restrictions Per Provider Order: Yes LLE Weight Bearing Per Provider Order: Weight bearing as tolerated Other Position/Activity Restrictions: WBAT      Mobility  Bed Mobility Overal bed mobility: Needs Assistance Bed Mobility: Supine to Sit     Supine to sit: +2 for physical assistance, +2 for safety/equipment, Mod assist, HOB elevated, Used rails     General bed mobility comments: Pt in chair at session beginning and BSC at end    Transfers Overall transfer level: Needs assistance Equipment used: Rolling walker (2 wheels) Transfers: Sit to/from Stand, Bed to chair/wheelchair/BSC Sit to Stand: +2 physical assistance, +2 safety/equipment, Mod assist Stand pivot transfers: Mod assist, +2 physical assistance, +2 safety/equipment, From elevated surface         General transfer comment: heavy reliance thru UE's and R LE, step by step cues for Step pvt bed to [facility] to recliner    Ambulation/Gait Ambulation/Gait assistance: Mod assist, +2 physical assistance, +2 safety/equipment Gait Distance (Feet): 2 Feet Assistive device: Rolling walker (2 wheels) Gait Pattern/deviations: Step-to pattern, Decreased step length - right, Decreased step length - left, Shuffle, Trunk flexed Gait velocity: decr     General Gait Details: cues for sequence, posture, position from RW and increased UE WB with noted buckling at L LE   Stairs             Wheelchair Mobility     Tilt Bed    Modified Rankin (Stroke Patients Only)       Balance Overall balance assessment: Needs assistance Sitting-balance support: Feet supported Sitting balance-Leahy Scale: Good     Standing balance support: Bilateral upper extremity supported, Reliant on assistive device for balance Standing balance-Leahy Scale: Poor  Communication Communication Communication: No apparent difficulties  Cognition Arousal: Alert Behavior During Therapy: WFL for tasks assessed/performed   PT - Cognitive impairments: No apparent impairments                         Following commands: Intact      Cueing Cueing  Techniques: Verbal cues  Exercises      General Comments        Pertinent Vitals/Pain Pain Assessment Pain Assessment: 0-10 Pain Score: 4  Pain Location: L hip Pain Descriptors / Indicators: Aching, Guarding Pain Intervention(s): Limited activity within patient's tolerance, Monitored during session, Premedicated before session    Home Living                          Prior Function            PT Goals (current goals can now be found in the care plan section) Acute Rehab PT Goals Patient Stated Goal: REgain IND PT Goal Formulation: With patient Time For Goal Achievement: 01/16/24 Potential to Achieve Goals: Good Progress towards PT goals: Progressing toward goals    Frequency    Min 5X/week      PT Plan      Co-evaluation              AM-PAC PT 6 Clicks Mobility   Outcome Measure  Help needed turning from your back to your side while in a flat bed without using bedrails?: A Lot Help needed moving from lying on your back to sitting on the side of a flat bed without using bedrails?: A Lot Help needed moving to and from a bed to a chair (including a wheelchair)?: A Lot Help needed standing up from a chair using your arms (e.g., wheelchair or bedside chair)?: A Lot Help needed to walk in hospital room?: Total Help needed climbing 3-5 steps with a railing? : Total 6 Click Score: 10    End of Session Equipment Utilized During Treatment: Gait belt Activity Tolerance: Patient tolerated treatment well Patient left: Other (comment) (BSC) Nurse Communication: Mobility status PT Visit Diagnosis: Difficulty in walking, not elsewhere classified (R26.2)     Time: 8663-8652 PT Time Calculation (min) (ACUTE ONLY): 11 min  Charges:    $Therapeutic Activity: 8-22 mins PT General Charges $$ ACUTE PT VISIT: 1 Visit                     [facility] PT Acute Rehabilitation Services Office [phone]    Ardean Melroy 01/03/2024, 3:22 PM

## 2024-01-03 NOTE — Progress Notes (Signed)
 Physical Therapy Treatment Patient Details Name: Steven Blair MRN: [id number] DOB: 09/15/1945 Today's Date: 01/03/2024   History of Present Illness Steven Blair is a 78 y.o. male  who was swimming today after missing he slipped on a wet floor and fell on his left hip.  He has had bilateral hip replacement and previous surgeries before.  He has rod and screws there.  He immediately felt severe pain on the left side.  Did not hit his head. Ortho note:  Acute mildly displaced periprosthetic fracture involving the trochanteric region of the left femur. Based on the stability of his femoral component he can be weightbearing as tolerated through his left lower extremity.  Physical therapy should work with him on mobility restrictions including no active abduction. PMH: B THA, atrial fibrillation, history of DVT, dysrhythmia, previous fracture RIGHT femur, essential hypertension, peripheral vascular disease, ulcerative colitis    PT Comments   Pt continues very motivated but progressing slowly with mobility and requiring step-by-step cues and significant assist of 2 for performance of all basic mobility tasks.  Patient will benefit from continued inpatient follow up therapy, <3 hours/day.  This pm, pt attempted multiple times to stand from [facility] to RW but unable to attain standing - transfer completed with use of Stedy and mod assist of two.  Pt returned to bed and positioned for comfort.  RN aware.   If plan is discharge home, recommend the following: Two people to help with walking and/or transfers;A lot of help with bathing/dressing/bathroom;Assistance with cooking/housework;Assist for transportation;Help with stairs or ramp for entrance   Can travel by private vehicle     No  Equipment Recommendations  Rolling walker (2 wheels)    Recommendations for Other Services       Precautions / Restrictions Precautions Precautions: Fall Recall of Precautions/Restrictions:  Intact Precaution/Restrictions Comments: NO ACTIVE ABD L LE Restrictions Weight Bearing Restrictions Per Provider Order: Yes LLE Weight Bearing Per Provider Order: Weight bearing as tolerated Other Position/Activity Restrictions: WBAT     Mobility  Bed Mobility Overal bed mobility: Needs Assistance Bed Mobility: Sit to Supine     Supine to sit: +2 for physical assistance, +2 for safety/equipment, Mod assist, HOB elevated, Used rails Sit to supine: Mod assist, +2 for physical assistance, +2 for safety/equipment, Used rails   General bed mobility comments: increased time, assist for LE management and to control trunk ([email or web address])    Transfers Overall transfer level: Needs assistance Equipment used: Rolling walker (2 wheels) (and Stedy) Transfers: Sit to/from Stand, Bed to chair/wheelchair/BSC Sit to Stand: +2 physical assistance, +2 safety/equipment, Max assist Stand pivot transfers: Mod assist, +2 physical assistance, +2 safety/equipment, From elevated surface         General transfer comment: Sit to stand from [facility] attempted x 4 but pt unable to attain standing.  Transfer completed with [facility]    Ambulation/Gait Ambulation/Gait assistance: Mod assist, +2 physical assistance, +2 safety/equipment Gait Distance (Feet): 2 Feet Assistive device: Rolling walker (2 wheels) Gait Pattern/deviations: Step-to pattern, Decreased step length - right, Decreased step length - left, Shuffle, Trunk flexed Gait velocity: decr     General Gait Details: cues for sequence, posture, position from RW and increased UE WB with noted buckling at L LE   Stairs             Wheelchair Mobility     Tilt Bed    Modified Rankin (Stroke Patients Only)       Balance Overall balance assessment: Needs  assistance Sitting-balance support: Feet supported Sitting balance-Leahy Scale: Good      Standing balance support: Bilateral upper extremity supported, Reliant on assistive device for balance Standing balance-Leahy Scale: Poor                              Communication Communication Communication: No apparent difficulties  Cognition Arousal: Alert Behavior During Therapy: WFL for tasks assessed/performed   PT - Cognitive impairments: No apparent impairments                         Following commands: Intact      Cueing Cueing Techniques: Verbal cues  Exercises      General Comments        Pertinent Vitals/Pain Pain Assessment Pain Assessment: 0-10 Pain Score: 4  Pain Location: L hip Pain Descriptors / Indicators: Aching, Guarding Pain Intervention(s): Limited activity within patient's tolerance, Monitored during session, Ice applied    Home Living                          Prior Function            PT Goals (current goals can now be found in the care plan section) Acute Rehab PT Goals Patient Stated Goal: REgain IND PT Goal Formulation: With patient Time For Goal Achievement: 01/16/24 Potential to Achieve Goals: Good Progress towards PT goals: Not progressing toward goals - comment (fatigue)    Frequency    Min 4X/week      PT Plan      Co-evaluation              AM-PAC PT 6 Clicks Mobility   Outcome Measure  Help needed turning from your back to your side while in a flat bed without using bedrails?: A Lot Help needed moving from lying on your back to sitting on the side of a flat bed without using bedrails?: A Lot Help needed moving to and from a bed to a chair (including a wheelchair)?: Total Help needed standing up from a chair using your arms (e.g., wheelchair or bedside chair)?: Total Help needed to walk in hospital room?: Total Help needed climbing 3-5 steps with a railing? : Total 6 Click Score: 8    End of Session Equipment Utilized During Treatment: Gait belt Activity Tolerance:  Patient limited by fatigue Patient left: in bed;with call bell/phone within reach;with bed alarm set Nurse Communication: Mobility status PT Visit Diagnosis: Difficulty in walking, not elsewhere classified (R26.2)     Time: 8596-8580 PT Time Calculation (min) (ACUTE ONLY): 16 min  Charges:    $Therapeutic Activity: 8-22 mins PT General Charges $$ ACUTE PT VISIT: 1 Visit                     [facility] PT Acute Rehabilitation Services Office [phone]    Eloise Picone 01/03/2024, 3:31 PM

## 2024-01-03 NOTE — Progress Notes (Signed)
 Physical Therapy Treatment Patient Details Name: Steven Blair MRN: [id number] DOB: 01-13-1946 Today's Date: 01/03/2024   History of Present Illness Steven Blair is a 78 y.o. male  who was swimming today after missing he slipped on a wet floor and fell on his left hip.  He has had bilateral hip replacement and previous surgeries before.  He has rod and screws there.  He immediately felt severe pain on the left side.  Did not hit his head. Ortho note:  Acute mildly displaced periprosthetic fracture involving the trochanteric region of the left femur. Based on the stability of his femoral component he can be weightbearing as tolerated through his left lower extremity.  Physical therapy should work with him on mobility restrictions including no active abduction. PMH: B THA, atrial fibrillation, history of DVT, dysrhythmia, previous fracture RIGHT femur, essential hypertension, peripheral vascular disease, ulcerative colitis    PT Comments  Pt continues very motivated but progressing slowly with mobility and requiring step-by-step cues and significant assist of 2 for performance of all basic mobility tasks.  Patient will benefit from continued inpatient follow up therapy, <3 hours/day     If plan is discharge home, recommend the following: Two people to help with walking and/or transfers;A lot of help with bathing/dressing/bathroom;Assistance with cooking/housework;Assist for transportation;Help with stairs or ramp for entrance   Can travel by private vehicle     No  Equipment Recommendations  Rolling walker (2 wheels)    Recommendations for Other Services       Precautions / Restrictions Precautions Precautions: Fall Recall of Precautions/Restrictions: Intact Precaution/Restrictions Comments: NO ACTIVE ABD L LE Restrictions Weight Bearing Restrictions Per Provider Order: Yes LLE Weight Bearing Per Provider Order: Weight bearing as tolerated Other Position/Activity Restrictions: WBAT      Mobility  Bed Mobility Overal bed mobility: Needs Assistance Bed Mobility: Supine to Sit     Supine to sit: +2 for physical assistance, +2 for safety/equipment, Mod assist, HOB elevated, Used rails     General bed mobility comments: increased time, assist for LE management and to scoot and propel trunk over hips to sit with increased effort and assist with use of bed pad and features, cuing step by step    Transfers Overall transfer level: Needs assistance Equipment used: Rolling walker (2 wheels) Transfers: Sit to/from Stand, Bed to chair/wheelchair/BSC Sit to Stand: +2 physical assistance, +2 safety/equipment, Mod assist Stand pivot transfers: Mod assist, +2 physical assistance, +2 safety/equipment, From elevated surface         General transfer comment: heavy reliance thru UE's and R LE, step by step cues for Step pvt bed to [facility] to recliner    Ambulation/Gait Ambulation/Gait assistance: Mod assist, +2 physical assistance, +2 safety/equipment Gait Distance (Feet): 2 Feet Assistive device: Rolling walker (2 wheels) Gait Pattern/deviations: Step-to pattern, Decreased step length - right, Decreased step length - left, Shuffle, Trunk flexed Gait velocity: decr     General Gait Details: cues for sequence, posture, position from RW and increased UE WB with noted buckling at L LE   Stairs             Wheelchair Mobility     Tilt Bed    Modified Rankin (Stroke Patients Only)       Balance Overall balance assessment: Needs assistance Sitting-balance support: Feet supported Sitting balance-Leahy Scale: Good     Standing balance support: Bilateral upper extremity supported, Reliant on assistive device for balance Standing balance-Leahy Scale: Poor  Communication Communication Communication: No apparent difficulties  Cognition Arousal: Alert Behavior During Therapy: WFL for tasks assessed/performed   PT -  Cognitive impairments: No apparent impairments                         Following commands: Intact      Cueing Cueing Techniques: Verbal cues  Exercises      General Comments        Pertinent Vitals/Pain Pain Assessment Pain Assessment: 0-10 Pain Score: 4  Pain Location: L hip Pain Descriptors / Indicators: Aching, Guarding Pain Intervention(s): Limited activity within patient's tolerance, Monitored during session, Premedicated before session    Home Living                          Prior Function            PT Goals (current goals can now be found in the care plan section) Acute Rehab PT Goals Patient Stated Goal: REgain IND PT Goal Formulation: With patient Time For Goal Achievement: 01/16/24 Potential to Achieve Goals: Good Progress towards PT goals: Progressing toward goals    Frequency    Min 5X/week      PT Plan      Co-evaluation              AM-PAC PT 6 Clicks Mobility   Outcome Measure  Help needed turning from your back to your side while in a flat bed without using bedrails?: A Lot Help needed moving from lying on your back to sitting on the side of a flat bed without using bedrails?: A Lot Help needed moving to and from a bed to a chair (including a wheelchair)?: A Lot Help needed standing up from a chair using your arms (e.g., wheelchair or bedside chair)?: A Lot Help needed to walk in hospital room?: Total Help needed climbing 3-5 steps with a railing? : Total 6 Click Score: 10    End of Session Equipment Utilized During Treatment: Gait belt Activity Tolerance: Patient tolerated treatment well Patient left: in chair;with call bell/phone within reach;with chair alarm set Nurse Communication: Mobility status PT Visit Diagnosis: Difficulty in walking, not elsewhere classified (R26.2)     Time: 9143-9076 PT Time Calculation (min) (ACUTE ONLY): 27 min  Charges:    $Therapeutic Activity: 23-37 mins PT General  Charges $$ ACUTE PT VISIT: 1 Visit                     [facility] PT Acute Rehabilitation Services Office [phone]    Jaina Morin 01/03/2024, 1:50 PM

## 2024-01-03 NOTE — Progress Notes (Signed)
 PROGRESS NOTE  Steven Blair  FMW:[id number] DOB: 07/28/1945 DOA: 12/31/2023 PCP: Charlott Dorn LABOR, MD   Brief Narrative: Patient is a 78 year old male with history of permanent atrial fibrillation, DVT, hypertension, peripheral vascular disease, ulcerative colitis who fell on the wet floor of the locker room at swimming pool .  He fell on his left hip and immediately developed pain.  No head injury.  Imaging showed periprosthetic left femoral neck fracture.  He has a history of femur fracture s/p repair and revision by Dr. Ernie.  Patient was unable to bear weight on presentation.  Orthopedics consulted here.  Plan for nonoperative management.  PT/OT consulted  01/03/2024: Patient seen.  No new complaints.  Awaiting discharge to subacute rehab (SNF).  Assessment & Plan:  Principal Problem:   Hip fracture (HCC) Active Problems:   History of DVT of lower extremity   Sleep apnea   Hypertension   Atrial fibrillation (HCC)   Obese   Acute left periprosthetic fracture of the left femur:  -Presented with mechanical fall.  Continue pain management, supportive care.  Orthopedics not planning for surgery.  Plan for nonoperative management.  Orthopedics recommended no active abduction.  PT/OT consulted.  Continue bowel regimen 01/02/2024: Pain is controlled.  Pursue disposition (SNF).  Permanent A-fib:  -Heart rate controlled.  Follows with cardiology.  On Cardizem  for rate control at home.  Takes Pradaxa  for anticoagulation  History of DVT: On Pradaxa .    Hyperlipidemia:On crestor   History of ulcerative colitis: Takes mesalamine .  Currently not on exacerbation  Hypertension: Currently blood pressure stable.  OSA: Continue CPAP at night.  Obesity: BMI 31.7        DVT prophylaxis:SCDs Start: 12/31/23 2059 dabigatran  (PRADAXA ) capsule 150 mg     Code Status: Full Code  Family Communication: Wife at the bedside  Patient status: Inpatient  Patient is from :  Home  Anticipated discharge to: Home versus SNF  Estimated DC date: 1 to 2 days   Consultants: Orthopedic  Procedures: None  Antimicrobials:  Anti-infectives (From admission, onward)    None       Subjective: Patient seen  No new complaints.   Pain is controlled.    Objective: Vitals:   01/02/24 1326 01/02/24 2151 01/03/24 0643 01/03/24 1438  BP: (!) 109/56 131/73 (!) 142/72 114/63  Pulse: 89 92 (!) 104 88  Resp: 16 17 16 18   Temp: 98.9 F (37.2 C) 99.2 F (37.3 C) 98.8 F (37.1 C) 98.2 F (36.8 C)  TempSrc: Oral Oral Oral Oral  SpO2: 97% 98% 98% 98%  Weight:      Height:        Intake/Output Summary (Last 24 hours) at 01/03/2024 1927 Last data filed at 01/03/2024 1522 Gross per 24 hour  Intake 120 ml  Output 2000 ml  Net -1880 ml   Filed Weights   12/31/23 1533  Weight: 106.1 kg    Examination:  General exam: Overall comfortable, not in distress. Respiratory system: Clear to auscultation. Cardiovascular system: S1-S2. Gastrointestinal system: Abdomen is obese, soft and nontender. Central nervous system: Alert and oriented   Data Reviewed: I have personally reviewed following labs and imaging studies  CBC: Recent Labs  Lab 12/31/23 1743 01/01/24 0357 01/02/24 0322  WBC 9.3 7.7 8.9  NEUTROABS 7.6  --   --   HGB 14.9 14.0 13.5  HCT 44.6 42.3 41.3  MCV 105.7* 106.5* 108.1*  PLT 138* 121* 106*   Basic Metabolic Panel: Recent Labs  Lab 12/31/23  1743 01/01/24 0357 01/02/24 0322  NA 139 139 136  K 4.4 4.7 4.9  CL 101 104 101  CO2 26 28 29   GLUCOSE 119* 115* 112*  BUN 27* 18 11  CREATININE 0.88 0.74 0.64  CALCIUM  9.7 8.7* 8.9     No results found for this or any previous visit (from the past 240 hours).   Radiology Studies: No results found.   Scheduled Meds:  dabigatran   150 mg Oral BID   diltiazem   120 mg Oral Daily   mesalamine   2.4 g Oral Daily   polyethylene glycol  17 g Oral Daily   senna-docusate  1 tablet Oral  BID   Continuous Infusions:     LOS: 3 days   [name] Chapel, MD [facility] P12/13/2025, 7:27 PM

## 2024-01-04 DIAGNOSIS — S72002D Fracture of unspecified part of neck of left femur, subsequent encounter for closed fracture with routine healing: Secondary | ICD-10-CM | POA: Diagnosis not present

## 2024-01-04 NOTE — Progress Notes (Signed)
 Physical Therapy Treatment Patient Details Name: Steven Blair MRN: [id number] DOB: [birth year] Today's Date: 01/04/2024   History of Present Illness Steven Blair is a 78 y.o. male  who was swimming today after missing he slipped on a wet floor and fell on his left hip.  He has had bilateral hip replacement and previous surgeries before.  He has rod and screws there.  He immediately felt severe pain on the left side.  Did not hit his head. Ortho note:  Acute mildly displaced periprosthetic fracture involving the trochanteric region of the left femur. Based on the stability of his femoral component he can be weightbearing as tolerated through his left lower extremity.  Physical therapy should work with him on mobility restrictions including no active abduction. PMH: B THA, atrial fibrillation, history of DVT, dysrhythmia, previous fracture RIGHT femur, essential hypertension, peripheral vascular disease, ulcerative colitis    PT Comments  Pt continues very cooperative but progressing slowly with mobility and requiring significant assist of 2 for all basic mobility tasks.  Patient will benefit from continued inpatient follow up therapy, <3 hours/day     If plan is discharge home, recommend the following: Two people to help with walking and/or transfers;A lot of help with bathing/dressing/bathroom;Assistance with cooking/housework;Assist for transportation;Help with stairs or ramp for entrance   Can travel by private vehicle     No  Equipment Recommendations  Rolling walker (2 wheels)    Recommendations for Other Services       Precautions / Restrictions Precautions Precautions: Fall Recall of Precautions/Restrictions: Intact Precaution/Restrictions Comments: NO ACTIVE ABD L LE Restrictions Weight Bearing Restrictions Per Provider Order: Yes LLE Weight Bearing Per Provider Order: Weight bearing as tolerated     Mobility  Bed Mobility Overal bed mobility: Needs Assistance Bed  Mobility: Supine to Sit     Supine to sit: +2 for physical assistance, +2 for safety/equipment, Mod assist, HOB elevated, Used rails     General bed mobility comments: increased time, assist for LE management and to control trunk    Transfers Overall transfer level: Needs assistance Equipment used: Rolling walker (2 wheels) Transfers: Sit to/from Stand, Bed to chair/wheelchair/BSC Sit to Stand: +2 physical assistance, +2 safety/equipment, Max assist Stand pivot transfers: Mod assist, +2 physical assistance, +2 safety/equipment, From elevated surface         General transfer comment: cues for LE management and use of UEs to self assist.  elevated bed; Physical assist to bring wt up and fwd and to balance in standing with RW    Ambulation/Gait Ambulation/Gait assistance: Mod assist, +2 physical assistance, +2 safety/equipment Gait Distance (Feet): 2 Feet Assistive device: Rolling walker (2 wheels) Gait Pattern/deviations: Step-to pattern, Decreased step length - right, Decreased step length - left, Shuffle, Trunk flexed Gait velocity: decr     General Gait Details: cues for sequence, posture, position from RW and increased UE WB with noted buckling at L LE   Stairs             Wheelchair Mobility     Tilt Bed    Modified Rankin (Stroke Patients Only)       Balance Overall balance assessment: Needs assistance Sitting-balance support: Feet supported Sitting balance-Leahy Scale: Good     Standing balance support: Bilateral upper extremity supported, Reliant on assistive device for balance Standing balance-Leahy Scale: Poor  Communication Communication Communication: No apparent difficulties  Cognition Arousal: Alert Behavior During Therapy: WFL for tasks assessed/performed   PT - Cognitive impairments: No apparent impairments                         Following commands: Intact      Cueing Cueing  Techniques: Verbal cues  Exercises      General Comments        Pertinent Vitals/Pain Pain Assessment Pain Assessment: 0-10 Pain Score: 4  Pain Location: L hip Pain Descriptors / Indicators: Aching, Guarding Pain Intervention(s): Limited activity within patient's tolerance, Monitored during session, Premedicated before session, Ice applied    Home Living                          Prior Function            PT Goals (current goals can now be found in the care plan section) Acute Rehab PT Goals Patient Stated Goal: REgain IND PT Goal Formulation: With patient Time For Goal Achievement: 01/16/24 Potential to Achieve Goals: Good Progress towards PT goals: Progressing toward goals    Frequency    Min 4X/week      PT Plan      Co-evaluation              AM-PAC PT 6 Clicks Mobility   Outcome Measure  Help needed turning from your back to your side while in a flat bed without using bedrails?: A Lot Help needed moving from lying on your back to sitting on the side of a flat bed without using bedrails?: A Lot Help needed moving to and from a bed to a chair (including a wheelchair)?: Total Help needed standing up from a chair using your arms (e.g., wheelchair or bedside chair)?: Total Help needed to walk in hospital room?: Total Help needed climbing 3-5 steps with a railing? : Total 6 Click Score: 8    End of Session Equipment Utilized During Treatment: Gait belt Activity Tolerance: Patient limited by fatigue Patient left: in chair;with call bell/phone within reach;with chair alarm set Nurse Communication: Mobility status PT Visit Diagnosis: Difficulty in walking, not elsewhere classified (R26.2)     Time: 9049-8988 PT Time Calculation (min) (ACUTE ONLY): 21 min  Charges:    $Therapeutic Activity: 8-22 mins PT General Charges $$ ACUTE PT VISIT: 1 Visit                     [facility] PT Acute Rehabilitation Services Office  [phone]     [facility] 01/04/2024, 11:04 AM

## 2024-01-04 NOTE — Plan of Care (Signed)
  Problem: Pain Managment: Goal: General experience of comfort will improve and/or be controlled Outcome: Progressing   Problem: Safety: Goal: Ability to remain free from injury will improve Outcome: Progressing   Problem: Skin Integrity: Goal: Risk for impaired skin integrity will decrease Outcome: Progressing

## 2024-01-04 NOTE — Progress Notes (Signed)
 Patient ID: Steven Blair, male   DOB: 1945-10-22, 78 y.o.   MRN: [id number] Subjective:    Patient reports pain as mild to moderate depending on activity level Participating with therapy but still requiring 2 assistants  Objective:   VITALS:   Vitals:   01/03/24 2125 01/04/24 0538  BP: 130/66 133/84  Pulse: 86 96  Resp: 18 18  Temp: 98.1 F (36.7 C) 98.2 F (36.8 C)  SpO2: 99% 94%    Neurovascular intact Still with TTP laterally Pain with ROM LLE  LABS Recent Labs    01/02/24 0322  HGB 13.5  HCT 41.3  WBC 8.9  PLT 106*    Recent Labs    01/02/24 0322  NA 136  K 4.9  BUN 11  CREATININE 0.64  GLUCOSE 112*    No results for input(s): LABPT, INR in the last 72 hours.   Assessment/Plan: Left periprosthetic mildly displaced greater trochanteric femur fracture    Up with therapy Disposition likely to [facility] to work towards safe home transfer RTC in 2-4 weeks for radiographic follow up

## 2024-01-04 NOTE — Progress Notes (Signed)
 Physical Therapy Treatment Patient Details Name: Steven Blair MRN: [id number] DOB: May 27, 1945 Today's Date: 01/04/2024   History of Present Illness Steven Blair is a 78 y.o. male  who was swimming today after missing he slipped on a wet floor and fell on his left hip.  He has had bilateral hip replacement and previous surgeries before.  He has rod and screws there.  He immediately felt severe pain on the left side.  Did not hit his head. Ortho note:  Acute mildly displaced periprosthetic fracture involving the trochanteric region of the left femur. Based on the stability of his femoral component he can be weightbearing as tolerated through his left lower extremity.  Physical therapy should work with him on mobility restrictions including no active abduction. PMH: B THA, atrial fibrillation, history of DVT, dysrhythmia, previous fracture RIGHT femur, essential hypertension, peripheral vascular disease, ulcerative colitis    PT Comments  Pt continues cooperative and progressing slowly with mobility but continues to require step-by-step cues and significant assist for all basic mobility tasks.  Patient will benefit from continued inpatient follow up therapy, <3 hours/day     If plan is discharge home, recommend the following: Two people to help with walking and/or transfers;A lot of help with bathing/dressing/bathroom;Assistance with cooking/housework;Assist for transportation;Help with stairs or ramp for entrance   Can travel by private vehicle     No  Equipment Recommendations  Rolling walker (2 wheels)    Recommendations for Other Services       Precautions / Restrictions Precautions Precautions: Fall Recall of Precautions/Restrictions: Intact Precaution/Restrictions Comments: NO ACTIVE ABD L LE Restrictions Weight Bearing Restrictions Per Provider Order: Yes LLE Weight Bearing Per Provider Order: Weight bearing as tolerated Other Position/Activity Restrictions: WBAT      Mobility  Bed Mobility Overal bed mobility: Needs Assistance Bed Mobility: Sit to Supine     Supine to sit: +2 for physical assistance, +2 for safety/equipment, Mod assist, HOB elevated, Used rails Sit to supine: Mod assist, +2 for physical assistance, +2 for safety/equipment, Used rails   General bed mobility comments: increased time, assist for LE management and to control trunk    Transfers Overall transfer level: Needs assistance Equipment used: Rolling walker (2 wheels) Transfers: Sit to/from Stand, Bed to chair/wheelchair/BSC Sit to Stand: +2 physical assistance, +2 safety/equipment, Max assist Stand pivot transfers: Mod assist, +2 physical assistance, +2 safety/equipment, From elevated surface         General transfer comment: cues for LE management and use of UEs to self assist.  elevated bed; Physical assist to bring wt up and fwd and to balance in standing with RW    Ambulation/Gait Ambulation/Gait assistance: Mod assist, +2 physical assistance, +2 safety/equipment Gait Distance (Feet): 2 Feet Assistive device: Rolling walker (2 wheels) Gait Pattern/deviations: Step-to pattern, Decreased step length - right, Decreased step length - left, Shuffle, Trunk flexed Gait velocity: decr     General Gait Details: cues for sequence, posture, position from RW and increased UE WB with decreased LE buckling noted   Stairs             Wheelchair Mobility     Tilt Bed    Modified Rankin (Stroke Patients Only)       Balance Overall balance assessment: Needs assistance Sitting-balance support: Feet supported Sitting balance-Leahy Scale: Good     Standing balance support: Bilateral upper extremity supported, Reliant on assistive device for balance Standing balance-Leahy Scale: Poor  Communication Communication Communication: No apparent difficulties  Cognition Arousal: Alert Behavior During Therapy: WFL for tasks  assessed/performed   PT - Cognitive impairments: No apparent impairments                         Following commands: Intact      Cueing Cueing Techniques: Verbal cues  Exercises      General Comments        Pertinent Vitals/Pain Pain Assessment Pain Assessment: 0-10 Pain Score: 3  Pain Location: L hip Pain Descriptors / Indicators: Aching, Guarding Pain Intervention(s): Limited activity within patient's tolerance, Monitored during session, Premedicated before session, Ice applied    Home Living                          Prior Function            PT Goals (current goals can now be found in the care plan section) Acute Rehab PT Goals Patient Stated Goal: REgain IND PT Goal Formulation: With patient Time For Goal Achievement: 01/16/24 Potential to Achieve Goals: Good Progress towards PT goals: Progressing toward goals    Frequency    Min 4X/week      PT Plan      Co-evaluation              AM-PAC PT 6 Clicks Mobility   Outcome Measure  Help needed turning from your back to your side while in a flat bed without using bedrails?: A Lot Help needed moving from lying on your back to sitting on the side of a flat bed without using bedrails?: A Lot Help needed moving to and from a bed to a chair (including a wheelchair)?: A Lot Help needed standing up from a chair using your arms (e.g., wheelchair or bedside chair)?: A Lot Help needed to walk in hospital room?: Total Help needed climbing 3-5 steps with a railing? : Total 6 Click Score: 10    End of Session Equipment Utilized During Treatment: Gait belt Activity Tolerance: Patient limited by fatigue Patient left: with call bell/phone within reach;in bed;with bed alarm set Nurse Communication: Mobility status PT Visit Diagnosis: Difficulty in walking, not elsewhere classified (R26.2)     Time: 8589-8571 PT Time Calculation (min) (ACUTE ONLY): 18 min  Charges:    $Therapeutic  Activity: 8-22 mins PT General Charges $$ ACUTE PT VISIT: 1 Visit                     [facility] PT Acute Rehabilitation Services Office [phone]    Anvay Tennis 01/04/2024, 3:30 PM

## 2024-01-04 NOTE — Progress Notes (Signed)
 PROGRESS NOTE  Steven Blair  FMW:[id number] DOB: [birth year] DOA: 12/31/2023 PCP: Charlott Dorn LABOR, MD   Brief Narrative: Patient is a 78 year old male with history of permanent atrial fibrillation, DVT, hypertension, peripheral vascular disease, ulcerative colitis who fell on the wet floor of the locker room at swimming pool .  He fell on his left hip and immediately developed pain.  No head injury.  Imaging showed periprosthetic left femoral neck fracture.  He has a history of femur fracture s/p repair and revision by Dr. Ernie.  Patient was unable to bear weight on presentation.  Orthopedics consulted here.  Plan for nonoperative management.  PT/OT consulted  01/04/2024: Patient seen.  No new complaints.  Awaiting discharge to subacute rehab (SNF).  Assessment & Plan:  Principal Problem:   Hip fracture (HCC) Active Problems:   History of DVT of lower extremity   Sleep apnea   Hypertension   Atrial fibrillation (HCC)   Obese   Acute left periprosthetic fracture of the left femur:  -Presented with mechanical fall.  Continue pain management, supportive care.  Orthopedics not planning for surgery.  Plan for nonoperative management.  Orthopedics recommended no active abduction.  PT/OT consulted.  Continue bowel regimen 01/02/2024: Pain is controlled.  Pursue disposition (SNF).  Permanent A-fib:  -Heart rate controlled.  Follows with cardiology.  On Cardizem  for rate control at home.  Takes Pradaxa  for anticoagulation  History of DVT: On Pradaxa .    Hyperlipidemia:On crestor   History of ulcerative colitis: Takes mesalamine .  Currently not on exacerbation  Hypertension: Currently blood pressure stable.  OSA: Continue CPAP at night.  Obesity: BMI 31.7        DVT prophylaxis:SCDs Start: 12/31/23 2059 dabigatran  (PRADAXA ) capsule 150 mg     Code Status: Full Code  Family Communication: Wife at the bedside  Patient status: Inpatient  Patient is from :  Home  Anticipated discharge to: Home versus SNF  Estimated DC date: 1 to 2 days   Consultants: Orthopedic  Procedures: None  Antimicrobials:  Anti-infectives (From admission, onward)    None       Subjective: Patient seen  No new complaints.   Pain is controlled.    Objective: Vitals:   01/03/24 2125 01/04/24 0538 01/04/24 0814 01/04/24 1256  BP: 130/66 133/84 125/60 (!) 122/58  Pulse: 86 96 87 92  Resp: 18 18  16   Temp: 98.1 F (36.7 C) 98.2 F (36.8 C)  98.1 F (36.7 C)  TempSrc:    Oral  SpO2: 99% 94% 99% 98%  Weight:      Height:        Intake/Output Summary (Last 24 hours) at 01/04/2024 1526 Last data filed at 01/04/2024 1426 Gross per 24 hour  Intake 800 ml  Output 1025 ml  Net -225 ml   Filed Weights   12/31/23 1533  Weight: 106.1 kg    Examination:  General exam: Overall comfortable, not in distress. Respiratory system: Clear to auscultation. Cardiovascular system: S1-S2. Gastrointestinal system: Abdomen is obese, soft and nontender. Central nervous system: Alert and oriented   Data Reviewed: I have personally reviewed following labs and imaging studies  CBC: Recent Labs  Lab 12/31/23 1743 01/01/24 0357 01/02/24 0322  WBC 9.3 7.7 8.9  NEUTROABS 7.6  --   --   HGB 14.9 14.0 13.5  HCT 44.6 42.3 41.3  MCV 105.7* 106.5* 108.1*  PLT 138* 121* 106*   Basic Metabolic Panel: Recent Labs  Lab 12/31/23 1743 01/01/24 0357 01/02/24 0322  NA 139 139 136  K 4.4 4.7 4.9  CL 101 104 101  CO2 26 28 29   GLUCOSE 119* 115* 112*  BUN 27* 18 11  CREATININE 0.88 0.74 0.64  CALCIUM  9.7 8.7* 8.9     No results found for this or any previous visit (from the past 240 hours).   Radiology Studies: No results found.   Scheduled Meds:  dabigatran   150 mg Oral BID   diltiazem   120 mg Oral Daily   mesalamine   2.4 g Oral Daily   polyethylene glycol  17 g Oral Daily   senna-docusate  1 tablet Oral BID   Continuous Infusions:     LOS:  4 days   [name] Chapel, MD [facility] P12/14/2025, 3:26 PM

## 2024-01-04 NOTE — Plan of Care (Signed)
  Problem: Education: Goal: Knowledge of General Education information will improve Description: Including pain rating scale, medication(s)/side effects and non-pharmacologic comfort measures Outcome: Progressing   Problem: Health Behavior/Discharge Planning: Goal: Ability to manage health-related needs will improve Outcome: Progressing   Problem: Clinical Measurements: Goal: Ability to maintain clinical measurements within normal limits will improve Outcome: Progressing Goal: Will remain free from infection Outcome: Progressing Goal: Diagnostic test results will improve Outcome: Progressing Goal: Respiratory complications will improve Outcome: Progressing Goal: Cardiovascular complication will be avoided Outcome: Progressing   Problem: Activity: Goal: Risk for activity intolerance will decrease Outcome: Progressing   Problem: Coping: Goal: Level of anxiety will decrease Outcome: Progressing   Problem: Elimination: Goal: Will not experience complications related to bowel motility Outcome: Progressing   Problem: Pain Managment: Goal: General experience of comfort will improve and/or be controlled Outcome: Progressing   Problem: Safety: Goal: Ability to remain free from injury will improve Outcome: Progressing   Problem: Skin Integrity: Goal: Risk for impaired skin integrity will decrease Outcome: Progressing

## 2024-01-05 NOTE — Plan of Care (Signed)
  Problem: Education: Goal: Knowledge of General Education information will improve Description: Including pain rating scale, medication(s)/side effects and non-pharmacologic comfort measures Outcome: Progressing   Problem: Health Behavior/Discharge Planning: Goal: Ability to manage health-related needs will improve Outcome: Progressing   Problem: Clinical Measurements: Goal: Ability to maintain clinical measurements within normal limits will improve Outcome: Progressing Goal: Will remain free from infection Outcome: Progressing Goal: Diagnostic test results will improve Outcome: Progressing Goal: Respiratory complications will improve Outcome: Progressing Goal: Cardiovascular complication will be avoided Outcome: Progressing   Problem: Activity: Goal: Risk for activity intolerance will decrease Outcome: Progressing   Problem: Coping: Goal: Level of anxiety will decrease Outcome: Progressing   Problem: Elimination: Goal: Will not experience complications related to bowel motility Outcome: Progressing   Problem: Pain Managment: Goal: General experience of comfort will improve and/or be controlled Outcome: Progressing   Problem: Safety: Goal: Ability to remain free from injury will improve Outcome: Progressing   Problem: Skin Integrity: Goal: Risk for impaired skin integrity will decrease Outcome: Progressing

## 2024-01-05 NOTE — Progress Notes (Signed)
 PROGRESS NOTE  Steven Blair  FMW:[id number] DOB: Aug 01, 1945 DOA: 12/31/2023 PCP: Charlott Dorn LABOR, MD   Brief Narrative: Patient is a 78 year old male with history of permanent atrial fibrillation, DVT, hypertension, peripheral vascular disease, ulcerative colitis who fell on the wet floor of the locker room at swimming pool .  He fell on his left hip and immediately developed pain.  No head injury.  Imaging showed periprosthetic left femoral neck fracture.  He has a history of femur fracture s/p repair and revision by Dr. Ernie.  Patient was unable to bear weight on presentation.  Orthopedics consulted here.  Plan for nonoperative management.  PT/OT consulted  01/05/2024: Patient seen.  No new complaints.  Awaiting discharge to subacute rehab (SNF).  Insurance approval is still pending (according to child psychotherapist)..  Assessment & Plan:  Principal Problem:   Hip fracture (HCC) Active Problems:   History of DVT of lower extremity   Sleep apnea   Hypertension   Atrial fibrillation (HCC)   Obese   Acute left periprosthetic fracture of the left femur:  -Presented with mechanical fall.  Continue pain management, supportive care.  Orthopedics not planning for surgery.  Plan for nonoperative management.  Orthopedics recommended no active abduction.  PT/OT consulted.  Continue bowel regimen 01/05/2024: Pain is controlled.  Pursue disposition (SNF).  Permanent A-fib:  -Heart rate controlled.  Follows with cardiology.  On Cardizem  for rate control at home.  Takes Pradaxa  for anticoagulation  History of DVT: On Pradaxa .    Hyperlipidemia:On crestor   History of ulcerative colitis: Takes mesalamine .  Currently not on exacerbation  Hypertension: Currently blood pressure stable.  OSA: Continue CPAP at night.  Obesity: BMI 31.7        DVT prophylaxis:SCDs Start: 12/31/23 2059 dabigatran  (PRADAXA ) capsule 150 mg     Code Status: Full Code  Family Communication: Wife at the  bedside  Patient status: Inpatient  Patient is from : Home  Anticipated discharge to: Home versus SNF  Estimated DC date: 1 to 2 days   Consultants: Orthopedic  Procedures: None  Antimicrobials:  Anti-infectives (From admission, onward)    None       Subjective: Patient seen  No new complaints.   Pain is controlled.    Objective: Vitals:   01/04/24 1256 01/04/24 2127 01/05/24 0510 01/05/24 1316  BP: (!) 122/58 120/65 121/74 134/79  Pulse: 92 91 88 93  Resp: 16 17 15 18   Temp: 98.1 F (36.7 C) 98.1 F (36.7 C) 97.8 F (36.6 C) 98.6 F (37 C)  TempSrc: Oral Oral Oral Oral  SpO2: 98% 95% 97% 100%  Weight:      Height:        Intake/Output Summary (Last 24 hours) at 01/05/2024 1910 Last data filed at 01/05/2024 1330 Gross per 24 hour  Intake 720 ml  Output 1400 ml  Net -680 ml   Filed Weights   12/31/23 1533  Weight: 106.1 kg    Examination:  General exam: Overall comfortable, not in distress. Respiratory system: Clear to auscultation. Cardiovascular system: S1-S2. Gastrointestinal system: Abdomen is obese, soft and nontender. Central nervous system: Alert and oriented   Data Reviewed: I have personally reviewed following labs and imaging studies  CBC: Recent Labs  Lab 12/31/23 1743 01/01/24 0357 01/02/24 0322  WBC 9.3 7.7 8.9  NEUTROABS 7.6  --   --   HGB 14.9 14.0 13.5  HCT 44.6 42.3 41.3  MCV 105.7* 106.5* 108.1*  PLT 138* 121* 106*  Basic Metabolic Panel: Recent Labs  Lab 12/31/23 1743 01/01/24 0357 01/02/24 0322  NA 139 139 136  K 4.4 4.7 4.9  CL 101 104 101  CO2 26 28 29   GLUCOSE 119* 115* 112*  BUN 27* 18 11  CREATININE 0.88 0.74 0.64  CALCIUM  9.7 8.7* 8.9     No results found for this or any previous visit (from the past 240 hours).   Radiology Studies: No results found.   Scheduled Meds:  dabigatran   150 mg Oral BID   diltiazem   120 mg Oral Daily   mesalamine   2.4 g Oral Daily   polyethylene glycol  17 g  Oral Daily   senna-docusate  1 tablet Oral BID   Continuous Infusions:     LOS: 5 days   [name] Chapel, MD [facility] P12/15/2025, 7:10 PM

## 2024-01-05 NOTE — TOC Progression Note (Addendum)
 Transition of Care [facility]) - Progression Note    Patient Details  Name: Steven Blair MRN: [id number] Date of Birth: 02/27/1945  Transition of Care [facility]) CM/SW Contact  Heather DELENA Saltness, LCSW Phone Number: 01/05/2024, 10:17 AM  Clinical Narrative:     ADDENDUM  CSW met with pt and spouse at bedside to discuss SNF bed offers and obtain pt's preferred facility. CSW provided pt with list of facilities with available beds, including name of facility, location, and Medicare Star-Ratings. Pt and spouse choose [facility]. CSW spoke with Glenys at [facility] to confirm acceptance of bed offer. Glenys reports facility will request insurance authorization. TOC will continue to follow.  Pt has no bed offers for short-term SNF rehab. CSW widened bed search, sent referrals to SNF locations in [place] and surrounding areas. Currently pending bed offers. TOC will continue to follow.  Medicare Star-Ratings  [facility] [address] [place], [place] [address] [phone] Overall rating ???? Above average  [facility] [address] [place], [place] [address] [phone] Overall rating ??? Above average  [facility] [address] [place], [place] [address] [phone] Overall rating ??? Above average   Expected Discharge Plan: Skilled Nursing Facility Barriers to Discharge: Insurance Authorization, SNF Pending bed offer   Expected Discharge Plan and Services In-house Referral: Clinical Social Work   Post Acute Care Choice: Skilled Nursing Facility Living arrangements for the past 2 months: Single Family Home                 DME Arranged: N/A DME Agency: NA     Social Drivers of Health (SDOH) Interventions SDOH Screenings   Food Insecurity: No Food Insecurity (12/31/2023)  Housing: Low Risk (12/31/2023)  Transportation Needs: No Transportation Needs (12/31/2023)  Utilities: Not At Risk (12/31/2023)   Social Connections: Moderately Isolated (12/31/2023)  Tobacco Use: Low Risk (01/01/2024)    Readmission Risk Interventions    01/02/2024    2:13 PM  Readmission Risk Prevention Plan  Post Dischage Appt Complete  Medication Screening Complete  Transportation Screening Complete    Signed: Heather Saltness, MSW, LCSW Clinical Social Worker Inpatient Care Management 01/05/2024 10:19 AM

## 2024-01-06 NOTE — Plan of Care (Signed)
  Problem: Education: Goal: Knowledge of General Education information will improve Description: Including pain rating scale, medication(s)/side effects and non-pharmacologic comfort measures Outcome: Progressing   Problem: Health Behavior/Discharge Planning: Goal: Ability to manage health-related needs will improve Outcome: Progressing   Problem: Clinical Measurements: Goal: Ability to maintain clinical measurements within normal limits will improve Outcome: Progressing Goal: Will remain free from infection Outcome: Progressing Goal: Diagnostic test results will improve Outcome: Progressing Goal: Respiratory complications will improve Outcome: Progressing Goal: Cardiovascular complication will be avoided Outcome: Progressing   Problem: Activity: Goal: Risk for activity intolerance will decrease Outcome: Progressing   Problem: Coping: Goal: Level of anxiety will decrease Outcome: Progressing   Problem: Elimination: Goal: Will not experience complications related to bowel motility Outcome: Progressing   Problem: Pain Managment: Goal: General experience of comfort will improve and/or be controlled Outcome: Progressing   Problem: Safety: Goal: Ability to remain free from injury will improve Outcome: Progressing   Problem: Skin Integrity: Goal: Risk for impaired skin integrity will decrease Outcome: Progressing

## 2024-01-06 NOTE — Progress Notes (Signed)
 Progress Note    KRU ALLMAN   FMW:[id number]  DOB: [birth year]  DOA: 12/31/2023     6 PCP: Charlott Dorn LABOR, MD  Initial CC: fall  Hospital Course: Mr. Huot is a 78 year old male with history of permanent atrial fibrillation, DVT, hypertension, peripheral vascular disease, ulcerative colitis who fell on the wet floor of the locker room at [facility].  He fell on his left hip and immediately developed pain. No head injury. Imaging showed periprosthetic left femoral neck fracture.  He has a history of femur fracture s/p repair and revision by Dr. Ernie.  Patient was unable to bear weight on presentation.  Orthopedics consulted here.  Plan for nonoperative management.  PT/OT consulted. SNF recommended.    Assessment & Plan:   Acute left periprosthetic fracture of the left femur -Presented with mechanical fall.  Continue pain management, supportive care.  Orthopedics not planning for surgery.  Plan for nonoperative management.  Orthopedics recommended no active abduction.  PT/OT consulted.  Continue bowel regimen - Awaiting SNF placement.  Otherwise stable - Outpatient follow-up with orthopedic surgery in 2 to 4 weeks for repeat images   Permanent A-fib -Heart rate controlled.  Follows with cardiology.  On Cardizem  for rate control at home. Takes Pradaxa  for anticoagulation   History of DVT: On Pradaxa    Hyperlipidemia:On crestor    History of ulcerative colitis: Takes mesalamine .  Currently not on exacerbation   Hypertension: Currently blood pressure stable   OSA: Continue CPAP at night   Obesity: BMI 31.7  Interval History:  No events overnight.  Resting comfortably in recliner. Stable and awaiting SNF placement.   Antimicrobials:   DVT prophylaxis:  SCDs Start: 12/31/23 2059 dabigatran  (PRADAXA ) capsule 150 mg   Code Status:   Code Status: Full Code  Mobility Assessment (Last 72 Hours)     Mobility Assessment     Row Name 01/06/24 1507 01/06/24 0815 01/05/24  2058 01/05/24 0800 01/04/24 1952   Does the patient have exclusion criteria? -- No- Perform mobility assessment No- Perform mobility assessment No- Perform mobility assessment No- Perform mobility assessment   What is the highest level of mobility based on the mobility assessment? Level 3 (Stands with assistance) - Balance while standing  and cannot march in place Level 3 (Stands with assistance) - Balance while standing  and cannot march in place Level 3 (Stands with assistance) - Balance while standing  and cannot march in place Level 3 (Stands with assistance) - Balance while standing  and cannot march in place Level 3 (Stands with assistance) - Balance while standing  and cannot march in place   Is the above level different from baseline mobility prior to current illness? -- Yes - Recommend PT order Yes - Recommend PT order Yes - Recommend PT order Yes - Recommend PT order    Row Name 01/04/24 1500 01/04/24 1100 01/04/24 0814 01/03/24 2200     Does the patient have exclusion criteria? -- -- No- Perform mobility assessment No- Perform mobility assessment    What is the highest level of mobility based on the mobility assessment? Level 3 (Stands with assistance) - Balance while standing  and cannot march in place Level 3 (Stands with assistance) - Balance while standing  and cannot march in place Level 3 (Stands with assistance) - Balance while standing  and cannot march in place Level 3 (Stands with assistance) - Balance while standing  and cannot march in place    Is the above level different from  baseline mobility prior to current illness? -- -- Yes - Recommend PT order Yes - Recommend PT order       Diet: Diet Orders (From admission, onward)     Start     Ordered   01/01/24 0906  Diet Heart Room service appropriate? Yes; Fluid consistency: Thin  Diet effective now       Question Answer Comment  Room service appropriate? Yes   Fluid consistency: Thin      01/01/24 0905             Barriers to discharge: None Disposition Plan: SNF HH orders placed: N/A Status is: Inpatient  Objective: Blood pressure 116/74, pulse 91, temperature (!) 97.4 F (36.3 C), temperature source Oral, resp. rate 15, height 6' (1.829 m), weight 106.1 kg, SpO2 97%.  Examination:  Physical Exam Constitutional:      General: He is not in acute distress.    Appearance: Normal appearance.  HENT:     Head: Normocephalic and atraumatic.     Mouth/Throat:     Mouth: Mucous membranes are moist.  Eyes:     Extraocular Movements: Extraocular movements intact.  Cardiovascular:     Rate and Rhythm: Normal rate and regular rhythm.  Pulmonary:     Effort: Pulmonary effort is normal. No respiratory distress.     Breath sounds: Normal breath sounds. No wheezing.  Abdominal:     General: Bowel sounds are normal. There is no distension.     Palpations: Abdomen is soft.     Tenderness: There is no abdominal tenderness.  Musculoskeletal:     Cervical back: Normal range of motion and neck supple.  Skin:    General: Skin is warm and dry.  Neurological:     General: No focal deficit present.     Mental Status: He is alert.  Psychiatric:        Mood and Affect: Mood normal.        Behavior: Behavior normal.      Consultants:  Orthopedic surgery  Procedures:    Data Reviewed: No results found for this or any previous visit (from the past 24 hours).  I have reviewed pertinent nursing notes, vitals, labs, and images as necessary. I have ordered labwork to follow up on as indicated.  I have reviewed the last notes from staff over past 24 hours. I have discussed patient's care plan and test results with nursing staff, CM/SW, and other staff as appropriate.  Old records reviewed in assessment of this patient  Time spent: Greater than 50% of the 55 minute visit was spent in counseling/coordination of care for the patient as laid out in the A&P.   LOS: 6 days   Alm Apo, MD [facility] 01/06/2024, 4:35 PM

## 2024-01-06 NOTE — Hospital Course (Addendum)
 Mr. Krise is a 78 year old male with history of permanent atrial fibrillation, DVT, hypertension, peripheral vascular disease, ulcerative colitis who fell on the wet floor of the locker room at [facility].  He fell on his left hip and immediately developed pain. No head injury. Imaging showed periprosthetic left femoral neck fracture.  He has a history of femur fracture s/p repair and revision by Dr. Ernie.  Patient was unable to bear weight on presentation.  Orthopedics consulted here.  Plan for nonoperative management.  PT/OT consulted. SNF recommended.    Assessment & Plan:   Acute left periprosthetic fracture of the left femur -Presented with mechanical fall.  Continue pain management, supportive care.  Orthopedics not planning for surgery.  Plan for nonoperative management.  Orthopedics recommended no active abduction.  PT/OT consulted.  Continue bowel regimen - Awaiting SNF placement.  Otherwise stable - Outpatient follow-up with orthopedic surgery in 2 to 4 weeks for repeat images   Permanent A-fib -Heart rate controlled.  Follows with cardiology.  On Cardizem  for rate control at home. Takes Pradaxa  for anticoagulation  Dehydration -Urine color change noted to brownish in color morning of 12/17; patient had been ingesting increased amounts of soda -Labs checked and mildly elevated BUN but normal creatinine and no change in hemoglobin -Recommended to increase oral intake with water  and other liquids non-soda based   History of DVT: On Pradaxa    Hyperlipidemia:On crestor    History of ulcerative colitis: Takes mesalamine .  Currently not on exacerbation   Hypertension: Currently blood pressure stable   OSA: Continue CPAP at night   Obesity: BMI 31.7

## 2024-01-06 NOTE — Progress Notes (Signed)
 Physical Therapy Treatment Patient Details Name: Steven Blair MRN: [id number] DOB: 1945/05/11 Today's Date: 01/06/2024   History of Present Illness Steven Blair is a 78 y.o. male  who was swimming today after missing he slipped on a wet floor and fell on his left hip.  He has had bilateral hip replacement and previous surgeries before.  He has rod and screws there.  He immediately felt severe pain on the left side.  Did not hit his head. Ortho note:  Acute mildly displaced periprosthetic fracture involving the trochanteric region of the left femur. Based on the stability of his femoral component he can be weightbearing as tolerated through his left lower extremity.  Physical therapy should work with him on mobility restrictions including no active abduction. PMH: B THA, atrial fibrillation, history of DVT, dysrhythmia, previous fracture RIGHT femur, essential hypertension, peripheral vascular disease, ulcerative colitis    PT Comments  Pt is AxO x 3 pleasant and very motivated.  Prior to his fall at the pool [facility]) he was IND amb, driving and living home with Spouse.  Assisted OOB to amb required + 2 assist and much effort.  General transfer comment: from a VERY elevated bed and + 2 side by side assist, Pt was able to rise onto B platform EVA walker. General Gait Details: Pt was able to amb 10 feet with heavy lean on B platform EVA walker and a third assist following with recliner.  Pt was able to self advance L LE, take small steps. Distance limited by increased pain, effort. Recliner following.  Returned to room and performed a few TE's on AP, knee presses and gluteal squeezes.    LPT has rec Pt will need ST Rehab at SNF to address mobility and functional decline prior to safely returning home.    If plan is discharge home, recommend the following: Two people to help with walking and/or transfers;A lot of help with bathing/dressing/bathroom;Assistance with cooking/housework;Assist for  transportation;Help with stairs or ramp for entrance   Can travel by private vehicle     No  Equipment Recommendations  Rolling walker (2 wheels)    Recommendations for Other Services       Precautions / Restrictions Precautions Precautions: Fall Precaution/Restrictions Comments: NO ACTIVE ABD L LE Restrictions Weight Bearing Restrictions Per Provider Order: No LLE Weight Bearing Per Provider Order: Weight bearing as tolerated     Mobility  Bed Mobility Overal bed mobility: Needs Assistance Bed Mobility: Supine to Sit     Supine to sit: +2 for physical assistance, +2 for safety/equipment, Mod assist, HOB elevated, Used rails     General bed mobility comments: increased time, assist for LE management to avoid active hip ABD    Transfers Overall transfer level: Needs assistance   Transfers: Sit to/from Stand Sit to Stand: +2 physical assistance, +2 safety/equipment, Max assist, Mod assist           General transfer comment: from a VERY elevated bed and + 2 side by side assist, Pt was able to rise onto B platform EVA walker.    Ambulation/Gait Ambulation/Gait assistance: Mod assist, +2 physical assistance, +2 safety/equipment Gait Distance (Feet): 10 Feet Assistive device: Elyn Finder Gait Pattern/deviations: Step-to pattern, Decreased step length - right, Decreased step length - left, Shuffle, Trunk flexed Gait velocity: decr     General Gait Details: Pt was able to amb 10 feet with heavy lean on B platform EVA walker and a third assist following with recliner.  Pt  was able to self advance L LE, take small steps. Distance limited by increased pain, effort.   Stairs             Wheelchair Mobility     Tilt Bed    Modified Rankin (Stroke Patients Only)       Balance                                            Communication    Cognition Arousal: Alert Behavior During Therapy: WFL for tasks assessed/performed   PT -  Cognitive impairments: No apparent impairments                       PT - Cognition Comments: Pt AxO x 3 pleasant and highly motivated.  Prior to his fall at the pool [facility]) he was IND amb, driving and living home with Spouse. Following commands: Intact      Cueing Cueing Techniques: Verbal cues  Exercises  10 reps AP  10 reps knee presses   10 reps gluteal squeezes    General Comments        Pertinent Vitals/Pain Pain Assessment Pain Assessment: 0-10 Pain Score: 7  Pain Location: L hip Pain Descriptors / Indicators: Grimacing, Tender Pain Intervention(s): Monitored during session, Premedicated before session, Repositioned, Ice applied    Home Living                          Prior Function            PT Goals (current goals can now be found in the care plan section) Progress towards PT goals: Progressing toward goals    Frequency    Min 4X/week      PT Plan      Co-evaluation              AM-PAC PT 6 Clicks Mobility   Outcome Measure  Help needed turning from your back to your side while in a flat bed without using bedrails?: A Lot Help needed moving from lying on your back to sitting on the side of a flat bed without using bedrails?: A Lot Help needed moving to and from a bed to a chair (including a wheelchair)?: A Lot Help needed standing up from a chair using your arms (e.g., wheelchair or bedside chair)?: A Lot Help needed to walk in hospital room?: A Lot Help needed climbing 3-5 steps with a railing? : Total 6 Click Score: 11    End of Session Equipment Utilized During Treatment: Gait belt Activity Tolerance: Patient limited by fatigue;Patient limited by pain Patient left: in chair;with call bell/phone within reach;with chair alarm set Nurse Communication: Mobility status PT Visit Diagnosis: Difficulty in walking, not elsewhere classified (R26.2)     Time: 1112-1140 PT Time Calculation (min) (ACUTE ONLY): 28  min  Charges:    $Gait Training: 8-22 mins $Therapeutic Activity: 8-22 mins PT General Charges $$ ACUTE PT VISIT: 1 Visit                     {Asusena Sigley  PTA Acute  Rehabilitation Services Office M-F          [phone]

## 2024-01-07 DIAGNOSIS — I4811 Longstanding persistent atrial fibrillation: Secondary | ICD-10-CM | POA: Diagnosis not present

## 2024-01-07 DIAGNOSIS — S72002A Fracture of unspecified part of neck of left femur, initial encounter for closed fracture: Secondary | ICD-10-CM | POA: Diagnosis not present

## 2024-01-07 LAB — CBC WITH DIFFERENTIAL/PLATELET
Abs Immature Granulocytes: 0.05 K/uL (ref 0.00–0.07)
Basophils Absolute: 0 K/uL (ref 0.0–0.1)
Basophils Relative: 0 %
Eosinophils Absolute: 0.1 K/uL (ref 0.0–0.5)
Eosinophils Relative: 1 %
HCT: 45 % (ref 39.0–52.0)
Hemoglobin: 15.2 g/dL (ref 13.0–17.0)
Immature Granulocytes: 1 %
Lymphocytes Relative: 11 %
Lymphs Abs: 0.9 K/uL (ref 0.7–4.0)
MCH: 35.5 pg — ABNORMAL HIGH (ref 26.0–34.0)
MCHC: 33.8 g/dL (ref 30.0–36.0)
MCV: 105.1 fL — ABNORMAL HIGH (ref 80.0–100.0)
Monocytes Absolute: 0.7 K/uL (ref 0.1–1.0)
Monocytes Relative: 9 %
Neutro Abs: 6.4 K/uL (ref 1.7–7.7)
Neutrophils Relative %: 78 %
Platelets: 192 K/uL (ref 150–400)
RBC: 4.28 MIL/uL (ref 4.22–5.81)
RDW: 12.1 % (ref 11.5–15.5)
WBC: 8.2 K/uL (ref 4.0–10.5)
nRBC: 0 % (ref 0.0–0.2)

## 2024-01-07 LAB — BASIC METABOLIC PANEL WITH GFR
Anion gap: 10 (ref 5–15)
BUN: 25 mg/dL — ABNORMAL HIGH (ref 8–23)
CO2: 30 mmol/L (ref 22–32)
Calcium: 9.5 mg/dL (ref 8.9–10.3)
Chloride: 95 mmol/L — ABNORMAL LOW (ref 98–111)
Creatinine, Ser: 0.89 mg/dL (ref 0.61–1.24)
GFR, Estimated: >60 mL/min (ref >60)
Glucose, Bld: 114 mg/dL — ABNORMAL HIGH (ref 70–99)
Potassium: 4.8 mmol/L (ref 3.5–5.1)
Sodium: 135 mmol/L (ref 135–145)

## 2024-01-07 MED ORDER — OXYCODONE HCL 5 MG PO TABS
5.0000 mg | ORAL_TABLET | ORAL | Status: DC | PRN
  Administered 2024-01-07 – 2024-01-08 (×4): 5 mg via ORAL
  Filled 2024-01-07 (×4): qty 1

## 2024-01-07 NOTE — Plan of Care (Signed)
  Problem: Education: Goal: Knowledge of General Education information will improve Description: Including pain rating scale, medication(s)/side effects and non-pharmacologic comfort measures Outcome: Progressing   Problem: Health Behavior/Discharge Planning: Goal: Ability to manage health-related needs will improve Outcome: Progressing   Problem: Clinical Measurements: Goal: Ability to maintain clinical measurements within normal limits will improve Outcome: Progressing Goal: Will remain free from infection Outcome: Progressing Goal: Diagnostic test results will improve Outcome: Progressing Goal: Respiratory complications will improve Outcome: Progressing Goal: Cardiovascular complication will be avoided Outcome: Progressing   Problem: Activity: Goal: Risk for activity intolerance will decrease Outcome: Progressing   Problem: Coping: Goal: Level of anxiety will decrease Outcome: Progressing   Problem: Elimination: Goal: Will not experience complications related to bowel motility Outcome: Progressing   Problem: Pain Managment: Goal: General experience of comfort will improve and/or be controlled Outcome: Progressing   Problem: Safety: Goal: Ability to remain free from injury will improve Outcome: Progressing   Problem: Skin Integrity: Goal: Risk for impaired skin integrity will decrease Outcome: Progressing

## 2024-01-07 NOTE — Plan of Care (Signed)

## 2024-01-07 NOTE — Progress Notes (Signed)
 Progress Note    Steven Blair   FMW:[id number]  DOB: 1945-08-23  DOA: 12/31/2023     7 PCP: Charlott Dorn LABOR, MD  Initial CC: fall  Hospital Course: Mr. Steven Blair is a 78 year old male with history of permanent atrial fibrillation, DVT, hypertension, peripheral vascular disease, ulcerative colitis who fell on the wet floor of the locker room at [facility].  He fell on his left hip and immediately developed pain. No head injury. Imaging showed periprosthetic left femoral neck fracture.  He has a history of femur fracture s/p repair and revision by Dr. Ernie.  Patient was unable to bear weight on presentation.  Orthopedics consulted here.  Plan for nonoperative management.  PT/OT consulted. SNF recommended.    Assessment & Plan:   Acute left periprosthetic fracture of the left femur -Presented with mechanical fall.  Continue pain management, supportive care.  Orthopedics not planning for surgery.  Plan for nonoperative management.  Orthopedics recommended no active abduction.  PT/OT consulted.  Continue bowel regimen - Awaiting SNF placement.  Otherwise stable - Outpatient follow-up with orthopedic surgery in 2 to 4 weeks for repeat images   Permanent A-fib -Heart rate controlled.  Follows with cardiology.  On Cardizem  for rate control at home. Takes Pradaxa  for anticoagulation  Dehydration -Urine color change noted to brownish in color morning of 12/17; patient had been ingesting increased amounts of soda -Labs checked and mildly elevated BUN but normal creatinine and no change in hemoglobin -Recommended to increase oral intake with water  and other liquids non-soda based   History of DVT: On Pradaxa    Hyperlipidemia:On crestor    History of ulcerative colitis: Takes mesalamine .  Currently not on exacerbation   Hypertension: Currently blood pressure stable   OSA: Continue CPAP at night   Obesity: BMI 31.7  Interval History:  Staff and patient concerned in darkened color change of  urine this morning.  Labs checked and reassuring. Still awaiting placement.   Antimicrobials:   DVT prophylaxis:  SCDs Start: 12/31/23 2059 dabigatran  (PRADAXA ) capsule 150 mg   Code Status:   Code Status: Full Code  Mobility Assessment (Last 72 Hours)     Mobility Assessment     Row Name 01/07/24 1303 01/07/24 0940 01/06/24 2130 01/06/24 1507 01/06/24 0815   Does the patient have exclusion criteria? -- No- Perform mobility assessment No- Perform mobility assessment -- No- Perform mobility assessment   What is the highest level of mobility based on the mobility assessment? Level 3 (Stands with assistance) - Balance while standing  and cannot march in place Level 3 (Stands with assistance) - Balance while standing  and cannot march in place Level 3 (Stands with assistance) - Balance while standing  and cannot march in place Level 3 (Stands with assistance) - Balance while standing  and cannot march in place Level 3 (Stands with assistance) - Balance while standing  and cannot march in place   Is the above level different from baseline mobility prior to current illness? -- Yes - Recommend PT order Yes - Recommend PT order -- Yes - Recommend PT order    Row Name 01/05/24 2058 01/05/24 0800 01/04/24 1952       Does the patient have exclusion criteria? No- Perform mobility assessment No- Perform mobility assessment No- Perform mobility assessment     What is the highest level of mobility based on the mobility assessment? Level 3 (Stands with assistance) - Balance while standing  and cannot march in place Level 3 (Stands with  assistance) - Balance while standing  and cannot march in place Level 3 (Stands with assistance) - Balance while standing  and cannot march in place     Is the above level different from baseline mobility prior to current illness? Yes - Recommend PT order Yes - Recommend PT order Yes - Recommend PT order        Diet: Diet Orders (From admission, onward)     Start      Ordered   01/01/24 0906  Diet Heart Room service appropriate? Yes; Fluid consistency: Thin  Diet effective now       Question Answer Comment  Room service appropriate? Yes   Fluid consistency: Thin      01/01/24 0905            Barriers to discharge: None Disposition Plan: SNF HH orders placed: N/A Status is: Inpatient  Objective: Blood pressure 125/65, pulse 88, temperature 97.8 F (36.6 C), temperature source Oral, resp. rate 17, height 6' (1.829 m), weight 106.1 kg, SpO2 99%.  Examination:  Physical Exam Constitutional:      General: He is not in acute distress.    Appearance: Normal appearance.  HENT:     Head: Normocephalic and atraumatic.     Mouth/Throat:     Mouth: Mucous membranes are moist.  Eyes:     Extraocular Movements: Extraocular movements intact.  Cardiovascular:     Rate and Rhythm: Normal rate and regular rhythm.  Pulmonary:     Effort: Pulmonary effort is normal. No respiratory distress.     Breath sounds: Normal breath sounds. No wheezing.  Abdominal:     General: Bowel sounds are normal. There is no distension.     Palpations: Abdomen is soft.     Tenderness: There is no abdominal tenderness.  Musculoskeletal:     Cervical back: Normal range of motion and neck supple.  Skin:    General: Skin is warm and dry.  Neurological:     General: No focal deficit present.     Mental Status: He is alert.  Psychiatric:        Mood and Affect: Mood normal.        Behavior: Behavior normal.      Consultants:  Orthopedic surgery  Procedures:    Data Reviewed: Results for orders placed or performed during the hospital encounter of 12/31/23 (from the past 24 hours)  Basic metabolic panel with GFR     Status: Abnormal   Collection Time: 01/07/24 12:35 PM  Result Value Ref Range   Sodium 135 135 - 145 mmol/L   Potassium 4.8 3.5 - 5.1 mmol/L   Chloride 95 (L) 98 - 111 mmol/L   CO2 30 22 - 32 mmol/L   Glucose, Bld 114 (H) 70 - 99 mg/dL   BUN 25  (H) 8 - 23 mg/dL   Creatinine, Ser 9.10 0.61 - 1.24 mg/dL   Calcium  9.5 8.9 - 10.3 mg/dL   GFR, Estimated >39 >39 mL/min   Anion gap 10 5 - 15  CBC with Differential/Platelet     Status: Abnormal   Collection Time: 01/07/24 12:35 PM  Result Value Ref Range   WBC 8.2 4.0 - 10.5 K/uL   RBC 4.28 4.22 - 5.81 MIL/uL   Hemoglobin 15.2 13.0 - 17.0 g/dL   HCT 54.9 60.9 - 47.9 %   MCV 105.1 (H) 80.0 - 100.0 fL   MCH 35.5 (H) 26.0 - 34.0 pg   MCHC 33.8 30.0 - 36.0 g/dL  RDW 12.1 11.5 - 15.5 %   Platelets 192 150 - 400 K/uL   nRBC 0.0 0.0 - 0.2 %   Neutrophils Relative % 78 %   Neutro Abs 6.4 1.7 - 7.7 K/uL   Lymphocytes Relative 11 %   Lymphs Abs 0.9 0.7 - 4.0 K/uL   Monocytes Relative 9 %   Monocytes Absolute 0.7 0.1 - 1.0 K/uL   Eosinophils Relative 1 %   Eosinophils Absolute 0.1 0.0 - 0.5 K/uL   Basophils Relative 0 %   Basophils Absolute 0.0 0.0 - 0.1 K/uL   Immature Granulocytes 1 %   Abs Immature Granulocytes 0.05 0.00 - 0.07 K/uL    I have reviewed pertinent nursing notes, vitals, labs, and images as necessary. I have ordered labwork to follow up on as indicated.  I have reviewed the last notes from staff over past 24 hours. I have discussed patient's care plan and test results with nursing staff, CM/SW, and other staff as appropriate.  Old records reviewed in assessment of this patient  Time spent: Greater than 50% of the 55 minute visit was spent in counseling/coordination of care for the patient as laid out in the A&P.   LOS: 7 days   Alm Apo, MD [facility] 01/07/2024, 3:40 PM

## 2024-01-07 NOTE — Progress Notes (Signed)
Physical Therapy Treatment Patient Details Name: Steven Blair MRN: [id number] DOB: 01-Jan-1946 Today's Date: 01/07/2024   History of Present Illness Steven Blair is a 78 y.o. male  who was swimming today after missing he slipped on a wet floor and fell on his left hip.  He has had bilateral hip replacement and previous surgeries before.  He has rod and screws there.  He immediately felt severe pain on the left side.  Did not hit his head. Ortho note:  Acute mildly displaced periprosthetic fracture involving the trochanteric region of the left femur. Based on the stability of his femoral component he can be weightbearing as tolerated through his left lower extremity.  Physical therapy should work with him on mobility restrictions including no active abduction. PMH: B THA, atrial fibrillation, history of DVT, dysrhythmia, previous fracture RIGHT femur, essential hypertension, peripheral vascular disease, ulcerative colitis    PT Comments  PT - Cognition Comments: Pt AxO x 3 pleasant and highly motivated.  Prior to his fall at the pool [facility]) he was IND amb, driving and living home with Spouse. Assisted OOB was difficult.  General bed mobility comments: increased time, assist for LE management to avoid active hip ABD.  General transfer comment: from a VERY elevated bed and + 2 side by side assist, Pt was able to rise onto B platform EVA walker. General Gait Details: Pt was able to amb 12 feet with heavy lean on B platform EVA walker and a third assist following with recliner.  Pt was able to self advance L LE, take small steps. Distance limited by increased pain, effort. Returned to room in recliner then performed a few L LE TE's mostly AAROM followed by ICE. LPT has rec Pt will need ST Rehab at SNF to address mobility and functional decline prior to safely returning home.  Awaiting Insurance approval.    If plan is discharge home, recommend the following: Two people to help with walking and/or  transfers;A lot of help with bathing/dressing/bathroom;Assistance with cooking/housework;Assist for transportation;Help with stairs or ramp for entrance   Can travel by private vehicle     No  Equipment Recommendations  Rolling walker (2 wheels)    Recommendations for Other Services       Precautions / Restrictions Precautions Precautions: Fall Precaution/Restrictions Comments: NO ACTIVE ABD L LE Restrictions Weight Bearing Restrictions Per Provider Order: No LLE Weight Bearing Per Provider Order: Weight bearing as tolerated     Mobility  Bed Mobility Overal bed mobility: Needs Assistance Bed Mobility: Supine to Sit     Supine to sit: +2 for physical assistance, +2 for safety/equipment, Mod assist, HOB elevated, Used rails     General bed mobility comments: increased time, assist for LE management to avoid active hip ABD    Transfers Overall transfer level: Needs assistance Equipment used: Rolling walker (2 wheels) Transfers: Sit to/from Stand Sit to Stand: +2 physical assistance, +2 safety/equipment, Max assist, Mod assist Stand pivot transfers: Mod assist, +2 physical assistance, +2 safety/equipment, From elevated surface         General transfer comment: from a VERY elevated bed and + 2 side by side assist, Pt was able to rise onto B platform EVA walker.    Ambulation/Gait Ambulation/Gait assistance: Mod assist, +2 physical assistance, +2 safety/equipment Gait Distance (Feet): 12 Feet Assistive device: Elyn Finder Gait Pattern/deviations: Step-to pattern, Decreased step length - right, Decreased step length - left, Shuffle, Trunk flexed Gait velocity: decr     General  Gait Details: Pt was able to amb 12 feet with heavy lean on B platform EVA walker and a third assist following with recliner.  Pt was able to self advance L LE, take small steps. Distance limited by increased pain, effort.   Stairs             Wheelchair Mobility     Tilt Bed     Modified Rankin (Stroke Patients Only)       Balance                                            Communication    Cognition Arousal: Alert Behavior During Therapy: WFL for tasks assessed/performed   PT - Cognitive impairments: No apparent impairments                       PT - Cognition Comments: Pt AxO x 3 pleasant and highly motivated.  Prior to his fall at the pool [facility]) he was IND amb, driving and living home with Spouse.        Cueing    Exercises   B LE AP x 2- reps   L LE LAQ's x 10 5 sec hold   B knee presses   B gluteal squeezes    General Comments        Pertinent Vitals/Pain Pain Assessment Pain Assessment: 0-10 Pain Score: 6  Pain Location: L hip Pain Descriptors / Indicators: Grimacing, Tender Pain Intervention(s): Monitored during session, Premedicated before session, Repositioned    Home Living                          Prior Function            PT Goals (current goals can now be found in the care plan section) Progress towards PT goals: Progressing toward goals    Frequency    Min 4X/week      PT Plan      Co-evaluation              AM-PAC PT 6 Clicks Mobility   Outcome Measure  Help needed turning from your back to your side while in a flat bed without using bedrails?: A Lot Help needed moving from lying on your back to sitting on the side of a flat bed without using bedrails?: A Lot Help needed moving to and from a bed to a chair (including a wheelchair)?: A Lot Help needed standing up from a chair using your arms (e.g., wheelchair or bedside chair)?: A Lot Help needed to walk in hospital room?: A Lot Help needed climbing 3-5 steps with a railing? : Total 6 Click Score: 11    End of Session Equipment Utilized During Treatment: Gait belt Activity Tolerance: Patient limited by fatigue;Patient limited by pain Patient left: in chair;with call bell/phone within reach;with  chair alarm set Nurse Communication: Mobility status PT Visit Diagnosis: Difficulty in walking, not elsewhere classified (R26.2)     Time: 8985-8961 PT Time Calculation (min) (ACUTE ONLY): 24 min  Charges:    $Gait Training: 8-22 mins $Therapeutic Activity: 8-22 mins PT General Charges $$ ACUTE PT VISIT: 1 Visit                     Katheryn Leap  PTA Acute  Rehabilitation Services Office M-F  [phone] ° ° ° °

## 2024-01-07 NOTE — TOC Progression Note (Addendum)
 Transition of Care [facility]) - Progression Note    Patient Details  Name: Steven Blair MRN: [id number] Date of Birth: 1945-12-31  [facility] CM/SW Contact  NORMAN ASPEN, LCSW Phone Number: 01/07/2024, 12:53 PM  Clinical Narrative:     ADDENDUM: insurance authorization approved and pt can admit to [facility] tomorrow.  Pt/wife and MD/ RN all aware.  Per facility, insurance authorization remains pending.  Pt and wife aware.   Expected Discharge Plan: Skilled Nursing Facility Barriers to Discharge: English As A Second Language Teacher, SNF Pending bed offer               Expected Discharge Plan and Services In-house Referral: Clinical Social Work   Post Acute Care Choice: Skilled Nursing Facility Living arrangements for the past 2 months: Single Family Home                 DME Arranged: N/A DME Agency: NA                   Social Drivers of Health (SDOH) Interventions SDOH Screenings   Food Insecurity: No Food Insecurity (12/31/2023)  Housing: Low Risk (12/31/2023)  Transportation Needs: No Transportation Needs (12/31/2023)  Utilities: Not At Risk (12/31/2023)  Social Connections: Moderately Isolated (12/31/2023)  Tobacco Use: Low Risk (01/01/2024)    Readmission Risk Interventions    01/02/2024    2:13 PM  Readmission Risk Prevention Plan  Post Dischage Appt Complete  Medication Screening Complete  Transportation Screening Complete

## 2024-01-07 NOTE — Progress Notes (Signed)
 Physical Therapy Treatment Patient Details Name: Steven Blair MRN: [id number] DOB: 07/24/1945 Today's Date: 01/07/2024   History of Present Illness Steven Blair is a 78 y.o. male  who was swimming today after missing he slipped on a wet floor and fell on his left hip.  He has had bilateral hip replacement and previous surgeries before.  He has rod and screws there.  He immediately felt severe pain on the left side.  Did not hit his head. Ortho note:  Acute mildly displaced periprosthetic fracture involving the trochanteric region of the left femur. Based on the stability of his femoral component he can be weightbearing as tolerated through his left lower extremity.  Physical therapy should work with him on mobility restrictions including no active abduction. PMH: B THA, atrial fibrillation, history of DVT, dysrhythmia, previous fracture RIGHT femur, essential hypertension, peripheral vascular disease, ulcerative colitis    PT Comments  Pt requesting assistance back to bed. General transfer comment: Required nearly Total Assist + 2 to rise from lower recliner chair level onto B platform EVA walker.  General Gait Details: assisted from recliner back to bed + 2 Max Assist using EVA walker with HEAVY lean and great difficulty advancing either LE. General bed mobility comments: required Max Assis + 2 back to bed with full support B LE at the same time then position to comfort with multiple pillows.  Pt AxO x 3 pleasant and highly motivated.  Prior to his fall at the pool [facility]) he was IND amb, driving and living home with Spouse. LPT has rec Pt will need ST Rehab at SNF to address mobility and functional decline prior to safely returning home.    If plan is discharge home, recommend the following: Two people to help with walking and/or transfers;A lot of help with bathing/dressing/bathroom;Assistance with cooking/housework;Assist for transportation;Help with stairs or ramp for entrance    Can travel by private vehicle     No  Equipment Recommendations  Rolling walker (2 wheels)    Recommendations for Other Services       Precautions / Restrictions Precautions Precautions: Fall Precaution/Restrictions Comments: NO ACTIVE ABD L LE Restrictions Weight Bearing Restrictions Per Provider Order: No LLE Weight Bearing Per Provider Order: Weight bearing as tolerated     Mobility  Bed Mobility Overal bed mobility: Needs Assistance Bed Mobility: Sit to Supine     Supine to sit: +2 for physical assistance, +2 for safety/equipment, Mod assist, HOB elevated, Used rails Sit to supine: Max assist   General bed mobility comments: required Max Assis + 2 back to bed with full support B LE at the same time then position to comfort with multiple pillows.    Transfers Overall transfer level: Needs assistance Equipment used: Rolling walker (2 wheels) Transfers: Sit to/from Stand Sit to Stand: Max assist, Total assist, +2 physical assistance, +2 safety/equipment Stand pivot transfers: Mod assist, +2 physical assistance, +2 safety/equipment, From elevated surface         General transfer comment: Required nearly Total Assist + 2 to rise from lower recliner chair level onto B platform EVA walker    Ambulation/Gait Ambulation/Gait assistance: Mod assist, +2 physical assistance, +2 safety/equipment Gait Distance (Feet): 2 Feet Assistive device: Elyn Finder Gait Pattern/deviations: Step-to pattern, Decreased step length - right, Decreased step length - left, Shuffle, Trunk flexed Gait velocity: decr     General Gait Details: assisted from recliner back to bed + 2 Max Assist using EVA walker with HEAVY lean and great  difficulty advancing either LE.   Stairs             Wheelchair Mobility     Tilt Bed    Modified Rankin (Stroke Patients Only)       Balance                                            Communication    Cognition  Arousal: Alert Behavior During Therapy: WFL for tasks assessed/performed   PT - Cognitive impairments: No apparent impairments                       PT - Cognition Comments: Pt AxO x 3 pleasant and highly motivated.  Prior to his fall at the pool [facility]) he was IND amb, driving and living home with Spouse.        Cueing    Exercises      General Comments        Pertinent Vitals/Pain Pain Assessment Pain Assessment: 0-10 Pain Score: 6  Pain Location: L hip Pain Descriptors / Indicators: Grimacing, Tender Pain Intervention(s): Monitored during session, Premedicated before session, Repositioned    Home Living                          Prior Function            PT Goals (current goals can now be found in the care plan section) Progress towards PT goals: Progressing toward goals    Frequency    Min 4X/week      PT Plan      Co-evaluation              AM-PAC PT 6 Clicks Mobility   Outcome Measure  Help needed turning from your back to your side while in a flat bed without using bedrails?: A Lot Help needed moving from lying on your back to sitting on the side of a flat bed without using bedrails?: A Lot Help needed moving to and from a bed to a chair (including a wheelchair)?: A Lot Help needed standing up from a chair using your arms (e.g., wheelchair or bedside chair)?: A Lot Help needed to walk in hospital room?: A Lot Help needed climbing 3-5 steps with a railing? : Total 6 Click Score: 11    End of Session Equipment Utilized During Treatment: Gait belt Activity Tolerance: Patient limited by fatigue;Patient limited by pain Patient left: in bed;with call bell/phone within reach;with bed alarm set Nurse Communication: Mobility status PT Visit Diagnosis: Difficulty in walking, not elsewhere classified (R26.2)     Time: 8590-8565 PT Time Calculation (min) (ACUTE ONLY): 25 min  Charges:    $Gait Training: 8-22  mins $Therapeutic Activity: 8-22 mins PT General Charges $$ ACUTE PT VISIT: 1 Visit                     Katheryn Leap  PTA Acute  Rehabilitation Services Office M-F          [phone]

## 2024-01-08 DIAGNOSIS — S72002A Fracture of unspecified part of neck of left femur, initial encounter for closed fracture: Secondary | ICD-10-CM | POA: Diagnosis not present

## 2024-01-08 NOTE — Progress Notes (Signed)
 Physical Therapy Treatment Patient Details Name: LOGIN MUCKLEROY MRN: [id number] DOB: 12/09/1945 Today's Date: 01/08/2024   History of Present Illness Steven Blair is a 78 y.o. male  who was swimming today after missing he slipped on a wet floor and fell on his left hip.  He has had bilateral hip replacement and previous surgeries before.  He has rod and screws there.  He immediately felt severe pain on the left side.  Did not hit his head. Ortho note:  Acute mildly displaced periprosthetic fracture involving the trochanteric region of the left femur. Based on the stability of his femoral component he can be weightbearing as tolerated through his left lower extremity.  Physical therapy should work with him on mobility restrictions including no active abduction. PMH: B THA, atrial fibrillation, history of DVT, dysrhythmia, previous fracture RIGHT femur, essential hypertension, peripheral vascular disease, ulcerative colitis    PT Comments  Pt seated on BSC for a BM.  Assisted NT with standing using STEDY, assisted with hygiene/peri care/wash up then back to bed.  General bed mobility comments: required Max Assis + 2 back to bed with full support B LE at the same time then position to comfort with multiple pillows.   Pt has IND AUTH to D/C to SNF today.     If plan is discharge home, recommend the following: Two people to help with walking and/or transfers;A lot of help with bathing/dressing/bathroom;Assistance with cooking/housework;Assist for transportation;Help with stairs or ramp for entrance   Can travel by private vehicle     No  Equipment Recommendations  Other (comment)    Recommendations for Other Services       Precautions / Restrictions Precautions Precautions: Fall Precaution/Restrictions Comments: NO ACTIVE ABD L LE Restrictions Weight Bearing Restrictions Per Provider Order: No LLE Weight Bearing Per Provider Order: Weight bearing as tolerated     Mobility  Bed  Mobility Overal bed mobility: Needs Assistance Bed Mobility: Sit to Supine       Sit to supine: Min assist, +2 for physical assistance, +2 for safety/equipment   General bed mobility comments: required Max Assis + 2 back to bed with full support B LE at the same time then position to comfort with multiple pillows.    Transfers Overall transfer level: Needs assistance Equipment used: Rolling walker (2 wheels) Transfers: Sit to/from Stand Sit to Stand: Max assist, Total assist, +2 physical assistance, +2 safety/equipment           General transfer comment: Required + 2 Max Assist to rise from [facility].  Assisted with peri care/hygiene after BM.    Ambulation/Gait               General Gait Details: transfer only this session   Stairs             Wheelchair Mobility     Tilt Bed    Modified Rankin (Stroke Patients Only)       Balance                                            Communication Communication Communication: No apparent difficulties  Cognition Arousal: Alert Behavior During Therapy: WFL for tasks assessed/performed   PT - Cognitive impairments: No apparent impairments                       PT -  Cognition Comments: Pt AxO x 3 pleasant and highly motivated.  Prior to his fall at the pool [facility]) he was IND amb, driving and living home with Spouse. Following commands: Intact      Cueing Cueing Techniques: Verbal cues  Exercises      General Comments        Pertinent Vitals/Pain Pain Assessment Pain Assessment: Faces Faces Pain Scale: Hurts little more Pain Location: L hip with acvtivity Pain Descriptors / Indicators: Grimacing, Tender Pain Intervention(s): Monitored during session, Repositioned    Home Living                          Prior Function            PT Goals (current goals can now be found in the care plan section) Progress towards PT goals: Progressing toward goals     Frequency    Min 4X/week      PT Plan      Co-evaluation              AM-PAC PT 6 Clicks Mobility   Outcome Measure    Help needed moving from lying on your back to sitting on the side of a flat bed without using bedrails?: A Lot   Help needed standing up from a chair using your arms (e.g., wheelchair or bedside chair)?: A Lot Help needed to walk in hospital room?: A Lot Help needed climbing 3-5 steps with a railing? : Total 6 Click Score: 7    End of Session Equipment Utilized During Treatment: Gait belt Activity Tolerance: Patient limited by fatigue;Patient limited by pain Patient left: in bed;with call bell/phone within reach;with bed alarm set Nurse Communication: Mobility status PT Visit Diagnosis: Difficulty in walking, not elsewhere classified (R26.2)     Time: 8997-8971 PT Time Calculation (min) (ACUTE ONLY): 26 min  Charges:    $Therapeutic Activity: 23-37 mins PT General Charges $$ ACUTE PT VISIT: 1 Visit                     Katheryn Leap  PTA Acute  Rehabilitation Services Office M-F          [phone]

## 2024-01-08 NOTE — Progress Notes (Signed)
 Called report to the nurse at [facility]. AVS placed in patient package with printed prescriptions for PTAR.

## 2024-01-08 NOTE — TOC Transition Note (Signed)
 Transition of Care [facility]) - Discharge Note   Patient Details  Name: Steven Blair MRN: [id number] Date of Birth: 01-Jan-1946  Transition of Care [facility]) CM/SW Contact:  NORMAN ASPEN, LCSW Phone Number: 01/08/2024, 12:32 PM   Clinical Narrative:     Pt cleared for dc to [facility] Rehab today. Pt and wife aware/ agreeable.  PTAR called at 12:25pm.  RN to call report to [phone].  No further IP CM needs.  Final next level of care: Skilled Nursing Facility Barriers to Discharge: Barriers Resolved   Patient Goals and CMS Choice Patient states their goals for this hospitalization and ongoing recovery are:: short term rehab then home          Discharge Placement   Existing PASRR number confirmed : 01/02/24          Patient chooses bed at: [facility] Patient to be transferred to facility by: PTAR Name of family member notified: wife Patient and family notified of of transfer: 01/08/24  Discharge Plan and Services Additional resources added to the After Visit Summary for   In-house Referral: Clinical Social Work   Post Acute Care Choice: Skilled Nursing Facility          DME Arranged: N/A DME Agency: NA                  Social Drivers of Health (SDOH) Interventions SDOH Screenings   Food Insecurity: No Food Insecurity (12/31/2023)  Housing: Low Risk (12/31/2023)  Transportation Needs: No Transportation Needs (12/31/2023)  Utilities: Not At Risk (12/31/2023)  Social Connections: Moderately Isolated (12/31/2023)  Tobacco Use: Low Risk (01/01/2024)     Readmission Risk Interventions    01/02/2024    2:13 PM  Readmission Risk Prevention Plan  Post Dischage Appt Complete  Medication Screening Complete  Transportation Screening Complete

## 2024-01-08 NOTE — Discharge Summary (Signed)
 Physician Discharge Summary   Steven Blair FMW:[id number] DOB: 31-Oct-1945 DOA: 12/31/2023  PCP: Charlott Dorn LABOR, MD  Admit date: 12/31/2023 Discharge date: 01/08/2024  Admitted From: Home Disposition:  [facility] Discharging physician: Alm Apo, MD Barriers to discharge: none  Recommendations at discharge: Follow up with orthopedic surgery for repeat images   Discharge Condition: stable CODE STATUS: Full Diet recommendation:  Diet Orders (From admission, onward)     Start     Ordered   01/08/24 0000  Diet - low sodium heart healthy        01/08/24 1059   01/01/24 0906  Diet Heart Room service appropriate? Yes; Fluid consistency: Thin  Diet effective now       Question Answer Comment  Room service appropriate? Yes   Fluid consistency: Thin      01/01/24 0905            Hospital Course: [name] is a 78 year old male with history of permanent atrial fibrillation, DVT, hypertension, peripheral vascular disease, ulcerative colitis who fell on the wet floor of the locker room at [facility].  He fell on his left hip and immediately developed pain. No head injury. Imaging showed periprosthetic left femoral neck fracture.  He has a history of femur fracture s/p repair and revision by Dr. Ernie.  Patient was unable to bear weight on presentation.  Orthopedics consulted here.  Plan for nonoperative management.  PT/OT consulted. SNF recommended.    Assessment & Plan:   Acute left periprosthetic fracture of the left femur -Presented with mechanical fall.  Continue pain management, supportive care.  Orthopedics not planning for surgery.  Plan for nonoperative management.  Orthopedics recommended no active abduction.  PT/OT consulted.  Continue bowel regimen - Awaiting SNF placement.  Otherwise stable - Outpatient follow-up with orthopedic surgery in 2 to 4 weeks for repeat images   Permanent A-fib -Heart rate controlled.  Follows with cardiology.  On Cardizem  for rate  control at home. Takes Pradaxa  for anticoagulation  Dehydration - resolved  -Urine color change noted to brownish in color morning of 12/17; patient had been ingesting increased amounts of soda -Labs checked and mildly elevated BUN but normal creatinine and no change in hemoglobin -Recommended to increase oral intake with water  and other liquids non-soda based   History of DVT: On Pradaxa    Hyperlipidemia:On crestor    History of ulcerative colitis: Takes mesalamine .  Currently not on exacerbation   Hypertension: Currently blood pressure stable   OSA: Continue CPAP at night   Obesity: BMI 31.7    Principal Diagnosis: Hip fracture [facility])  Discharge Diagnoses: Active Hospital Problems   Diagnosis Date Noted   Hip fracture (HCC) 12/31/2023   Obese 08/20/2019   Atrial fibrillation (HCC) 10/14/2018   History of DVT of lower extremity 10/31/2014   Hypertension    Sleep apnea     Resolved Hospital Problems  No resolved problems to display.     Discharge Instructions     Diet - low sodium heart healthy   Complete by: As directed    Increase activity slowly   Complete by: As directed    No wound care   Complete by: As directed       Allergies as of 01/08/2024       Reactions   Sulfa Antibiotics Other (See Comments)   Childhood allergy        Medication List     STOP taking these medications    cephALEXin  500 MG capsule Commonly known  as: KEFLEX        TAKE these medications    dabigatran  150 MG Caps capsule Commonly known as: PRADAXA  Take 1 capsule (150 mg total) by mouth 2 (two) times daily.   diltiazem  120 MG 24 hr capsule Commonly known as: CARDIZEM  CD Take 1 capsule (120 mg total) by mouth daily.   mesalamine  1.2 g EC tablet Commonly known as: LIALDA  Take 2.4 g by mouth daily.   oxyCODONE  5 MG immediate release tablet Commonly known as: Oxy IR/ROXICODONE  Take 0.5-1 tablets (2.5-5 mg total) by mouth every 6 (six) hours as needed for severe  pain (pain score 7-10).   rosuvastatin  10 MG tablet Commonly known as: CRESTOR  Take 10 mg by mouth daily.   Vitamin D3 50 MCG (2000 UT) capsule Take 2,000 Units by mouth daily.        Contact information for follow-up providers     Ernie Cough, MD Follow up in 4 week(s).   Specialty: Orthopedic Surgery Why: for radiographic follow up of left hip Contact information: [address] [place] [address] [phone]              Contact information for after-discharge care     Destination     [facility] [place], [place] .   Service: Skilled Nursing Contact information: [address] [place]  [address] [phone]                    Allergies[1]  Consultations: Ortho  Procedures:   Discharge Exam: BP 119/66 (BP Location: Left Arm)   Pulse 90   Temp 97.8 F (36.6 C) (Oral)   Resp 17   Ht 6' (1.829 m)   Wt 106.1 kg   SpO2 97%   BMI 31.74 kg/m  Physical Exam Constitutional:      General: He is not in acute distress.    Appearance: Normal appearance.  HENT:     Head: Normocephalic and atraumatic.     Mouth/Throat:     Mouth: Mucous membranes are moist.  Eyes:     Extraocular Movements: Extraocular movements intact.  Cardiovascular:     Rate and Rhythm: Normal rate and regular rhythm.  Pulmonary:     Effort: Pulmonary effort is normal. No respiratory distress.     Breath sounds: Normal breath sounds. No wheezing.  Abdominal:     General: Bowel sounds are normal. There is no distension.     Palpations: Abdomen is soft.     Tenderness: There is no abdominal tenderness.  Musculoskeletal:     Cervical back: Normal range of motion and neck supple.     Right lower leg: No edema.     Left lower leg: No edema.  Skin:    General: Skin is warm and dry.  Neurological:     General: No focal deficit present.     Mental Status: He is alert.  Psychiatric:        Mood and Affect: Mood normal.         Behavior: Behavior normal.      The results of significant diagnostics from this hospitalization (including imaging, microbiology, ancillary and laboratory) are listed below for reference.   Microbiology: No results found for this or any previous visit (from the past 240 hours).   Labs: BNP (last 3 results) No results for input(s): BNP in the last 8760 hours. Basic Metabolic Panel: Recent Labs  Lab 01/02/24 0322 01/07/24 1235  NA 136 135  K 4.9  4.8  CL 101 95*  CO2 29 30  GLUCOSE 112* 114*  BUN 11 25*  CREATININE 0.64 0.89  CALCIUM  8.9 9.5   Liver Function Tests: Recent Labs  Lab 01/02/24 0322  AST 39  ALT 40  ALKPHOS 76  BILITOT 0.8  PROT 6.3*  ALBUMIN 3.7   No results for input(s): LIPASE, AMYLASE in the last 168 hours. No results for input(s): AMMONIA in the last 168 hours. CBC: Recent Labs  Lab 01/02/24 0322 01/07/24 1235  WBC 8.9 8.2  NEUTROABS  --  6.4  HGB 13.5 15.2  HCT 41.3 45.0  MCV 108.1* 105.1*  PLT 106* 192   Cardiac Enzymes: No results for input(s): CKTOTAL, CKMB, CKMBINDEX, TROPONINI in the last 168 hours. BNP: Invalid input(s): POCBNP CBG: No results for input(s): GLUCAP in the last 168 hours. D-Dimer No results for input(s): DDIMER in the last 72 hours. Hgb A1c No results for input(s): HGBA1C in the last 72 hours. Lipid Profile No results for input(s): CHOL, HDL, LDLCALC, TRIG, CHOLHDL, LDLDIRECT in the last 72 hours. Thyroid function studies No results for input(s): TSH, T4TOTAL, T3FREE, THYROIDAB in the last 72 hours.  Invalid input(s): FREET3 Anemia work up No results for input(s): VITAMINB12, FOLATE, FERRITIN, TIBC, IRON, RETICCTPCT in the last 72 hours. Urinalysis    Component Value Date/Time   COLORURINE YELLOW 03/01/2019 1550   APPEARANCEUR CLEAR 03/01/2019 1550   LABSPEC 1.014 03/01/2019 1550   PHURINE 7.0 03/01/2019 1550   GLUCOSEU NEGATIVE 03/01/2019 1550    HGBUR LARGE (A) 03/01/2019 1550   BILIRUBINUR NEGATIVE 03/01/2019 1550   KETONESUR NEGATIVE 03/01/2019 1550   PROTEINUR NEGATIVE 03/01/2019 1550   UROBILINOGEN 0.2 11/08/2014 1440   NITRITE NEGATIVE 03/01/2019 1550   LEUKOCYTESUR NEGATIVE 03/01/2019 1550   Sepsis Labs Recent Labs  Lab 01/02/24 0322 01/07/24 1235  WBC 8.9 8.2   Microbiology No results found for this or any previous visit (from the past 240 hours).  Procedures/Studies: DG FEMUR 1V LEFT Result Date: 12/31/2023 CLINICAL DATA:  Fall EXAM: LEFT FEMUR 1 VIEW COMPARISON:  None Available. FINDINGS: Status post left hip replacement with normal alignment. Surgical plate, fixating screws and cerclage wires at the shaft of the femur. Acute mildly displaced periprosthetic fracture involving the trochanteric region of left femur. Vascular calcifications. IMPRESSION: Status post left hip replacement, surgical plate, fixating screws and cerclage wires of left femur with interval acute mildly displaced periprosthetic fracture involving the trochanteric region of the left femur. Electronically Signed   By: Luke Bun M.D.   On: 12/31/2023 16:47   DG Hip Unilat With Pelvis 2-3 Views Left Result Date: 12/31/2023 CLINICAL DATA:  Fall EXAM: DG HIP (WITH OR WITHOUT PELVIS) 2-3V LEFT COMPARISON:  03/01/2019 FINDINGS: SI joints are non widened. Pubic symphysis and rami appear intact. Bilateral hip replacements with normal alignment. Surgical plate, screw and cerclage wire fixation of the femoral shaft across old fracture deformity. Interval acute mildly displaced periprosthetic fracture involving the trochanteric region of the left femur. Vascular calcifications. IMPRESSION: Acute mildly displaced periprosthetic fracture involving the trochanteric region of the left femur. Electronically Signed   By: Luke Bun M.D.   On: 12/31/2023 16:46     Time coordinating discharge: Over 30 minutes    Alm Apo, MD  [facility] 01/08/2024, 11:00 AM    [1]  Allergies Allergen Reactions   Sulfa Antibiotics Other (See Comments)    Childhood allergy

## 2024-02-02 ENCOUNTER — Other Ambulatory Visit (HOSPITAL_BASED_OUTPATIENT_CLINIC_OR_DEPARTMENT_OTHER): Payer: Self-pay

## 2024-02-16 ENCOUNTER — Other Ambulatory Visit (HOSPITAL_BASED_OUTPATIENT_CLINIC_OR_DEPARTMENT_OTHER): Payer: Self-pay

## 2024-02-27 ENCOUNTER — Other Ambulatory Visit (HOSPITAL_BASED_OUTPATIENT_CLINIC_OR_DEPARTMENT_OTHER): Payer: Self-pay

## 2024-03-31 ENCOUNTER — Ambulatory Visit (HOSPITAL_BASED_OUTPATIENT_CLINIC_OR_DEPARTMENT_OTHER): Admitting: Cardiovascular Disease
